# Patient Record
Sex: Female | Born: 1935
Health system: Southern US, Community
[De-identification: ages and names within clinical notes are randomized; demographics above are authoritative.]

## PROBLEM LIST (undated history)

## (undated) DIAGNOSIS — J45909 Unspecified asthma, uncomplicated: Secondary | ICD-10-CM

## (undated) DIAGNOSIS — T466X5A Adverse effect of antihyperlipidemic and antiarteriosclerotic drugs, initial encounter: Secondary | ICD-10-CM

## (undated) DIAGNOSIS — R0602 Shortness of breath: Secondary | ICD-10-CM

## (undated) DIAGNOSIS — J439 Emphysema, unspecified: Secondary | ICD-10-CM

## (undated) DIAGNOSIS — K219 Gastro-esophageal reflux disease without esophagitis: Secondary | ICD-10-CM

## (undated) DIAGNOSIS — N183 Chronic kidney disease, stage 3 unspecified: Secondary | ICD-10-CM

## (undated) DIAGNOSIS — K3184 Gastroparesis: Secondary | ICD-10-CM

## (undated) DIAGNOSIS — M791 Myalgia, unspecified site: Secondary | ICD-10-CM

## (undated) DIAGNOSIS — E7800 Pure hypercholesterolemia, unspecified: Secondary | ICD-10-CM

## (undated) DIAGNOSIS — Z8719 Personal history of other diseases of the digestive system: Secondary | ICD-10-CM

## (undated) DIAGNOSIS — E039 Hypothyroidism, unspecified: Secondary | ICD-10-CM

## (undated) DIAGNOSIS — J309 Allergic rhinitis, unspecified: Secondary | ICD-10-CM

## (undated) DIAGNOSIS — M1990 Unspecified osteoarthritis, unspecified site: Secondary | ICD-10-CM

## (undated) DIAGNOSIS — R0600 Dyspnea, unspecified: Secondary | ICD-10-CM

## (undated) DIAGNOSIS — J449 Chronic obstructive pulmonary disease, unspecified: Secondary | ICD-10-CM

## (undated) HISTORY — PX: SINUS EXPLORATION: SHX5214

## (undated) HISTORY — DX: Allergic rhinitis, unspecified: J30.9

## (undated) HISTORY — PX: ABDOMINAL HYSTERECTOMY: SHX81

## (undated) HISTORY — DX: Myalgia, unspecified site: M79.10

## (undated) HISTORY — DX: Adverse effect of antihyperlipidemic and antiarteriosclerotic drugs, initial encounter: T46.6X5A

## (undated) HISTORY — DX: Chronic obstructive pulmonary disease, unspecified: J44.9

## (undated) HISTORY — PX: TONSILLECTOMY: SUR1361

## (undated) HISTORY — DX: Dyspnea, unspecified: R06.00

---

## 2013-04-01 ENCOUNTER — Encounter (HOSPITAL_COMMUNITY): Payer: Self-pay | Admitting: Pharmacy Technician

## 2013-04-07 NOTE — H&P (Signed)
Valerie Kelley is an 79 y.o. female.    Chief Complaint: left shoulder pain  HPI: Pt is a 79 y.o. female complaining of left shoulder pain for multiple years. Pain had continually increased since the beginning. X-rays in the clinic show end-stage arthritic changes of the left shoulder. Pt has tried various conservative treatments which have failed to alleviate their symptoms, including shots and therapy. Various options are discussed with the patient. Risks, benefits and expectations were discussed with the patient. Patient understand the risks, benefits and expectations and wishes to proceed with surgery.   PCP:  No primary provider on file.  D/C Plans:  Home with HHPT  PMH: No past medical history on file.  PSH: No past surgical history on file.  Social History:  has no tobacco, alcohol, and drug history on file.  Allergies:  Allergies  Allergen Reactions  . Codeine Nausea And Vomiting  . Darvocet (Propoxyphene-Acetaminophen) Nausea And Vomiting  . Prednisone Swelling and Other (See Comments)    Face swells and constant headache  . Penicillins Rash    Medications: No current facility-administered medications for this encounter.   Current Outpatient Prescriptions  Medication Sig Dispense Refill  . acetaminophen (TYLENOL) 325 MG tablet Take 325 mg by mouth every evening. For pain      . albuterol (PROVENTIL HFA;VENTOLIN HFA) 108 (90 BASE) MCG/ACT inhaler Inhale 2 puffs into the lungs every 6 (six) hours as needed for wheezing. For wheezing      . Calcium Carbonate-Vitamin D (CALCIUM + D PO) Take 1 tablet by mouth 2 (two) times daily.      . cholecalciferol (VITAMIN D) 1000 UNITS tablet Take 1,000 Units by mouth daily.      Marland Kitchen estradiol (ESTRACE) 1 MG tablet Take 1 mg by mouth daily.      . fish oil-omega-3 fatty acids 1000 MG capsule Take 2 g by mouth daily.      . flintstones complete (FLINTSTONES) 60 MG chewable tablet Chew 1 tablet by mouth daily.      . Lecithin GRAN Take  15 mLs by mouth daily.      Marland Kitchen loratadine (CLARITIN) 10 MG tablet Take 10 mg by mouth daily.      . Multiple Vitamins-Minerals (EYE VITAMINS PO) Take 1 tablet by mouth daily. Vision Support Eye Vitamin      . OVER THE COUNTER MEDICATION Take 0.5 tablets by mouth daily as needed. For pain Medication:Curamin      . OVER THE COUNTER MEDICATION Take 1 tablet by mouth daily. Medication:Thytrophin      . Polyethyl Glycol-Propyl Glycol (SYSTANE OP) Place 1 drop into both eyes daily as needed. For dry/irritated eyes      . polyethylene glycol (MIRALAX / GLYCOLAX) packet Take 51 g by mouth daily. Takes 3 capfuls daily      . progesterone (PROMETRIUM) 100 MG capsule Take 100 mg by mouth daily.      . ranitidine (ZANTAC) 150 MG tablet Take 150 mg by mouth 2 (two) times daily.      Marland Kitchen tiotropium (SPIRIVA) 18 MCG inhalation capsule Place 18 mcg into inhaler and inhale daily.      . traZODone (DESYREL) 50 MG tablet Take 50 mg by mouth at bedtime.      . vitamin E 400 UNIT capsule Take 800 Units by mouth daily.        No results found for this or any previous visit (from the past 48 hour(s)). No results found.  ROS: Pain with  rom of the left upper extremity  Physical Exam: Alert and oriented 80 y.o. female in no acute distress Cranial nerves 2-12 intact Cervical spine: full rom with no tenderness, nv intact distally Chest: active breath sounds bilaterally, no wheeze rhonchi or rales Heart: regular rate and rhythm, no murmur Abd: non tender non distended with active bowel sounds Hip is stable with rom  Left upper extremity with limited rom and weakness due to cuff insufficiency nv intact distally No rashes or edema  Assessment/Plan Assessment: left shoulder rotator cuff insufficiency  Plan: Patient will undergo a left reverse total shoulder by Dr. Veverly Fells at [facility] Risks benefits and expectations were discussed with the patient. Patient understand risks, benefits and expectations and wishes  to proceed.

## 2013-04-09 ENCOUNTER — Encounter (HOSPITAL_COMMUNITY): Payer: Self-pay

## 2013-04-09 ENCOUNTER — Encounter (HOSPITAL_COMMUNITY)
Admission: RE | Admit: 2013-04-09 | Discharge: 2013-04-09 | Disposition: A | Discharge status: Home / Self Care | Payer: Medicare Other | Source: Ambulatory Visit | Attending: Orthopedic Surgery | Admitting: Orthopedic Surgery

## 2013-04-09 ENCOUNTER — Ambulatory Visit (HOSPITAL_COMMUNITY)
Admission: RE | Admit: 2013-04-09 | Discharge: 2013-04-09 | Disposition: A | Discharge status: Home / Self Care | Payer: Medicare Other | Source: Ambulatory Visit | Attending: Orthopedic Surgery | Admitting: Orthopedic Surgery

## 2013-04-09 DIAGNOSIS — Z01810 Encounter for preprocedural cardiovascular examination: Secondary | ICD-10-CM | POA: Insufficient documentation

## 2013-04-09 DIAGNOSIS — Z01812 Encounter for preprocedural laboratory examination: Secondary | ICD-10-CM | POA: Insufficient documentation

## 2013-04-09 DIAGNOSIS — J9819 Other pulmonary collapse: Secondary | ICD-10-CM | POA: Insufficient documentation

## 2013-04-09 DIAGNOSIS — J438 Other emphysema: Secondary | ICD-10-CM | POA: Insufficient documentation

## 2013-04-09 DIAGNOSIS — Z01818 Encounter for other preprocedural examination: Secondary | ICD-10-CM | POA: Insufficient documentation

## 2013-04-09 DIAGNOSIS — R0602 Shortness of breath: Secondary | ICD-10-CM | POA: Insufficient documentation

## 2013-04-09 DIAGNOSIS — Z0183 Encounter for blood typing: Secondary | ICD-10-CM | POA: Insufficient documentation

## 2013-04-09 HISTORY — DX: Gastroparesis: K31.84

## 2013-04-09 HISTORY — DX: Unspecified osteoarthritis, unspecified site: M19.90

## 2013-04-09 HISTORY — DX: Unspecified asthma, uncomplicated: J45.909

## 2013-04-09 LAB — CBC
MCV: 90.5 fL (ref 78.0–100.0)
Platelets: 212 10*3/uL (ref 150–400)
RBC: 4.32 MIL/uL (ref 3.87–5.11)
WBC: 7.8 10*3/uL (ref 4.0–10.5)

## 2013-04-09 LAB — BASIC METABOLIC PANEL
CO2: 23 mEq/L (ref 19–32)
Calcium: 9.5 mg/dL (ref 8.4–10.5)
Chloride: 104 mEq/L (ref 96–112)
Sodium: 137 mEq/L (ref 135–145)

## 2013-04-09 LAB — TYPE AND SCREEN: Antibody Screen: NEGATIVE

## 2013-04-09 LAB — ABO/RH: ABO/RH(D): A POS

## 2013-04-09 NOTE — Pre-Procedure Instructions (Signed)
Valerie Kelley  04/09/2013   Your procedure is scheduled on:  04/15/13  Report to [place] at 1025 AM.  Call this number if you have problems the morning of surgery: [phone]   Remember:   Do not eat food or drink liquids after midnight.   Take these medicines the morning of surgery with A SIP OF WATER: all inhalers,estrace,clartin,zantac   Do not wear jewelry, make-up or nail polish.  Do not wear lotions, powders, or perfumes. You may wear deodorant.  Do not shave 48 hours prior to surgery. Men may shave face and neck.  Do not bring valuables to the hospital.  [facility] is not responsible                   for any belongings or valuables.  Contacts, dentures or bridgework may not be worn into surgery.  Leave suitcase in the car. After surgery it may be brought to your room.  For patients admitted to the hospital, checkout time is 11:00 AM the day of  discharge.   Patients discharged the day of surgery will not be allowed to drive  home.  Name and phone number of your driver: family  Special Instructions: Shower using CHG 2 nights before surgery and the night before surgery.  If you shower the day of surgery use CHG.  Use special wash - you have one bottle of CHG for all showers.  You should use approximately 1/3 of the bottle for each shower.   Please read over the following fact sheets that you were given: Pain Booklet, Coughing and Deep Breathing, Blood Transfusion Information and Surgical Site Infection Prevention

## 2013-04-10 NOTE — Progress Notes (Signed)
Anesthesia Chart Review:  Patient is a 79 year old female scheduled for left shoulder reverse shoulder arthroplasty on 04/15/13 by Dr. Veverly Fells.  History includes non-smoker, asthma, chronic SOB, arthritis, CKD stage III, gastric paresis, hysterectomy, sinus surgery.  PCP is Dr. Quintella Reichert at [facility] in [place]. He medically cleared patient with recommendations for BP and renal function monitoring.  EKGs from 01/28/13 (PCP) and 04/09/13 noted that show NSR--question of short PR on her later EKG, but some artifact is present.  PR was 0.14 on 01/28/13.  Preoperative labs noted.  BUN 36 with normal Cr of 1.01.  CBC WNL.    CXR on 04/09/13 showed: COPD changes with atelectasis and question infiltrate right middle lobe. Potential nodular density in left upper lobe; follow-up radiographs to ensure resolution or CT imaging recommended to exclude pulmonary nodule. Currently, there are no comparison CXRs. I spoke with Santiago Glad at Dr. Veverly Fells' office who notified Doren Custard, PA-C to review for further recommendations. She was afebrile, normal WBC, with O2 sat 98% at PAT.  I was not asked to evaluate her during her PAT visit.  I will defer further recommendations for follow-up to Dr. Dewaine Conger.  Clinical correlation on the day of surgery.  If no worrisome symptoms to indicate acute PNA then I would anticipate that she could proceed.    George [facility]/Anesthesiology Phone [phone] 04/10/2013 4:35 PM

## 2013-04-14 MED ORDER — VANCOMYCIN HCL IN DEXTROSE 1-5 GM/200ML-% IV SOLN
1000.0000 mg | INTRAVENOUS | Status: AC
  Administered 2013-04-15: 1000 mg via INTRAVENOUS
  Filled 2013-04-14: qty 200

## 2013-04-15 ENCOUNTER — Inpatient Hospital Stay (HOSPITAL_COMMUNITY)
Admission: RE | Admit: 2013-04-15 | Discharge: 2013-04-17 | DRG: 484 | Disposition: A | Discharge status: Home / Self Care | Payer: Medicare Other | Source: Ambulatory Visit | Attending: Orthopedic Surgery | Admitting: Orthopedic Surgery

## 2013-04-15 ENCOUNTER — Encounter (HOSPITAL_COMMUNITY): Payer: Self-pay | Admitting: *Deleted

## 2013-04-15 ENCOUNTER — Encounter (HOSPITAL_COMMUNITY): Payer: Self-pay | Admitting: Vascular Surgery

## 2013-04-15 ENCOUNTER — Inpatient Hospital Stay (HOSPITAL_COMMUNITY): Payer: Medicare Other

## 2013-04-15 ENCOUNTER — Encounter (HOSPITAL_COMMUNITY)
Admission: RE | Disposition: A | Discharge status: Home / Self Care | Payer: Self-pay | Source: Ambulatory Visit | Attending: Orthopedic Surgery

## 2013-04-15 ENCOUNTER — Inpatient Hospital Stay (HOSPITAL_COMMUNITY): Payer: Medicare Other | Admitting: Certified Registered Nurse Anesthetist

## 2013-04-15 DIAGNOSIS — Z79899 Other long term (current) drug therapy: Secondary | ICD-10-CM

## 2013-04-15 DIAGNOSIS — M25312 Other instability, left shoulder: Secondary | ICD-10-CM

## 2013-04-15 DIAGNOSIS — S43429A Sprain of unspecified rotator cuff capsule, initial encounter: Principal | ICD-10-CM | POA: Diagnosis present

## 2013-04-15 DIAGNOSIS — Z880 Allergy status to penicillin: Secondary | ICD-10-CM

## 2013-04-15 DIAGNOSIS — R110 Nausea: Secondary | ICD-10-CM | POA: Diagnosis not present

## 2013-04-15 DIAGNOSIS — M19019 Primary osteoarthritis, unspecified shoulder: Secondary | ICD-10-CM | POA: Diagnosis present

## 2013-04-15 DIAGNOSIS — X58XXXA Exposure to other specified factors, initial encounter: Secondary | ICD-10-CM | POA: Diagnosis present

## 2013-04-15 HISTORY — DX: Hypothyroidism, unspecified: E03.9

## 2013-04-15 HISTORY — DX: Gastro-esophageal reflux disease without esophagitis: K21.9

## 2013-04-15 HISTORY — DX: Emphysema, unspecified: J43.9

## 2013-04-15 HISTORY — DX: Personal history of other diseases of the digestive system: Z87.19

## 2013-04-15 HISTORY — DX: Chronic kidney disease, stage 3 (moderate): N18.3

## 2013-04-15 HISTORY — DX: Pure hypercholesterolemia, unspecified: E78.00

## 2013-04-15 HISTORY — DX: Chronic kidney disease, stage 3 unspecified: N18.30

## 2013-04-15 HISTORY — DX: Shortness of breath: R06.02

## 2013-04-15 HISTORY — PX: REVERSE SHOULDER ARTHROPLASTY: SHX5054

## 2013-04-15 SURGERY — ARTHROPLASTY, SHOULDER, TOTAL, REVERSE
Anesthesia: General | Site: Shoulder | Laterality: Left | Wound class: Clean

## 2013-04-15 MED ORDER — LORATADINE 10 MG PO TABS
10.0000 mg | ORAL_TABLET | Freq: Every day | ORAL | Status: DC
  Administered 2013-04-16: 10 mg via ORAL
  Filled 2013-04-15 (×3): qty 1

## 2013-04-15 MED ORDER — HYDROCODONE-ACETAMINOPHEN 5-325 MG PO TABS
ORAL_TABLET | ORAL | Status: AC
  Filled 2013-04-15: qty 2

## 2013-04-15 MED ORDER — PHENYLEPHRINE HCL 10 MG/ML IJ SOLN
INTRAMUSCULAR | Status: DC | PRN
  Administered 2013-04-15 (×2): 80 ug via INTRAVENOUS

## 2013-04-15 MED ORDER — CHLORHEXIDINE GLUCONATE 4 % EX LIQD: 60.0000 mL | Freq: Once | CUTANEOUS | Status: DC

## 2013-04-15 MED ORDER — ONDANSETRON HCL 4 MG/2ML IJ SOLN
INTRAMUSCULAR | Status: DC | PRN
  Administered 2013-04-15: 4 mg via INTRAVENOUS

## 2013-04-15 MED ORDER — PHENYLEPHRINE HCL 10 MG/ML IJ SOLN
10.0000 mg | INTRAVENOUS | Status: DC | PRN
  Administered 2013-04-15: 25 ug/min via INTRAVENOUS

## 2013-04-15 MED ORDER — WHITE PETROLATUM GEL
Status: AC
  Administered 2013-04-15: 17:00:00
  Filled 2013-04-15: qty 5

## 2013-04-15 MED ORDER — PROGESTERONE MICRONIZED 100 MG PO CAPS
100.0000 mg | ORAL_CAPSULE | Freq: Every day | ORAL | Status: DC
  Filled 2013-04-15 (×3): qty 1

## 2013-04-15 MED ORDER — BUPIVACAINE-EPINEPHRINE 0.25% -1:200000 IJ SOLN
INTRAMUSCULAR | Status: DC | PRN
  Administered 2013-04-15: 5 mL

## 2013-04-15 MED ORDER — FAMOTIDINE 20 MG PO TABS
20.0000 mg | ORAL_TABLET | Freq: Every day | ORAL | Status: DC
  Administered 2013-04-16 – 2013-04-17 (×2): 20 mg via ORAL
  Filled 2013-04-15 (×3): qty 1

## 2013-04-15 MED ORDER — ARTIFICIAL TEARS OP OINT
TOPICAL_OINTMENT | OPHTHALMIC | Status: DC | PRN
  Administered 2013-04-15: 1 via OPHTHALMIC

## 2013-04-15 MED ORDER — METOCLOPRAMIDE HCL 10 MG PO TABS: 5.0000 mg | ORAL_TABLET | Freq: Three times a day (TID) | ORAL | Status: DC | PRN

## 2013-04-15 MED ORDER — ONDANSETRON HCL 4 MG/2ML IJ SOLN
INTRAMUSCULAR | Status: AC
  Filled 2013-04-15: qty 2

## 2013-04-15 MED ORDER — ESTRADIOL 1 MG PO TABS
1.0000 mg | ORAL_TABLET | Freq: Every day | ORAL | Status: DC
  Filled 2013-04-15 (×3): qty 1

## 2013-04-15 MED ORDER — MORPHINE SULFATE 2 MG/ML IJ SOLN
1.0000 mg | INTRAMUSCULAR | Status: DC | PRN
  Administered 2013-04-15 – 2013-04-16 (×4): 1 mg via INTRAVENOUS
  Administered 2013-04-16: 2 mg via INTRAVENOUS
  Administered 2013-04-16: 1 mg via INTRAVENOUS
  Administered 2013-04-16: 2 mg via INTRAVENOUS
  Administered 2013-04-16: 1 mg via INTRAVENOUS
  Filled 2013-04-15 (×8): qty 1

## 2013-04-15 MED ORDER — HYDROCODONE-ACETAMINOPHEN 5-325 MG PO TABS
1.0000 | ORAL_TABLET | ORAL | Status: DC | PRN
  Administered 2013-04-15: 2 via ORAL
  Administered 2013-04-16: 1 via ORAL
  Administered 2013-04-16 – 2013-04-17 (×4): 2 via ORAL
  Filled 2013-04-15 (×5): qty 2

## 2013-04-15 MED ORDER — FENTANYL CITRATE 0.05 MG/ML IJ SOLN
INTRAMUSCULAR | Status: AC
  Administered 2013-04-15: 50 ug via INTRAVENOUS
  Filled 2013-04-15: qty 2

## 2013-04-15 MED ORDER — GLYCOPYRROLATE 0.2 MG/ML IJ SOLN
INTRAMUSCULAR | Status: DC | PRN
  Administered 2013-04-15: .6 mg via INTRAVENOUS

## 2013-04-15 MED ORDER — ONDANSETRON HCL 4 MG/2ML IJ SOLN
INTRAMUSCULAR | Status: AC
  Administered 2013-04-15: 4 mg
  Filled 2013-04-15: qty 2

## 2013-04-15 MED ORDER — LACTATED RINGERS IV SOLN
INTRAVENOUS | Status: DC
  Administered 2013-04-15: 11:00:00 via INTRAVENOUS

## 2013-04-15 MED ORDER — ONDANSETRON HCL 4 MG/2ML IJ SOLN
4.0000 mg | Freq: Once | INTRAMUSCULAR | Status: AC | PRN
  Administered 2013-04-15: 4 mg via INTRAVENOUS

## 2013-04-15 MED ORDER — MIDAZOLAM HCL 2 MG/2ML IJ SOLN
INTRAMUSCULAR | Status: AC
  Administered 2013-04-15: 1 mg via INTRAVENOUS
  Filled 2013-04-15: qty 2

## 2013-04-15 MED ORDER — TIOTROPIUM BROMIDE MONOHYDRATE 18 MCG IN CAPS
18.0000 ug | ORAL_CAPSULE | Freq: Every day | RESPIRATORY_TRACT | Status: DC
  Administered 2013-04-16: 18 ug via RESPIRATORY_TRACT
  Filled 2013-04-15: qty 5

## 2013-04-15 MED ORDER — 0.9 % SODIUM CHLORIDE (POUR BTL) OPTIME
TOPICAL | Status: DC | PRN
  Administered 2013-04-15: 1000 mL

## 2013-04-15 MED ORDER — HYDROMORPHONE HCL PF 1 MG/ML IJ SOLN
INTRAMUSCULAR | Status: AC
  Filled 2013-04-15: qty 1

## 2013-04-15 MED ORDER — METOCLOPRAMIDE HCL 5 MG/ML IJ SOLN
5.0000 mg | Freq: Three times a day (TID) | INTRAMUSCULAR | Status: DC | PRN
  Administered 2013-04-15: 10 mg via INTRAVENOUS
  Filled 2013-04-15: qty 2

## 2013-04-15 MED ORDER — TRAZODONE HCL 50 MG PO TABS
50.0000 mg | ORAL_TABLET | Freq: Every day | ORAL | Status: DC
  Filled 2013-04-15 (×3): qty 1

## 2013-04-15 MED ORDER — MIDAZOLAM HCL 2 MG/2ML IJ SOLN: 1.0000 mg | Freq: Once | INTRAMUSCULAR | Status: AC

## 2013-04-15 MED ORDER — PROPOFOL 10 MG/ML IV BOLUS
INTRAVENOUS | Status: DC | PRN
  Administered 2013-04-15: 100 mg via INTRAVENOUS

## 2013-04-15 MED ORDER — SODIUM CHLORIDE 0.9 % IV SOLN
INTRAVENOUS | Status: DC
  Administered 2013-04-15 – 2013-04-16 (×2): via INTRAVENOUS

## 2013-04-15 MED ORDER — ACETAMINOPHEN 325 MG PO TABS: 325.0000 mg | ORAL_TABLET | Freq: Every evening | ORAL | Status: DC

## 2013-04-15 MED ORDER — ONDANSETRON HCL 4 MG PO TABS: 4.0000 mg | ORAL_TABLET | Freq: Four times a day (QID) | ORAL | Status: DC | PRN

## 2013-04-15 MED ORDER — FENTANYL CITRATE 0.05 MG/ML IJ SOLN: 50.0000 ug | Freq: Once | INTRAMUSCULAR | Status: AC

## 2013-04-15 MED ORDER — LIDOCAINE HCL (CARDIAC) 20 MG/ML IV SOLN
INTRAVENOUS | Status: DC | PRN
  Administered 2013-04-15: 40 mg via INTRAVENOUS

## 2013-04-15 MED ORDER — PHENOL 1.4 % MT LIQD: 1.0000 | OROMUCOSAL | Status: DC | PRN

## 2013-04-15 MED ORDER — ONDANSETRON HCL 4 MG/2ML IJ SOLN
4.0000 mg | Freq: Four times a day (QID) | INTRAMUSCULAR | Status: DC | PRN
  Administered 2013-04-15 – 2013-04-17 (×5): 4 mg via INTRAVENOUS
  Filled 2013-04-15 (×6): qty 2

## 2013-04-15 MED ORDER — LACTATED RINGERS IV SOLN
INTRAVENOUS | Status: DC | PRN
  Administered 2013-04-15 (×2): via INTRAVENOUS

## 2013-04-15 MED ORDER — POLYETHYLENE GLYCOL 3350 17 G PO PACK
51.0000 g | PACK | Freq: Every day | ORAL | Status: DC
  Filled 2013-04-15 (×3): qty 3

## 2013-04-15 MED ORDER — FENTANYL CITRATE 0.05 MG/ML IJ SOLN
INTRAMUSCULAR | Status: DC | PRN
  Administered 2013-04-15: 100 ug via INTRAVENOUS

## 2013-04-15 MED ORDER — VITAMIN D3 25 MCG (1000 UNIT) PO TABS
1000.0000 [IU] | ORAL_TABLET | Freq: Every day | ORAL | Status: DC
  Filled 2013-04-15 (×3): qty 1

## 2013-04-15 MED ORDER — ALBUTEROL SULFATE HFA 108 (90 BASE) MCG/ACT IN AERS
2.0000 | INHALATION_SPRAY | Freq: Four times a day (QID) | RESPIRATORY_TRACT | Status: DC | PRN
  Administered 2013-04-16: 2 via RESPIRATORY_TRACT
  Filled 2013-04-15: qty 6.7

## 2013-04-15 MED ORDER — ACETAMINOPHEN 650 MG RE SUPP: 650.0000 mg | Freq: Four times a day (QID) | RECTAL | Status: DC | PRN

## 2013-04-15 MED ORDER — NEOSTIGMINE METHYLSULFATE 1 MG/ML IJ SOLN
INTRAMUSCULAR | Status: DC | PRN
  Administered 2013-04-15: 3 mg via INTRAVENOUS

## 2013-04-15 MED ORDER — ROCURONIUM BROMIDE 100 MG/10ML IV SOLN
INTRAVENOUS | Status: DC | PRN
  Administered 2013-04-15: 30 mg via INTRAVENOUS

## 2013-04-15 MED ORDER — ACETAMINOPHEN 325 MG PO TABS: 650.0000 mg | ORAL_TABLET | Freq: Four times a day (QID) | ORAL | Status: DC | PRN

## 2013-04-15 MED ORDER — HYDROMORPHONE HCL PF 1 MG/ML IJ SOLN
0.2500 mg | INTRAMUSCULAR | Status: DC | PRN
  Administered 2013-04-15 (×2): 0.5 mg via INTRAVENOUS

## 2013-04-15 MED ORDER — SODIUM CHLORIDE 0.9 % IV SOLN
500.0000 mg | INTRAVENOUS | Status: AC
  Administered 2013-04-15: 500 mg via INTRAVENOUS
  Filled 2013-04-15: qty 500

## 2013-04-15 MED ORDER — MENTHOL 3 MG MT LOZG: 1.0000 | LOZENGE | OROMUCOSAL | Status: DC | PRN

## 2013-04-15 MED ORDER — VITAMIN E 180 MG (400 UNIT) PO CAPS
800.0000 [IU] | ORAL_CAPSULE | Freq: Every day | ORAL | Status: DC
  Filled 2013-04-15 (×3): qty 2

## 2013-04-15 MED ORDER — LIDOCAINE HCL 4 % MT SOLN
OROMUCOSAL | Status: DC | PRN
  Administered 2013-04-15: 4 mL via TOPICAL

## 2013-04-15 MED ORDER — BUPIVACAINE-EPINEPHRINE PF 0.25-1:200000 % IJ SOLN
INTRAMUSCULAR | Status: AC
  Filled 2013-04-15: qty 30

## 2013-04-15 SURGICAL SUPPLY — 57 items
BLADE SAG 18X100X1.27 (BLADE) ×2 IMPLANT
CAPT SHOULD DELTAXTEND CEM MOD ×2 IMPLANT
CLOTH BEACON ORANGE TIMEOUT ST (SAFETY) ×2 IMPLANT
CLSR STERI-STRIP ANTIMIC 1/2X4 (GAUZE/BANDAGES/DRESSINGS) ×2 IMPLANT
COVER SURGICAL LIGHT HANDLE (MISCELLANEOUS) ×2 IMPLANT
DRAPE INCISE IOBAN 66X45 STRL (DRAPES) ×6 IMPLANT
DRAPE U-SHAPE 47X51 STRL (DRAPES) ×2 IMPLANT
DRAPE X-RAY CASS 24X20 (DRAPES) IMPLANT
DRSG ADAPTIC 3X8 NADH LF (GAUZE/BANDAGES/DRESSINGS) ×2 IMPLANT
DRSG PAD ABDOMINAL 8X10 ST (GAUZE/BANDAGES/DRESSINGS) ×2 IMPLANT
DURAPREP 26ML APPLICATOR (WOUND CARE) ×2 IMPLANT
ELECT BLADE 4.0 EZ CLEAN MEGAD (MISCELLANEOUS) ×2
ELECT NEEDLE TIP 2.8 STRL (NEEDLE) ×2 IMPLANT
ELECT REM PT RETURN 9FT ADLT (ELECTROSURGICAL) ×2
ELECTRODE BLDE 4.0 EZ CLN MEGD (MISCELLANEOUS) ×1 IMPLANT
ELECTRODE REM PT RTRN 9FT ADLT (ELECTROSURGICAL) ×1 IMPLANT
GLOVE BIOGEL PI ORTHO PRO 7.5 (GLOVE) ×1
GLOVE BIOGEL PI ORTHO PRO SZ8 (GLOVE) ×1
GLOVE ORTHO TXT STRL SZ7.5 (GLOVE) ×2 IMPLANT
GLOVE PI ORTHO PRO STRL 7.5 (GLOVE) ×1 IMPLANT
GLOVE PI ORTHO PRO STRL SZ8 (GLOVE) ×1 IMPLANT
GLOVE SURG ORTHO 8.5 STRL (GLOVE) ×2 IMPLANT
GOWN STRL NON-REIN LRG LVL3 (GOWN DISPOSABLE) ×2 IMPLANT
GOWN STRL REIN XL XLG (GOWN DISPOSABLE) ×4 IMPLANT
HANDPIECE INTERPULSE COAX TIP (DISPOSABLE)
KIT BASIN OR (CUSTOM PROCEDURE TRAY) ×2 IMPLANT
KIT ROOM TURNOVER OR (KITS) ×2 IMPLANT
MANIFOLD NEPTUNE II (INSTRUMENTS) ×2 IMPLANT
NEEDLE 1/2 CIR MAYO (NEEDLE) ×2 IMPLANT
NEEDLE HYPO 25GX1X1/2 BEV (NEEDLE) ×2 IMPLANT
NS IRRIG 1000ML POUR BTL (IV SOLUTION) ×2 IMPLANT
PACK SHOULDER (CUSTOM PROCEDURE TRAY) ×2 IMPLANT
PAD ARMBOARD 7.5X6 YLW CONV (MISCELLANEOUS) ×4 IMPLANT
SET HNDPC FAN SPRY TIP SCT (DISPOSABLE) IMPLANT
SLING ARM FOAM STRAP LRG (SOFTGOODS) IMPLANT
SLING ARM FOAM STRAP MED (SOFTGOODS) IMPLANT
SLING ARM IMMOBILIZER MED (SOFTGOODS) ×2 IMPLANT
SPONGE GAUZE 4X4 12PLY (GAUZE/BANDAGES/DRESSINGS) ×2 IMPLANT
SPONGE LAP 18X18 X RAY DECT (DISPOSABLE) ×2 IMPLANT
SPONGE LAP 4X18 X RAY DECT (DISPOSABLE) IMPLANT
STRIP CLOSURE SKIN 1/2X4 (GAUZE/BANDAGES/DRESSINGS) ×2 IMPLANT
SUCTION FRAZIER TIP 10 FR DISP (SUCTIONS) ×2 IMPLANT
SUT FIBERWIRE #2 38 T-5 BLUE (SUTURE) ×2
SUT MNCRL AB 4-0 PS2 18 (SUTURE) ×2 IMPLANT
SUT VIC AB 2-0 CT1 27 (SUTURE)
SUT VIC AB 2-0 CT1 TAPERPNT 27 (SUTURE) IMPLANT
SUT VICRYL 0 CT 1 36IN (SUTURE) ×4 IMPLANT
SUTURE FIBERWR #2 38 T-5 BLUE (SUTURE) ×1 IMPLANT
SWABSTICK BENZOIN STERILE (MISCELLANEOUS) ×2 IMPLANT
SYR CONTROL 10ML LL (SYRINGE) ×2 IMPLANT
TAPE PAPER 3X10 WHT MICROPORE (GAUZE/BANDAGES/DRESSINGS) ×2 IMPLANT
TOWEL OR 17X24 6PK STRL BLUE (TOWEL DISPOSABLE) ×2 IMPLANT
TOWEL OR 17X26 10 PK STRL BLUE (TOWEL DISPOSABLE) ×2 IMPLANT
TOWER CARTRIDGE SMART MIX (DISPOSABLE) IMPLANT
TRAY FOLEY CATH 16FRSI W/METER (SET/KITS/TRAYS/PACK) ×2 IMPLANT
WATER STERILE IRR 1000ML POUR (IV SOLUTION) ×2 IMPLANT
YANKAUER SUCT BULB TIP NO VENT (SUCTIONS) IMPLANT

## 2013-04-15 NOTE — Progress Notes (Signed)
UR COMPLETED  

## 2013-04-15 NOTE — Brief Op Note (Signed)
04/15/2013  2:23 PM  PATIENT:  [name]  79 y.o. female  PRE-OPERATIVE DIAGNOSIS:  LEFT SHOULDER OSTEOARTHRITIS, RCT ARTHROPATHY  POST-OPERATIVE DIAGNOSIS:  LEFT SHOULDER OSTEOARTHRITIS, RCT ARTHROPATHY  PROCEDURE:  Procedure(s): REVERSE SHOULDER ARTHROPLASTY LEFT (Left) DePuy Delta Xtend  SURGEON:  Surgeon(s) and Role:    * Augustin Schooling, MD - Primary  PHYSICIAN ASSISTANT:   ASSISTANTS: thomas b dixon, PA-C   ANESTHESIA:   regional and general  EBL:     BLOOD ADMINISTERED:none  DRAINS: none   LOCAL MEDICATIONS USED:  MARCAINE     SPECIMEN:  No Specimen  DISPOSITION OF SPECIMEN:  N/A  COUNTS:  YES  TOURNIQUET:  * No tourniquets in log *  DICTATION: .Other Dictation: Dictation Number [id number]  PLAN OF CARE: Admit to inpatient   PATIENT DISPOSITION:  PACU - hemodynamically stable.   Delay start of Pharmacological VTE agent (>24hrs) due to surgical blood loss or risk of bleeding: not applicable

## 2013-04-15 NOTE — Anesthesia Preprocedure Evaluation (Signed)
Anesthesia Evaluation  Patient identified by MRN, date of birth, ID band Patient awake    Reviewed: Allergy & Precautions, H&P , NPO status   Airway Mallampati: II      Dental   Pulmonary  breath sounds clear to auscultation        Cardiovascular Rhythm:Regular Rate:Normal     Neuro/Psych    GI/Hepatic   Endo/Other    Renal/GU      Musculoskeletal   Abdominal   Peds  Hematology   Anesthesia Other Findings   Reproductive/Obstetrics                           Anesthesia Physical Anesthesia Plan  ASA: II  Anesthesia Plan: General   Post-op Pain Management:    Induction: Intravenous  Airway Management Planned: Oral ETT  Additional Equipment:   Intra-op Plan:   Post-operative Plan: Extubation in OR  Informed Consent: I have reviewed the patients History and Physical, chart, labs and discussed the procedure including the risks, benefits and alternatives for the proposed anesthesia with the patient or authorized representative who has indicated his/her understanding and acceptance.   Dental advisory given  Plan Discussed with: CRNA and Anesthesiologist  Anesthesia Plan Comments: (Tear L. Rotator cuff Mild asthma Gastroparesis with dumping symptoms  Plan GA with interscalene block  Roberts Gaudy, MD)        Anesthesia Quick Evaluation

## 2013-04-15 NOTE — Anesthesia Postprocedure Evaluation (Signed)
Anesthesia Post Note  Patient: Valerie Kelley  Procedure(s) Performed: Procedure(s) (LRB): REVERSE SHOULDER ARTHROPLASTY LEFT (Left)  Anesthesia type: General  Patient location: PACU  Post pain: Pain level controlled  Post assessment: Patient's Cardiovascular Status Stable  Last Vitals:  Filed Vitals:   04/15/13 1528  BP:   Pulse: 64  Temp: 36.3 C  Resp: 17    Post vital signs: Reviewed and stable  Level of consciousness: alert  Complications: No apparent anesthesia complications

## 2013-04-15 NOTE — Op Note (Signed)
NAMEDEEYA, REDIFER NO.:  [id number]  MEDICAL RECORD NO.:  [id number]  LOCATION:  [id number]                        FACILITY:  [facility]  PHYSICIAN:  Doran Heater. Veverly Fells, M.D. DATE OF BIRTH:  09/13/1934  DATE OF PROCEDURE:  04/15/2013 DATE OF DISCHARGE:                              OPERATIVE REPORT   PREOPERATIVE DIAGNOSIS:  Left shoulder end-stage osteoarthritis/rotator cuff tear arthropathy.  POSTOPERATIVE DIAGNOSIS:  Left shoulder end-stage osteoarthritis/rotator cuff tear arthropathy.  PROCEDURE PERFORMED:  Left reverse total shoulder arthroplasty, DePuy Delta Xtend prosthesis.  ATTENDING SURGEON:  Doran Heater. Veverly Fells, M.D.  ASSISTANT:  Abbott Pao. Dixon, PA-C, who was scrubbed the entire procedure and necessary for satisfactory completion of surgery.  ANESTHESIA:  General anesthesia was used plus interscalene block.  ESTIMATED BLOOD LOSS:  Minimal.  FLUID REPLACEMENT:  1200 mL crystalloids.  INSTRUMENT COUNTS:  Correct.  COMPLICATIONS:  There were no complications.  ANTIBIOTICS:  Perioperative antibiotics were given.  INDICATIONS:  The patient is a 79 year old female with worsening left shoulder pain and poor function secondary to rotator cuff tear arthropathy.  The patient has poor function and pain with daily activities.  The patient has had pain despite conservative management. Desires operative treatment.  We discussed options including reverse shoulder arthroplasty, which she would like to proceed, with that surgery to relieve pain in her shoulder and to restore some function of that arm as well.  Informed consent was obtained.  DESCRIPTION OF PROCEDURE:  After an adequate level of anesthesia achieved, the patient was positioned in the modified beach-chair position.  Left shoulder was correctly identified and the right arm was placed on a prep stand and padded appropriately.  After sterile prep and drape of the left shoulder, time-out was called.  We  entered the shoulder using standard deltopectoral incision started at the coracoid process extending down to the anterior humerus.  Dissection down through the subcutaneous tissues using Bovie and identified cephalic vein, took it laterally over the deltoid, pectoralis was taken medially.  Conjoined tendon identified and retracted medially.  The subscapularis was released off the lesser tuberosity and tagged for repair.  We then progressively released the capsule off the inferior humeral neck.  We released the biceps tendon and externally rotating the humerus and then internally rotating the humerus to release the remaining remnant of the sliver of supraspinatus tendon.  There was really no supraspinatus or infraspinatus attachment.  Teres minor appeared to be intact in the back and not significantly contracted.  So, we left that.  We were able to deliver the humerus out of the wound.  We first identified inter medullary canal, drilling initially with a 6 reamer and then up to a 10 reamer.  We then went ahead and did our humeral cut off of the intramedullary guide, set on 10 degrees of retroversion.  Once we had done that, we went ahead and prepared our proximal humerus with reaming for size 1 on the left and then we went ahead and once we had that reamed out, we placed our trial component, which fit nicely.  We then retracted the humerus posteriorly, did release the subscapularis, had balanced nicely, did a capsulectomy anterior-inferiorly protecting the  axillary nerve which was very visible.  Did a posterior capsular release.  We removed the remaining biceps tendon and superior-labral complex.  The cartilage remaining on the joint removed using curette. We then found the center point for the glenoid and then drilled the guidepin.  We then reamed for the metaglene and did use peripheral handheld reamer, cut down a nice bleeding bone, drilled our central hole for the metaglene and then  impacted the metaglene in position, with care taken towards the inferior 6 o'clock and superior 12 o'clock position. We referenced off the inferior scapular body.  Next, we placed our two locking screws, one inferiorly to 48-mm length and one proximally at the base of the coracoid 36-mm length.  Due to the patient's tiny glenoid, the screw hole anteriorly and posteriorly overlapped the edge of the glenoid and we felt like we did not get any purchase, there may have been fracture of the bone and destabilized the component.  It was nice and stable.  We locked those two screws, placed our 38 standard glenosphere in place and tied that in position, impacting with the impactor and then tightening again.  We were happy with that glenosphere position.  The axillary nerve was out of the metaglene glenosphere construct.  We then went ahead and reduced the shoulder with a +3 component.  We were happy with the trial, removed the trial humeral component.  We thoroughly irrigated the humerus.  We drilled holes and passed sutures #2 FiberWire for repair of the subscap.  We then did impaction grafting with available bone graft in the humeral head with the hydroxyapatite coated 10 stem, size 1 left metaphyseal modular component.  With that in place, we placed a real +3 38 poly, reduced the shoulder, happy again with soft tissue balancing, negative sulcus, nice and tight conjoined and no external rotation gapping.  Next, we checked our axillary nerve was in good shape.  We thoroughly irrigated the shoulder and then we went ahead and repaired the subscap as mentioned anatomically, that will add persistent stability as well postoperatively.  We next irrigated again and repaired the deltopectoral interval with 0 Vicryl suture followed by 2-0 Vicryl in the subcutaneous closure and 4-0 Monocryl for skin.  Steri-Strips applied followed by sterile dressing.  The patient tolerated the surgery well.     Doran Heater. Veverly Fells, M.D.     SRN/MEDQ  D:  04/15/2013  T:  04/15/2013  Job:  [id number]

## 2013-04-15 NOTE — Anesthesia Procedure Notes (Addendum)
Anesthesia Regional Block:  Interscalene brachial plexus block  Pre-Anesthetic Checklist: ,, timeout performed, Correct Patient, Correct Site, Correct Laterality, Correct Procedure, Correct Position, site marked, Risks and benefits discussed,  Surgical consent,  Pre-op evaluation,  At surgeon's request and post-op pain management  Laterality: Left  Prep: chloraprep       Needles:   Needle Type: Echogenic Stimulator Needle      Needle Gauge: 22 and 22 G    Additional Needles:  Procedures: ultrasound guided (picture in chart) and nerve stimulator Interscalene brachial plexus block Narrative:  Start time: 04/15/2013 12:10 PM End time: 04/15/2013 12:15 PM Injection made incrementally with aspirations every 5 mL.  Performed by: Personally   Additional Notes: 15 cc 0.5% marcaine 1:200 Epi injected easily  Roberts Gaudy, MD   Procedure Name: Intubation Date/Time: 04/15/2013 12:58 PM Performed by: Trixie Deis A Pre-anesthesia Checklist: Patient identified, Timeout performed, Emergency Drugs available, Suction available and Patient being monitored Patient Re-evaluated:Patient Re-evaluated prior to inductionOxygen Delivery Method: Circle system utilized Preoxygenation: Pre-oxygenation with 100% oxygen Intubation Type: IV induction Ventilation: Mask ventilation without difficulty and Oral airway inserted - appropriate to patient size Laryngoscope Size: Mac and 3 Grade View: Grade I Tube type: Oral Tube size: 7.0 mm Number of attempts: 1 Airway Equipment and Method: LTA kit utilized and Stylet Placement Confirmation: ETT inserted through vocal cords under direct vision,  breath sounds checked- equal and bilateral and positive ETCO2 Secured at: 20 cm Tube secured with: Tape Dental Injury: Teeth and Oropharynx as per pre-operative assessment

## 2013-04-15 NOTE — Transfer of Care (Signed)
2Immediate Anesthesia Transfer of Care Note  Patient: Valerie Kelley  Procedure(s) Performed: Procedure(s): REVERSE SHOULDER ARTHROPLASTY LEFT (Left)  Patient Location: PACU  Anesthesia Type:General  Level of Consciousness: awake and oriented  Airway & Oxygen Therapy: Patient Spontanous Breathing and Patient connected to face mask oxygen  Post-op Assessment: Report given to PACU RN, Post -op Vital signs reviewed and stable and Patient moving all extremities  Post vital signs: Reviewed and stable  Complications: No apparent anesthesia complications

## 2013-04-15 NOTE — Interval H&P Note (Signed)
History and Physical Interval Note:  04/15/2013 12:40 PM  Valerie Kelley  has presented today for surgery, with the diagnosis of LEFT SHOULDER OA  The various methods of treatment have been discussed with the patient and family. After consideration of risks, benefits and other options for treatment, the patient has consented to  Procedure(s): REVERSE SHOULDER ARTHROPLASTY LEFT (Left) as a surgical intervention .  The patient's history has been reviewed, patient examined, no change in status, stable for surgery.  I have reviewed the patient's chart and labs.  Questions were answered to the patient's satisfaction.     Shalen Petrak,STEVEN R

## 2013-04-16 ENCOUNTER — Encounter (HOSPITAL_COMMUNITY): Payer: Self-pay | Admitting: Orthopedic Surgery

## 2013-04-16 LAB — HEMOGLOBIN AND HEMATOCRIT, BLOOD: HCT: 29.5 % — ABNORMAL LOW (ref 36.0–46.0)

## 2013-04-16 LAB — BASIC METABOLIC PANEL
CO2: 24 mEq/L (ref 19–32)
Chloride: 106 mEq/L (ref 96–112)
Sodium: 136 mEq/L (ref 135–145)

## 2013-04-16 NOTE — Progress Notes (Signed)
   Subjective: 1 Day Post-Op Procedure(s) (LRB): REVERSE SHOULDER ARTHROPLASTY LEFT (Left)  Pt c/o moderate pain to left anterior shoulder this morning Decreased appetite  Patient reports pain as moderate.  Objective:   VITALS:   Filed Vitals:   04/15/13 2200  BP: 118/60  Pulse: 73  Temp: 98.1 F (36.7 C)  Resp: 18    Left shoulder dressing intact Sling intact nv intact distally  LABS  Recent Labs  04/16/13 0520  HGB 10.3*  HCT 29.5*     Recent Labs  04/16/13 0520  NA 136  K 4.1  BUN 17  CREATININE 0.93  GLUCOSE 95     Assessment/Plan: 1 Day Post-Op Procedure(s) (LRB): REVERSE SHOULDER ARTHROPLASTY LEFT (Left)  PT/OT as able Pain control Plan for d/c tomorrow afternoon Family in agreement   [place], MPAS, PA-C  04/16/2013, 8:22 AM

## 2013-04-16 NOTE — Progress Notes (Signed)
PT Cancellation Note  Patient Details Name: Valerie Kelley MRN: [id number] DOB: 02-08-1934   Cancelled Treatment:    Reason Eval/Treat Not Completed: PT screened, no needs identified, will sign off. Spoke with family; pt refusing PT at this time. Feels as though she is moving around "fine". Per OT, pt is min guard (A) for transfers, will have 24/7 (A) upon D/C. Encouraged family to be aware of balance deficits when returning home, if pt were to have any balance issues to contact MD and consult possible OPPT. Family verbalized understanding. Will sign off on pt at this time.    [place], [place] 04/16/2013, 1:08 PM

## 2013-04-16 NOTE — Evaluation (Signed)
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 Tolerated AROM elbow to hand in standing and sling education in sitting.  Reviewed ADL.  Educated pt and daughter in bed and chair position. Will continue to follow.    OT Assessment  Patient needs continued OT Services    Follow Up Recommendations  Supervision/Assistance - 24 hour;No OT follow up    Barriers to Discharge      Equipment Recommendations       Recommendations for Other Services    Frequency  Min 3X/week    Precautions / Restrictions Precautions Precautions: Fall;Shoulder Type of Shoulder Precautions: ADL, sling ed, pendulums, AROM elbow to hand, AAROM L shoulder per Dr. Veverly Fells protocol. Shoulder Interventions: Shoulder sling/immobilizer;At all times;Off for dressing/bathing/exercises Precaution Booklet Issued: Yes (comment)   Pertinent Vitals/Pain Did not rate, repositioned, iced    ADL  Eating/Feeding: Set up Where Assessed - Eating/Feeding: Bed level Grooming: Wash/dry hands;Moderate assistance Where Assessed - Grooming: Unsupported sitting Upper Body Bathing: Moderate assistance Where Assessed - Upper Body Bathing: Unsupported sitting Lower Body Bathing: Moderate assistance Where Assessed - Lower Body Bathing: Unsupported sitting;Supported sit to stand Upper Body Dressing: Moderate assistance Where Assessed - Upper Body Dressing: Unsupported sitting Lower Body Dressing: Moderate assistance Where Assessed - Lower Body Dressing: Unsupported sitting;Supported sit to stand Toilet Transfer: Min Psychiatric nurse Method: Arts development officer: Therapist, occupational and Hygiene: Min  guard Where Assessed - Best boy and Hygiene: Sit to stand from 3-in-1 or toilet Transfers/Ambulation Related to ADLs: did not ambulate with pt, pt declined stating she felt too weak ADL Comments: Instructed pt and daughter in hemi techniques for ADL, bed and chair positioning, donning and doffing sling, and AROM elbow to hand.  Pt unable to complete pendulums due to weakness in standing.  Left handout of Dr. Veverly Fells' AAROM exercise in room.    OT Diagnosis: Generalized weakness;Acute pain  OT Problem List: Decreased strength;Decreased range of motion;Decreased activity tolerance;Impaired balance (sitting and/or standing);Decreased knowledge of use of DME or AE;Decreased knowledge of precautions;Impaired UE functional use;Pain OT Treatment Interventions: Self-care/ADL training;Therapeutic exercise;DME and/or AE instruction;Therapeutic activities;Patient/family education   OT Goals(Current goals can be found in the care plan section) Acute Rehab OT Goals Patient Stated Goal: Home with daughter. OT Goal Formulation: With patient Time For Goal Achievement: 04/23/13 Potential to Achieve Goals: Good ADL Goals Pt Will Perform Upper Body Bathing: with modified independence;sitting Pt Will Perform Lower Body Bathing: with modified independence;sit to/from stand Pt Will Perform Upper Body Dressing: with modified independence;sitting Pt Will Perform Lower Body Dressing: with modified independence;sit to/from stand Pt Will Transfer to Toilet: Independently;regular height toilet Additional ADL Goal #1: Pt and family will be independent in donning and doffing sling. Additional ADL Goal #2: Pt and family will be independent in positioning L UE in bed and chair.  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       Prior [place] expects to be discharged to:: Private  residence Living Arrangements: Children Available Help at Discharge: Family;Available 24 hours/day Type of Home: House Home Layout: Two level Alternate  Level Stairs-Number of Steps: 1 flight Home Equipment: None Prior Function Level of Independence: Independent Communication Communication: No difficulties Dominant Hand: Right         Vision/Perception Vision - History Patient Visual Report: No change from baseline   Cognition  Cognition Arousal/Alertness: Awake/alert Behavior During Therapy: Anxious Overall Cognitive Status: Within Functional Limits for tasks assessed    Extremity/Trunk Assessment Upper Extremity Assessment Upper Extremity Assessment: LUE deficits/detail LUE Deficits / Details: s/p surgery LUE: Unable to fully assess due to pain;Unable to fully assess due to immobilization Lower Extremity Assessment Lower Extremity Assessment: Overall WFL for tasks assessed Cervical / Trunk Assessment Cervical / Trunk Assessment: Normal     Mobility Bed Mobility Bed Mobility: Supine to Sit;Sit to Supine Supine to Sit: 4: Min guard;HOB flat Sit to Supine: 4: Min guard;HOB flat Details for Bed Mobility Assistance: Daughter is quick to assist pt when not needed. Transfers Transfers: Sit to Stand;Stand to Sit Sit to Stand: 4: Min guard;From bed;From chair/3-in-1 Stand to Sit: 4: Min guard;To bed;To chair/3-in-1     Exercise     Balance Balance Balance Assessed: Yes Static Standing Balance Static Standing - Balance Support: No upper extremity supported Static Standing - Level of Assistance: 5: Stand by assistance Static Standing - Comment/# of Minutes: 3   End of Session OT - End of Session Activity Tolerance: Patient limited by pain;Patient limited by fatigue Patient left: in bed;with call bell/phone within reach;with family/visitor present Nurse Communication: Mobility status  GO     Malka So 04/16/2013, 10:00 AM [phone]

## 2013-04-17 MED ORDER — HYDROCODONE-ACETAMINOPHEN 5-325 MG PO TABS: 1.0000 | ORAL_TABLET | ORAL | Status: DC | PRN

## 2013-04-17 NOTE — Discharge Summary (Signed)
Physician Discharge Summary   Patient ID: Valerie Kelley MRN: [id number] DOB/AGE: 24-Nov-1933 79 y.o.  Admit date: 04/15/2013 Discharge date: 04/17/2013  Admission Diagnoses:  Left shoulder rotator cuff insufficiency  Discharge Diagnoses:  Same   Surgeries: Procedure(s): REVERSE SHOULDER ARTHROPLASTY LEFT on 04/15/2013   Consultants: PT/OT  Discharged Condition: Stable  Hospital Course: Valerie Kelley is an 79 y.o. female who was admitted 04/15/2013 with a chief complaint of No chief complaint on file. , and found to have a diagnosis of <principal problem not specified>.  They were brought to the operating room on 04/15/2013 and underwent the above named procedures.    The patient had an uncomplicated hospital course and was stable for discharge.  Recent vital signs:  Filed Vitals:   04/17/13 0600  BP: 110/53  Pulse: 82  Temp: 98.8 F (37.1 C)  Resp: 18    Recent laboratory studies:  Results for orders placed during the hospital encounter of 04/15/13  HEMOGLOBIN AND HEMATOCRIT, BLOOD      Result Value Range   Hemoglobin 10.3 (*) 12.0 - 15.0 g/dL   HCT 29.5 (*) 36.0 - [id number] %  BASIC METABOLIC PANEL      Result Value Range   Sodium 136  135 - 145 mEq/L   Potassium 4.1  3.5 - 5.1 mEq/L   Chloride 106  96 - 112 mEq/L   CO2 24  19 - 32 mEq/L   Glucose, Bld 95  70 - 99 mg/dL   BUN 17  6 - 23 mg/dL   Creatinine, Ser 0.93  0.50 - 1.10 mg/dL   Calcium 8.2 (*) 8.4 - 10.5 mg/dL   GFR calc non Af Amer 58 (*) >90 mL/min   GFR calc Af Amer 67 (*) >90 mL/min    Discharge Medications:     Medication List         acetaminophen 325 MG tablet  Commonly known as:  TYLENOL  Take 325 mg by mouth every evening. For pain     albuterol 108 (90 BASE) MCG/ACT inhaler  Commonly known as:  PROVENTIL HFA;VENTOLIN HFA  Inhale 2 puffs into the lungs every 6 (six) hours as needed for wheezing. For wheezing     CALCIUM + D PO  Take 1 tablet by mouth 2 (two) times daily.     cholecalciferol 1000 UNITS tablet  Commonly known as:  VITAMIN D  Take 1,000 Units by mouth daily.     estradiol 1 MG tablet  Commonly known as:  ESTRACE  Take 1 mg by mouth daily.     EYE VITAMINS PO  Take 1 tablet by mouth daily. Vision Support Eye Vitamin     fish oil-omega-3 fatty acids 1000 MG capsule  Take 2 g by mouth daily.     flintstones complete 60 MG chewable tablet  Chew 1 tablet by mouth daily.     HYDROcodone-acetaminophen 5-325 MG per tablet  Commonly known as:  NORCO/VICODIN  Take 1-2 tablets by mouth every 4 (four) hours as needed.     Lecithin Gran  Take 15 mLs by mouth daily.     loratadine 10 MG tablet  Commonly known as:  CLARITIN  Take 10 mg by mouth daily.     OVER THE COUNTER MEDICATION  - Take 0.5 tablets by mouth daily as needed. For pain  - Medication:Curamin     OVER THE COUNTER MEDICATION  Take 1 tablet by mouth daily. Medication:Thytrophin     polyethylene glycol packet  Commonly known as:  MIRALAX / GLYCOLAX  Take 51 g by mouth daily. Takes 3 capfuls daily     progesterone 100 MG capsule  Commonly known as:  PROMETRIUM  Take 100 mg by mouth daily.     ranitidine 150 MG tablet  Commonly known as:  ZANTAC  Take 150 mg by mouth 2 (two) times daily.     SYSTANE OP  Place 1 drop into both eyes daily as needed. For dry/irritated eyes     tiotropium 18 MCG inhalation capsule  Commonly known as:  SPIRIVA  Place 18 mcg into inhaler and inhale daily.     traZODone 50 MG tablet  Commonly known as:  DESYREL  Take 50 mg by mouth at bedtime.     vitamin E 400 UNIT capsule  Take 800 Units by mouth daily.        Diagnostic Studies: Dg Chest 2 View  04/09/2013   *RADIOLOGY REPORT*  Clinical Data:  Preoperative evaluation for shoulder replacement, shortness of breath, emphysema  CHEST - 2 VIEW  Comparison: None  Findings: Normal heart size, mediastinal contours, and pulmonary vascularity. Emphysematous changes with atelectasis and  question infiltrate in right middle lobe. Remaining lungs clear. No pleural effusion or pneumothorax. Questionable nodular density versus summation artifact or less likely a small bone island in the upper left chest superimposed with the anterior left second and posterior left fifth ribs.  IMPRESSION: COPD changes with atelectasis and question infiltrate right middle lobe. Potential nodular density in left upper lobe; follow-up radiographs to ensure resolution or CT imaging recommended to exclude pulmonary nodule.   Original Report Authenticated By: Lavonia Dana, M.D.   Dg Shoulder Left  04/15/2013   *RADIOLOGY REPORT*  Clinical Data: Status post left shoulder replacement.  LEFT SHOULDER - 2+ VIEW  Comparison: None.  Findings: The patient has a new reverse left shoulder arthroplasty. The device appears located and no fracture is identified.  Gas in the soft tissues from surgery noted.  IMPRESSION: Left shoulder replacement without evidence of complication.   Original Report Authenticated By: Orlean Patten, M.D.    Disposition: Final discharge disposition not confirmed      Discharge Orders   Future Orders Complete By Expires     Call MD / Call 911  As directed     Comments:      If you experience chest pain or shortness of breath, CALL 911 and be transported to the hospital emergency room.  If you develope a fever above 101 F, pus (white drainage) or increased drainage or redness at the wound, or calf pain, call your surgeon's office.    Constipation Prevention  As directed     Comments:      Drink plenty of fluids.  Prune juice may be helpful.  You may use a stool softener, such as Colace (over the counter) 100 mg twice a day.  Use MiraLax (over the counter) for constipation as needed.    Diet - low sodium heart healthy  As directed     Increase activity slowly as tolerated  As directed        Follow-up Information   Follow up with NORRIS,STEVEN R, MD. Schedule an appointment as soon as  possible for a visit in 2 weeks. [phone])    Contact information:   [address] [place] [address] [id number]        Signed: Ventura Bruns 04/17/2013, 10:48 AM

## 2013-04-17 NOTE — Progress Notes (Signed)
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  Pt very resistant to performing activities on her own, or participating in ADLs.  Dtr dressed pt fully.  Pt required min a for toilet transfer and family will assist her at home.  She and dtr were instructed in all exercises - pt requires max encouragement to perform.  Dtr instructed how to assist and encourage pt, and reinforced need to perform exercises, and that she needs to push herself a bit - she continuously states "I will do more tomorrow".   Written info re: all of the above provided to pt and dtr.    OT comments    Follow Up Recommendations  Supervision/Assistance - 24 hour;No OT follow up    Barriers to Discharge       Equipment Recommendations  None recommended by OT    Recommendations for Other Services    Frequency Min 3X/week   Progress towards OT Goals Progress towards OT goals: Progressing toward goals  Plan Discharge plan remains appropriate    Precautions / Restrictions Precautions Precautions: Fall;Shoulder Type of Shoulder Precautions: ADL, sling ed, pendulums, AROM elbow to hand, AAROM L shoulder per Dr. Veverly Fells protocol. Shoulder Interventions: Shoulder sling/immobilizer;At all times;Off for dressing/bathing/exercises Precaution Booklet Issued: Yes (comment)   Pertinent Vitals/Pain     ADL  Toilet Transfer: Minimal assistance Toilet Transfer Method: Stand pivot Toilet Transfer Equipment: Comfort height toilet Toileting - Clothing Manipulation and Hygiene: Minimal assistance Where Assessed - Toileting Clothing Manipulation and Hygiene: Standing Transfers/Ambulation Related to ADLs: min a - pt mildly unsteady.  Dtr assistingq ADL  Comments: Pt and dtr instructed in safety with all BADLs.  Dtr assisted with all dressing activities - attempted to encourage pt to do so, but she insisted dtr do it for her.  She states "I will do more tomorrow"    OT Diagnosis:    OT Problem List:   OT Treatment Interventions:     OT Goals(current goals can now be found in the care plan section) Acute Rehab OT Goals Patient Stated Goal: Home with daughter. OT Goal Formulation: With patient Time For Goal Achievement: 04/23/13 Potential to Achieve Goals: Good ADL Goals Pt Will Perform Upper Body Bathing: with modified independence;sitting Pt Will Perform Lower Body Bathing: with modified independence;sit to/from stand Pt Will Perform Upper Body Dressing: with modified independence;sitting Pt Will Perform Lower Body Dressing: with modified independence;sit to/from stand Pt Will Transfer to Toilet: Independently;regular height toilet Additional ADL Goal #1: Pt and family will be independent in donning and doffing sling. Additional ADL Goal #2: Pt and family will be independent in positioning L UE in bed and chair.  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    Subjective Data      Prior Functioning       Cognition  Cognition Arousal/Alertness: Awake/alert Behavior During Therapy: Anxious Overall Cognitive Status: Within Functional Limits for tasks assessed    Mobility  Bed Mobility Bed Mobility: Rolling Right;Right Sidelying to Sit;Sit to Supine Rolling Right: 4: Min guard Right Sidelying to Sit: 4: Min assist;HOB flat Sit to Supine: 4: Min guard Details for Bed Mobility Assistance: Pt insisting on  having assist Transfers Transfers: Sit to Stand;Stand to Sit Sit to Stand: 4: Min guard;With upper extremity assist;From bed;From toilet Stand to Sit: 4: Min guard;With upper extremity assist;To bed;To toilet Details for Transfer Assistance: min  guard assist for safety    Exercises  Shoulder Exercises Pendulum Exercise: Left;10 reps;Standing Shoulder Flexion: AAROM;Left;10 reps;Supine (~30*) Shoulder External Rotation: AAROM;Left;10 reps;Supine (to neutral) Elbow Flexion: AROM;10 reps;Standing Elbow Extension: AROM;Right;Standing Wrist Flexion: AROM;Right;10 reps;Seated Wrist Extension: AROM;Right;10 reps;Seated Digit Composite Flexion: AROM;Right;10 reps Composite Extension: AROM;Right;10 reps;Seated Neck Flexion: AROM;10 reps;Supine Neck Extension: AROM;10 reps;Supine Neck Lateral Flexion - Right: AROM;10 reps;Supine Neck Lateral Flexion - Left: AROM;10 reps;Supine Donning/doffing shirt without moving shoulder: Caregiver independent with task Method for sponge bathing under operated UE: Caregiver independent with task Donning/doffing sling/immobilizer: Caregiver independent with task Correct positioning of sling/immobilizer: Caregiver independent with task Pendulum exercises (written home exercise program): Minimal assistance (due to balance) ROM for elbow, wrist and digits of operated UE: Supervision/safety Sling wearing schedule (on at all times/off for ADL's): Independent;Caregiver independent with task Proper positioning of operated UE when showering: Supervision/safety;Caregiver independent with task Positioning of UE while sleeping: Supervision/safety;Caregiver independent with task   Balance     End of Session OT - End of Session Equipment Utilized During Treatment: Other (comment) (sling) Activity Tolerance: Patient limited by pain Patient left: in bed;with call bell/phone within reach;with family/visitor present  [place], [name] 04/17/2013, 11:29 AM

## 2013-04-17 NOTE — Progress Notes (Signed)
   Subjective: 2 Days Post-Op Procedure(s) (LRB): REVERSE SHOULDER ARTHROPLASTY LEFT (Left)  Pt feeling a little better today Mild nausea continues  Patient reports pain as mild.  Objective:   VITALS:   Filed Vitals:   04/17/13 0600  BP: 110/53  Pulse: 82  Temp: 98.8 F (37.1 C)  Resp: 18    Left shoulder incision healing well nv intact distally Mild ecchymosis from paper tape  LABS  Recent Labs  04/16/13 0520  HGB 10.3*  HCT 29.5*     Recent Labs  04/16/13 0520  NA 136  K 4.1  BUN 17  CREATININE 0.93  GLUCOSE 95     Assessment/Plan: 2 Days Post-Op Procedure(s) (LRB): REVERSE SHOULDER ARTHROPLASTY LEFT (Left)  PT/OT D/c home today F/u in 2 weeks   Merla Riches, MPAS, PA-C  04/17/2013, 10:47 AM

## 2014-10-25 DIAGNOSIS — E119 Type 2 diabetes mellitus without complications: Secondary | ICD-10-CM | POA: Diagnosis not present

## 2014-10-25 DIAGNOSIS — H16213 Exposure keratoconjunctivitis, bilateral: Secondary | ICD-10-CM | POA: Diagnosis not present

## 2014-10-25 DIAGNOSIS — H524 Presbyopia: Secondary | ICD-10-CM | POA: Diagnosis not present

## 2014-10-25 DIAGNOSIS — Z961 Presence of intraocular lens: Secondary | ICD-10-CM | POA: Diagnosis not present

## 2014-10-25 DIAGNOSIS — H16223 Keratoconjunctivitis sicca, not specified as Sjogren's, bilateral: Secondary | ICD-10-CM | POA: Diagnosis not present

## 2014-10-25 DIAGNOSIS — H43393 Other vitreous opacities, bilateral: Secondary | ICD-10-CM | POA: Diagnosis not present

## 2014-10-27 DIAGNOSIS — Z471 Aftercare following joint replacement surgery: Secondary | ICD-10-CM | POA: Diagnosis not present

## 2014-10-27 DIAGNOSIS — M79632 Pain in left forearm: Secondary | ICD-10-CM | POA: Diagnosis not present

## 2014-10-27 DIAGNOSIS — Z96612 Presence of left artificial shoulder joint: Secondary | ICD-10-CM | POA: Diagnosis not present

## 2014-12-06 DIAGNOSIS — Z23 Encounter for immunization: Secondary | ICD-10-CM | POA: Diagnosis not present

## 2014-12-06 DIAGNOSIS — S40811A Abrasion of right upper arm, initial encounter: Secondary | ICD-10-CM | POA: Diagnosis not present

## 2014-12-06 DIAGNOSIS — W010XXA Fall on same level from slipping, tripping and stumbling without subsequent striking against object, initial encounter: Secondary | ICD-10-CM | POA: Diagnosis not present

## 2014-12-23 DIAGNOSIS — E559 Vitamin D deficiency, unspecified: Secondary | ICD-10-CM | POA: Diagnosis not present

## 2014-12-23 DIAGNOSIS — S1086XA Insect bite of other specified part of neck, initial encounter: Secondary | ICD-10-CM | POA: Diagnosis not present

## 2014-12-23 DIAGNOSIS — N183 Chronic kidney disease, stage 3 (moderate): Secondary | ICD-10-CM | POA: Diagnosis not present

## 2014-12-23 DIAGNOSIS — I129 Hypertensive chronic kidney disease with stage 1 through stage 4 chronic kidney disease, or unspecified chronic kidney disease: Secondary | ICD-10-CM | POA: Diagnosis not present

## 2014-12-23 DIAGNOSIS — A932 Colorado tick fever: Secondary | ICD-10-CM | POA: Diagnosis not present

## 2015-02-10 DIAGNOSIS — Z1211 Encounter for screening for malignant neoplasm of colon: Secondary | ICD-10-CM | POA: Diagnosis not present

## 2015-02-10 DIAGNOSIS — Z79899 Other long term (current) drug therapy: Secondary | ICD-10-CM | POA: Diagnosis not present

## 2015-02-10 DIAGNOSIS — I129 Hypertensive chronic kidney disease with stage 1 through stage 4 chronic kidney disease, or unspecified chronic kidney disease: Secondary | ICD-10-CM | POA: Diagnosis not present

## 2015-02-10 DIAGNOSIS — E559 Vitamin D deficiency, unspecified: Secondary | ICD-10-CM | POA: Diagnosis not present

## 2015-02-10 DIAGNOSIS — E039 Hypothyroidism, unspecified: Secondary | ICD-10-CM | POA: Diagnosis not present

## 2015-02-10 DIAGNOSIS — B373 Candidiasis of vulva and vagina: Secondary | ICD-10-CM | POA: Diagnosis not present

## 2015-02-10 DIAGNOSIS — Z0000 Encounter for general adult medical examination without abnormal findings: Secondary | ICD-10-CM | POA: Diagnosis not present

## 2015-02-10 DIAGNOSIS — N959 Unspecified menopausal and perimenopausal disorder: Secondary | ICD-10-CM | POA: Diagnosis not present

## 2015-02-10 DIAGNOSIS — E119 Type 2 diabetes mellitus without complications: Secondary | ICD-10-CM | POA: Diagnosis not present

## 2015-02-10 DIAGNOSIS — N183 Chronic kidney disease, stage 3 (moderate): Secondary | ICD-10-CM | POA: Diagnosis not present

## 2015-02-10 DIAGNOSIS — Z1239 Encounter for other screening for malignant neoplasm of breast: Secondary | ICD-10-CM | POA: Diagnosis not present

## 2015-02-10 DIAGNOSIS — E780 Pure hypercholesterolemia: Secondary | ICD-10-CM | POA: Diagnosis not present

## 2015-02-10 DIAGNOSIS — M8580 Other specified disorders of bone density and structure, unspecified site: Secondary | ICD-10-CM | POA: Diagnosis not present

## 2015-02-10 DIAGNOSIS — E46 Unspecified protein-calorie malnutrition: Secondary | ICD-10-CM | POA: Diagnosis not present

## 2015-02-11 DIAGNOSIS — Z1211 Encounter for screening for malignant neoplasm of colon: Secondary | ICD-10-CM | POA: Diagnosis not present

## 2015-02-11 DIAGNOSIS — L661 Lichen planopilaris: Secondary | ICD-10-CM | POA: Diagnosis not present

## 2015-02-21 DIAGNOSIS — R52 Pain, unspecified: Secondary | ICD-10-CM | POA: Diagnosis not present

## 2015-02-21 DIAGNOSIS — J209 Acute bronchitis, unspecified: Secondary | ICD-10-CM | POA: Diagnosis not present

## 2015-02-21 DIAGNOSIS — R509 Fever, unspecified: Secondary | ICD-10-CM | POA: Diagnosis not present

## 2015-02-21 DIAGNOSIS — R05 Cough: Secondary | ICD-10-CM | POA: Diagnosis not present

## 2015-02-22 DIAGNOSIS — R52 Pain, unspecified: Secondary | ICD-10-CM | POA: Diagnosis not present

## 2015-02-22 DIAGNOSIS — R509 Fever, unspecified: Secondary | ICD-10-CM | POA: Diagnosis not present

## 2015-02-22 DIAGNOSIS — R05 Cough: Secondary | ICD-10-CM | POA: Diagnosis not present

## 2015-03-11 DIAGNOSIS — L661 Lichen planopilaris: Secondary | ICD-10-CM | POA: Diagnosis not present

## 2015-03-14 DIAGNOSIS — Z1231 Encounter for screening mammogram for malignant neoplasm of breast: Secondary | ICD-10-CM | POA: Diagnosis not present

## 2015-04-12 DIAGNOSIS — L661 Lichen planopilaris: Secondary | ICD-10-CM | POA: Diagnosis not present

## 2015-04-25 DIAGNOSIS — H16213 Exposure keratoconjunctivitis, bilateral: Secondary | ICD-10-CM | POA: Diagnosis not present

## 2015-04-25 DIAGNOSIS — H16223 Keratoconjunctivitis sicca, not specified as Sjogren's, bilateral: Secondary | ICD-10-CM | POA: Diagnosis not present

## 2015-05-05 DIAGNOSIS — R5383 Other fatigue: Secondary | ICD-10-CM | POA: Diagnosis not present

## 2015-05-05 DIAGNOSIS — R110 Nausea: Secondary | ICD-10-CM | POA: Diagnosis not present

## 2015-05-05 DIAGNOSIS — F328 Other depressive episodes: Secondary | ICD-10-CM | POA: Diagnosis not present

## 2015-05-06 DIAGNOSIS — E039 Hypothyroidism, unspecified: Secondary | ICD-10-CM | POA: Diagnosis not present

## 2015-06-10 DIAGNOSIS — R1011 Right upper quadrant pain: Secondary | ICD-10-CM | POA: Diagnosis not present

## 2015-06-10 DIAGNOSIS — R112 Nausea with vomiting, unspecified: Secondary | ICD-10-CM | POA: Diagnosis not present

## 2015-06-10 DIAGNOSIS — Z7951 Long term (current) use of inhaled steroids: Secondary | ICD-10-CM | POA: Diagnosis not present

## 2015-06-10 DIAGNOSIS — Z79899 Other long term (current) drug therapy: Secondary | ICD-10-CM | POA: Diagnosis not present

## 2015-06-16 DIAGNOSIS — Z23 Encounter for immunization: Secondary | ICD-10-CM | POA: Diagnosis not present

## 2015-06-27 DIAGNOSIS — I129 Hypertensive chronic kidney disease with stage 1 through stage 4 chronic kidney disease, or unspecified chronic kidney disease: Secondary | ICD-10-CM | POA: Diagnosis not present

## 2015-06-27 DIAGNOSIS — N183 Chronic kidney disease, stage 3 (moderate): Secondary | ICD-10-CM | POA: Diagnosis not present

## 2015-06-27 DIAGNOSIS — E559 Vitamin D deficiency, unspecified: Secondary | ICD-10-CM | POA: Diagnosis not present

## 2015-06-27 DIAGNOSIS — E46 Unspecified protein-calorie malnutrition: Secondary | ICD-10-CM | POA: Diagnosis not present

## 2015-06-27 DIAGNOSIS — R232 Flushing: Secondary | ICD-10-CM | POA: Diagnosis not present

## 2015-07-12 DIAGNOSIS — L669 Cicatricial alopecia, unspecified: Secondary | ICD-10-CM | POA: Diagnosis not present

## 2015-07-12 DIAGNOSIS — L661 Lichen planopilaris: Secondary | ICD-10-CM | POA: Diagnosis not present

## 2015-07-13 DIAGNOSIS — J45909 Unspecified asthma, uncomplicated: Secondary | ICD-10-CM | POA: Diagnosis not present

## 2015-07-13 DIAGNOSIS — Z79899 Other long term (current) drug therapy: Secondary | ICD-10-CM | POA: Diagnosis not present

## 2015-07-13 DIAGNOSIS — I129 Hypertensive chronic kidney disease with stage 1 through stage 4 chronic kidney disease, or unspecified chronic kidney disease: Secondary | ICD-10-CM | POA: Diagnosis not present

## 2015-07-13 DIAGNOSIS — N183 Chronic kidney disease, stage 3 (moderate): Secondary | ICD-10-CM | POA: Diagnosis not present

## 2015-07-13 DIAGNOSIS — M2550 Pain in unspecified joint: Secondary | ICD-10-CM | POA: Diagnosis not present

## 2015-07-13 DIAGNOSIS — E7800 Pure hypercholesterolemia, unspecified: Secondary | ICD-10-CM | POA: Diagnosis not present

## 2015-07-18 DIAGNOSIS — K7689 Other specified diseases of liver: Secondary | ICD-10-CM | POA: Diagnosis not present

## 2015-07-18 DIAGNOSIS — R1011 Right upper quadrant pain: Secondary | ICD-10-CM | POA: Diagnosis not present

## 2015-07-18 DIAGNOSIS — R112 Nausea with vomiting, unspecified: Secondary | ICD-10-CM | POA: Diagnosis not present

## 2015-07-20 DIAGNOSIS — G8929 Other chronic pain: Secondary | ICD-10-CM | POA: Diagnosis not present

## 2015-07-20 DIAGNOSIS — M25512 Pain in left shoulder: Secondary | ICD-10-CM | POA: Diagnosis not present

## 2015-08-11 DIAGNOSIS — Z471 Aftercare following joint replacement surgery: Secondary | ICD-10-CM | POA: Diagnosis not present

## 2015-08-11 DIAGNOSIS — Z96612 Presence of left artificial shoulder joint: Secondary | ICD-10-CM | POA: Diagnosis not present

## 2015-08-12 DIAGNOSIS — K219 Gastro-esophageal reflux disease without esophagitis: Secondary | ICD-10-CM | POA: Diagnosis not present

## 2015-08-12 DIAGNOSIS — Z8371 Family history of colonic polyps: Secondary | ICD-10-CM | POA: Diagnosis not present

## 2015-08-12 DIAGNOSIS — R1011 Right upper quadrant pain: Secondary | ICD-10-CM | POA: Diagnosis not present

## 2015-08-12 DIAGNOSIS — K22719 Barrett's esophagus with dysplasia, unspecified: Secondary | ICD-10-CM | POA: Diagnosis not present

## 2015-08-12 DIAGNOSIS — K3189 Other diseases of stomach and duodenum: Secondary | ICD-10-CM | POA: Diagnosis not present

## 2015-08-12 DIAGNOSIS — K921 Melena: Secondary | ICD-10-CM | POA: Diagnosis not present

## 2015-08-17 DIAGNOSIS — N183 Chronic kidney disease, stage 3 (moderate): Secondary | ICD-10-CM | POA: Diagnosis not present

## 2015-08-17 DIAGNOSIS — R232 Flushing: Secondary | ICD-10-CM | POA: Diagnosis not present

## 2015-08-17 DIAGNOSIS — M2550 Pain in unspecified joint: Secondary | ICD-10-CM | POA: Diagnosis not present

## 2015-08-17 DIAGNOSIS — F331 Major depressive disorder, recurrent, moderate: Secondary | ICD-10-CM | POA: Diagnosis not present

## 2015-08-17 DIAGNOSIS — E46 Unspecified protein-calorie malnutrition: Secondary | ICD-10-CM | POA: Diagnosis not present

## 2015-08-17 DIAGNOSIS — R0602 Shortness of breath: Secondary | ICD-10-CM | POA: Diagnosis not present

## 2015-08-17 DIAGNOSIS — Z9119 Patient's noncompliance with other medical treatment and regimen: Secondary | ICD-10-CM | POA: Diagnosis not present

## 2015-08-17 DIAGNOSIS — L658 Other specified nonscarring hair loss: Secondary | ICD-10-CM | POA: Diagnosis not present

## 2015-08-17 DIAGNOSIS — E7800 Pure hypercholesterolemia, unspecified: Secondary | ICD-10-CM | POA: Diagnosis not present

## 2015-08-17 DIAGNOSIS — Z79899 Other long term (current) drug therapy: Secondary | ICD-10-CM | POA: Diagnosis not present

## 2015-08-17 DIAGNOSIS — I129 Hypertensive chronic kidney disease with stage 1 through stage 4 chronic kidney disease, or unspecified chronic kidney disease: Secondary | ICD-10-CM | POA: Diagnosis not present

## 2015-08-17 DIAGNOSIS — E039 Hypothyroidism, unspecified: Secondary | ICD-10-CM | POA: Diagnosis not present

## 2015-08-23 ENCOUNTER — Ambulatory Visit
Admission: RE | Admit: 2015-08-23 | Discharge: 2015-08-23 | Disposition: A | Discharge status: Home / Self Care | Payer: Medicare Other | Source: Ambulatory Visit | Attending: Specialist | Admitting: Specialist

## 2015-08-23 ENCOUNTER — Other Ambulatory Visit: Payer: Self-pay | Admitting: Specialist

## 2015-08-23 DIAGNOSIS — Z9889 Other specified postprocedural states: Secondary | ICD-10-CM

## 2015-08-23 DIAGNOSIS — Z471 Aftercare following joint replacement surgery: Secondary | ICD-10-CM | POA: Diagnosis not present

## 2015-08-23 DIAGNOSIS — Z96612 Presence of left artificial shoulder joint: Secondary | ICD-10-CM | POA: Diagnosis not present

## 2015-08-23 DIAGNOSIS — M25512 Pain in left shoulder: Secondary | ICD-10-CM | POA: Diagnosis not present

## 2015-08-30 DIAGNOSIS — M542 Cervicalgia: Secondary | ICD-10-CM | POA: Diagnosis not present

## 2015-08-30 DIAGNOSIS — R200 Anesthesia of skin: Secondary | ICD-10-CM | POA: Diagnosis not present

## 2015-08-30 DIAGNOSIS — M50322 Other cervical disc degeneration at C5-C6 level: Secondary | ICD-10-CM | POA: Diagnosis not present

## 2015-08-30 DIAGNOSIS — M50122 Cervical disc disorder at C5-C6 level with radiculopathy: Secondary | ICD-10-CM | POA: Diagnosis not present

## 2015-09-01 DIAGNOSIS — M50122 Cervical disc disorder at C5-C6 level with radiculopathy: Secondary | ICD-10-CM | POA: Diagnosis not present

## 2015-09-13 DIAGNOSIS — K2270 Barrett's esophagus without dysplasia: Secondary | ICD-10-CM | POA: Diagnosis not present

## 2015-09-13 DIAGNOSIS — N189 Chronic kidney disease, unspecified: Secondary | ICD-10-CM | POA: Diagnosis not present

## 2015-09-13 DIAGNOSIS — Z91013 Allergy to seafood: Secondary | ICD-10-CM | POA: Diagnosis not present

## 2015-09-13 DIAGNOSIS — Z79899 Other long term (current) drug therapy: Secondary | ICD-10-CM | POA: Diagnosis not present

## 2015-09-13 DIAGNOSIS — Z888 Allergy status to other drugs, medicaments and biological substances status: Secondary | ICD-10-CM | POA: Diagnosis not present

## 2015-09-13 DIAGNOSIS — K921 Melena: Secondary | ICD-10-CM | POA: Diagnosis not present

## 2015-09-13 DIAGNOSIS — K2930 Chronic superficial gastritis without bleeding: Secondary | ICD-10-CM | POA: Diagnosis not present

## 2015-09-13 DIAGNOSIS — Z880 Allergy status to penicillin: Secondary | ICD-10-CM | POA: Diagnosis not present

## 2015-09-13 DIAGNOSIS — Z1211 Encounter for screening for malignant neoplasm of colon: Secondary | ICD-10-CM | POA: Diagnosis not present

## 2015-09-13 DIAGNOSIS — Z8371 Family history of colonic polyps: Secondary | ICD-10-CM | POA: Diagnosis not present

## 2015-09-13 DIAGNOSIS — K319 Disease of stomach and duodenum, unspecified: Secondary | ICD-10-CM | POA: Diagnosis not present

## 2015-09-13 DIAGNOSIS — E079 Disorder of thyroid, unspecified: Secondary | ICD-10-CM | POA: Diagnosis not present

## 2015-09-13 DIAGNOSIS — K219 Gastro-esophageal reflux disease without esophagitis: Secondary | ICD-10-CM | POA: Diagnosis not present

## 2015-09-13 DIAGNOSIS — Z7951 Long term (current) use of inhaled steroids: Secondary | ICD-10-CM | POA: Diagnosis not present

## 2015-09-13 DIAGNOSIS — J449 Chronic obstructive pulmonary disease, unspecified: Secondary | ICD-10-CM | POA: Diagnosis not present

## 2015-09-13 DIAGNOSIS — R109 Unspecified abdominal pain: Secondary | ICD-10-CM | POA: Diagnosis not present

## 2015-09-13 DIAGNOSIS — J45909 Unspecified asthma, uncomplicated: Secondary | ICD-10-CM | POA: Diagnosis not present

## 2015-09-13 DIAGNOSIS — Z885 Allergy status to narcotic agent status: Secondary | ICD-10-CM | POA: Diagnosis not present

## 2015-09-13 DIAGNOSIS — Z96612 Presence of left artificial shoulder joint: Secondary | ICD-10-CM | POA: Diagnosis not present

## 2015-09-20 DIAGNOSIS — M542 Cervicalgia: Secondary | ICD-10-CM | POA: Diagnosis not present

## 2015-09-23 DIAGNOSIS — R202 Paresthesia of skin: Secondary | ICD-10-CM | POA: Diagnosis not present

## 2015-09-23 DIAGNOSIS — R200 Anesthesia of skin: Secondary | ICD-10-CM | POA: Diagnosis not present

## 2015-09-23 DIAGNOSIS — M50122 Cervical disc disorder at C5-C6 level with radiculopathy: Secondary | ICD-10-CM | POA: Diagnosis not present

## 2015-09-29 DIAGNOSIS — Z885 Allergy status to narcotic agent status: Secondary | ICD-10-CM | POA: Diagnosis not present

## 2015-09-29 DIAGNOSIS — R634 Abnormal weight loss: Secondary | ICD-10-CM | POA: Diagnosis not present

## 2015-09-29 DIAGNOSIS — K5909 Other constipation: Secondary | ICD-10-CM | POA: Diagnosis not present

## 2015-09-29 DIAGNOSIS — Z8371 Family history of colonic polyps: Secondary | ICD-10-CM | POA: Diagnosis not present

## 2015-09-29 DIAGNOSIS — Z79899 Other long term (current) drug therapy: Secondary | ICD-10-CM | POA: Diagnosis not present

## 2015-09-29 DIAGNOSIS — K2900 Acute gastritis without bleeding: Secondary | ICD-10-CM | POA: Diagnosis not present

## 2015-09-29 DIAGNOSIS — J45909 Unspecified asthma, uncomplicated: Secondary | ICD-10-CM | POA: Diagnosis not present

## 2015-09-29 DIAGNOSIS — R110 Nausea: Secondary | ICD-10-CM | POA: Diagnosis not present

## 2015-09-29 DIAGNOSIS — K5900 Constipation, unspecified: Secondary | ICD-10-CM | POA: Diagnosis not present

## 2015-09-29 DIAGNOSIS — J449 Chronic obstructive pulmonary disease, unspecified: Secondary | ICD-10-CM | POA: Diagnosis not present

## 2015-09-29 DIAGNOSIS — Z880 Allergy status to penicillin: Secondary | ICD-10-CM | POA: Diagnosis not present

## 2015-09-29 DIAGNOSIS — R140 Abdominal distension (gaseous): Secondary | ICD-10-CM | POA: Diagnosis not present

## 2015-09-29 DIAGNOSIS — N189 Chronic kidney disease, unspecified: Secondary | ICD-10-CM | POA: Diagnosis not present

## 2015-09-29 DIAGNOSIS — K219 Gastro-esophageal reflux disease without esophagitis: Secondary | ICD-10-CM | POA: Diagnosis not present

## 2015-10-12 DIAGNOSIS — K5900 Constipation, unspecified: Secondary | ICD-10-CM | POA: Diagnosis not present

## 2015-10-12 DIAGNOSIS — R634 Abnormal weight loss: Secondary | ICD-10-CM | POA: Diagnosis not present

## 2015-10-12 DIAGNOSIS — L659 Nonscarring hair loss, unspecified: Secondary | ICD-10-CM | POA: Diagnosis not present

## 2015-10-12 DIAGNOSIS — R5383 Other fatigue: Secondary | ICD-10-CM | POA: Diagnosis not present

## 2015-10-12 DIAGNOSIS — R232 Flushing: Secondary | ICD-10-CM | POA: Diagnosis not present

## 2015-10-17 DIAGNOSIS — M50122 Cervical disc disorder at C5-C6 level with radiculopathy: Secondary | ICD-10-CM | POA: Diagnosis not present

## 2015-10-19 DIAGNOSIS — K5900 Constipation, unspecified: Secondary | ICD-10-CM | POA: Diagnosis not present

## 2015-10-19 DIAGNOSIS — K5909 Other constipation: Secondary | ICD-10-CM | POA: Diagnosis not present

## 2015-10-21 DIAGNOSIS — R200 Anesthesia of skin: Secondary | ICD-10-CM | POA: Diagnosis not present

## 2015-10-21 DIAGNOSIS — M542 Cervicalgia: Secondary | ICD-10-CM | POA: Diagnosis not present

## 2015-10-21 DIAGNOSIS — M50322 Other cervical disc degeneration at C5-C6 level: Secondary | ICD-10-CM | POA: Diagnosis not present

## 2015-10-26 DIAGNOSIS — H16223 Keratoconjunctivitis sicca, not specified as Sjogren's, bilateral: Secondary | ICD-10-CM | POA: Diagnosis not present

## 2015-10-26 DIAGNOSIS — E119 Type 2 diabetes mellitus without complications: Secondary | ICD-10-CM | POA: Diagnosis not present

## 2015-10-26 DIAGNOSIS — H16213 Exposure keratoconjunctivitis, bilateral: Secondary | ICD-10-CM | POA: Diagnosis not present

## 2015-10-26 DIAGNOSIS — H43393 Other vitreous opacities, bilateral: Secondary | ICD-10-CM | POA: Diagnosis not present

## 2015-10-26 DIAGNOSIS — H5213 Myopia, bilateral: Secondary | ICD-10-CM | POA: Diagnosis not present

## 2015-10-26 DIAGNOSIS — Z961 Presence of intraocular lens: Secondary | ICD-10-CM | POA: Diagnosis not present

## 2015-10-27 DIAGNOSIS — L661 Lichen planopilaris: Secondary | ICD-10-CM | POA: Diagnosis not present

## 2015-10-27 DIAGNOSIS — L669 Cicatricial alopecia, unspecified: Secondary | ICD-10-CM | POA: Diagnosis not present

## 2015-11-28 DIAGNOSIS — E785 Hyperlipidemia, unspecified: Secondary | ICD-10-CM | POA: Diagnosis not present

## 2015-11-28 DIAGNOSIS — F329 Major depressive disorder, single episode, unspecified: Secondary | ICD-10-CM | POA: Diagnosis not present

## 2015-12-26 DIAGNOSIS — D100 Benign neoplasm of lip: Secondary | ICD-10-CM | POA: Diagnosis not present

## 2015-12-26 DIAGNOSIS — E872 Acidosis: Secondary | ICD-10-CM | POA: Diagnosis not present

## 2015-12-26 DIAGNOSIS — E039 Hypothyroidism, unspecified: Secondary | ICD-10-CM | POA: Diagnosis not present

## 2015-12-26 DIAGNOSIS — I129 Hypertensive chronic kidney disease with stage 1 through stage 4 chronic kidney disease, or unspecified chronic kidney disease: Secondary | ICD-10-CM | POA: Diagnosis not present

## 2015-12-26 DIAGNOSIS — N183 Chronic kidney disease, stage 3 (moderate): Secondary | ICD-10-CM | POA: Diagnosis not present

## 2015-12-26 DIAGNOSIS — R252 Cramp and spasm: Secondary | ICD-10-CM | POA: Diagnosis not present

## 2015-12-26 DIAGNOSIS — D210 Benign neoplasm of connective and other soft tissue of head, face and neck: Secondary | ICD-10-CM | POA: Diagnosis not present

## 2016-01-11 DIAGNOSIS — M2550 Pain in unspecified joint: Secondary | ICD-10-CM | POA: Diagnosis not present

## 2016-01-11 DIAGNOSIS — F331 Major depressive disorder, recurrent, moderate: Secondary | ICD-10-CM | POA: Diagnosis not present

## 2016-01-11 DIAGNOSIS — R5383 Other fatigue: Secondary | ICD-10-CM | POA: Diagnosis not present

## 2016-01-11 DIAGNOSIS — E46 Unspecified protein-calorie malnutrition: Secondary | ICD-10-CM | POA: Diagnosis not present

## 2016-02-14 DIAGNOSIS — L237 Allergic contact dermatitis due to plants, except food: Secondary | ICD-10-CM | POA: Diagnosis not present

## 2016-02-14 DIAGNOSIS — L661 Lichen planopilaris: Secondary | ICD-10-CM | POA: Diagnosis not present

## 2016-02-15 DIAGNOSIS — K5900 Constipation, unspecified: Secondary | ICD-10-CM | POA: Diagnosis not present

## 2016-02-15 DIAGNOSIS — R109 Unspecified abdominal pain: Secondary | ICD-10-CM | POA: Diagnosis not present

## 2016-02-15 DIAGNOSIS — R7989 Other specified abnormal findings of blood chemistry: Secondary | ICD-10-CM | POA: Diagnosis not present

## 2016-02-29 DIAGNOSIS — R109 Unspecified abdominal pain: Secondary | ICD-10-CM | POA: Diagnosis not present

## 2016-02-29 DIAGNOSIS — R509 Fever, unspecified: Secondary | ICD-10-CM | POA: Diagnosis not present

## 2016-02-29 DIAGNOSIS — R05 Cough: Secondary | ICD-10-CM | POA: Diagnosis not present

## 2016-02-29 DIAGNOSIS — R918 Other nonspecific abnormal finding of lung field: Secondary | ICD-10-CM | POA: Diagnosis not present

## 2016-02-29 DIAGNOSIS — J181 Lobar pneumonia, unspecified organism: Secondary | ICD-10-CM | POA: Diagnosis not present

## 2016-03-05 DIAGNOSIS — R509 Fever, unspecified: Secondary | ICD-10-CM | POA: Diagnosis not present

## 2016-04-16 DIAGNOSIS — I129 Hypertensive chronic kidney disease with stage 1 through stage 4 chronic kidney disease, or unspecified chronic kidney disease: Secondary | ICD-10-CM | POA: Diagnosis not present

## 2016-04-16 DIAGNOSIS — Z136 Encounter for screening for cardiovascular disorders: Secondary | ICD-10-CM | POA: Diagnosis not present

## 2016-04-16 DIAGNOSIS — Z79899 Other long term (current) drug therapy: Secondary | ICD-10-CM | POA: Diagnosis not present

## 2016-04-16 DIAGNOSIS — E785 Hyperlipidemia, unspecified: Secondary | ICD-10-CM | POA: Diagnosis not present

## 2016-04-16 DIAGNOSIS — Z681 Body mass index (BMI) 19 or less, adult: Secondary | ICD-10-CM | POA: Diagnosis not present

## 2016-04-16 DIAGNOSIS — N183 Chronic kidney disease, stage 3 (moderate): Secondary | ICD-10-CM | POA: Diagnosis not present

## 2016-04-16 DIAGNOSIS — Z9181 History of falling: Secondary | ICD-10-CM | POA: Diagnosis not present

## 2016-04-16 DIAGNOSIS — Z0000 Encounter for general adult medical examination without abnormal findings: Secondary | ICD-10-CM | POA: Diagnosis not present

## 2016-04-16 DIAGNOSIS — F331 Major depressive disorder, recurrent, moderate: Secondary | ICD-10-CM | POA: Diagnosis not present

## 2016-04-16 DIAGNOSIS — W540XXA Bitten by dog, initial encounter: Secondary | ICD-10-CM | POA: Diagnosis not present

## 2016-04-16 DIAGNOSIS — G4700 Insomnia, unspecified: Secondary | ICD-10-CM | POA: Diagnosis not present

## 2016-04-16 DIAGNOSIS — E039 Hypothyroidism, unspecified: Secondary | ICD-10-CM | POA: Diagnosis not present

## 2016-04-17 DIAGNOSIS — H1033 Unspecified acute conjunctivitis, bilateral: Secondary | ICD-10-CM | POA: Diagnosis not present

## 2016-04-26 DIAGNOSIS — I129 Hypertensive chronic kidney disease with stage 1 through stage 4 chronic kidney disease, or unspecified chronic kidney disease: Secondary | ICD-10-CM | POA: Diagnosis not present

## 2016-04-26 DIAGNOSIS — E872 Acidosis: Secondary | ICD-10-CM | POA: Diagnosis not present

## 2016-04-26 DIAGNOSIS — N183 Chronic kidney disease, stage 3 (moderate): Secondary | ICD-10-CM | POA: Diagnosis not present

## 2016-05-06 DIAGNOSIS — J449 Chronic obstructive pulmonary disease, unspecified: Secondary | ICD-10-CM | POA: Diagnosis not present

## 2016-05-10 DIAGNOSIS — L661 Lichen planopilaris: Secondary | ICD-10-CM | POA: Diagnosis not present

## 2016-05-15 DIAGNOSIS — L648 Other androgenic alopecia: Secondary | ICD-10-CM | POA: Diagnosis not present

## 2016-05-15 DIAGNOSIS — L661 Lichen planopilaris: Secondary | ICD-10-CM | POA: Diagnosis not present

## 2016-05-15 DIAGNOSIS — H16213 Exposure keratoconjunctivitis, bilateral: Secondary | ICD-10-CM | POA: Diagnosis not present

## 2016-05-15 DIAGNOSIS — Z79899 Other long term (current) drug therapy: Secondary | ICD-10-CM | POA: Diagnosis not present

## 2016-05-15 DIAGNOSIS — H5213 Myopia, bilateral: Secondary | ICD-10-CM | POA: Diagnosis not present

## 2016-05-15 DIAGNOSIS — H16223 Keratoconjunctivitis sicca, not specified as Sjogren's, bilateral: Secondary | ICD-10-CM | POA: Diagnosis not present

## 2016-05-24 DIAGNOSIS — M159 Polyosteoarthritis, unspecified: Secondary | ICD-10-CM

## 2016-05-24 DIAGNOSIS — M791 Myalgia: Secondary | ICD-10-CM | POA: Diagnosis not present

## 2016-05-24 DIAGNOSIS — Z79899 Other long term (current) drug therapy: Secondary | ICD-10-CM | POA: Diagnosis not present

## 2016-05-24 DIAGNOSIS — M2550 Pain in unspecified joint: Secondary | ICD-10-CM

## 2016-05-24 HISTORY — DX: Pain in unspecified joint: M25.50

## 2016-05-24 HISTORY — DX: Polyosteoarthritis, unspecified: M15.9

## 2016-06-21 DIAGNOSIS — Z23 Encounter for immunization: Secondary | ICD-10-CM | POA: Diagnosis not present

## 2016-07-31 DIAGNOSIS — M542 Cervicalgia: Secondary | ICD-10-CM | POA: Diagnosis not present

## 2016-07-31 DIAGNOSIS — M50322 Other cervical disc degeneration at C5-C6 level: Secondary | ICD-10-CM | POA: Diagnosis not present

## 2016-08-01 DIAGNOSIS — K589 Irritable bowel syndrome without diarrhea: Secondary | ICD-10-CM | POA: Diagnosis not present

## 2016-08-01 DIAGNOSIS — D1803 Hemangioma of intra-abdominal structures: Secondary | ICD-10-CM | POA: Diagnosis not present

## 2016-08-01 DIAGNOSIS — R7989 Other specified abnormal findings of blood chemistry: Secondary | ICD-10-CM | POA: Diagnosis not present

## 2016-08-01 DIAGNOSIS — N281 Cyst of kidney, acquired: Secondary | ICD-10-CM | POA: Diagnosis not present

## 2016-08-01 DIAGNOSIS — K769 Liver disease, unspecified: Secondary | ICD-10-CM | POA: Diagnosis not present

## 2016-08-01 DIAGNOSIS — K22719 Barrett's esophagus with dysplasia, unspecified: Secondary | ICD-10-CM | POA: Diagnosis not present

## 2016-08-01 DIAGNOSIS — Z79899 Other long term (current) drug therapy: Secondary | ICD-10-CM | POA: Diagnosis not present

## 2016-08-14 DIAGNOSIS — L661 Lichen planopilaris: Secondary | ICD-10-CM | POA: Diagnosis not present

## 2016-09-03 DIAGNOSIS — N183 Chronic kidney disease, stage 3 (moderate): Secondary | ICD-10-CM | POA: Diagnosis not present

## 2016-10-04 DIAGNOSIS — M542 Cervicalgia: Secondary | ICD-10-CM | POA: Diagnosis not present

## 2016-10-04 DIAGNOSIS — M791 Myalgia: Secondary | ICD-10-CM | POA: Diagnosis not present

## 2016-10-04 DIAGNOSIS — M50322 Other cervical disc degeneration at C5-C6 level: Secondary | ICD-10-CM | POA: Diagnosis not present

## 2016-11-08 DIAGNOSIS — M50322 Other cervical disc degeneration at C5-C6 level: Secondary | ICD-10-CM | POA: Diagnosis not present

## 2016-11-08 DIAGNOSIS — M542 Cervicalgia: Secondary | ICD-10-CM | POA: Diagnosis not present

## 2016-11-08 DIAGNOSIS — M50122 Cervical disc disorder at C5-C6 level with radiculopathy: Secondary | ICD-10-CM | POA: Diagnosis not present

## 2016-11-26 DIAGNOSIS — H524 Presbyopia: Secondary | ICD-10-CM | POA: Diagnosis not present

## 2016-11-26 DIAGNOSIS — H52203 Unspecified astigmatism, bilateral: Secondary | ICD-10-CM | POA: Diagnosis not present

## 2016-11-26 DIAGNOSIS — H43393 Other vitreous opacities, bilateral: Secondary | ICD-10-CM | POA: Diagnosis not present

## 2016-11-26 DIAGNOSIS — H16213 Exposure keratoconjunctivitis, bilateral: Secondary | ICD-10-CM | POA: Diagnosis not present

## 2016-11-26 DIAGNOSIS — H16223 Keratoconjunctivitis sicca, not specified as Sjogren's, bilateral: Secondary | ICD-10-CM | POA: Diagnosis not present

## 2016-11-27 DIAGNOSIS — M2550 Pain in unspecified joint: Secondary | ICD-10-CM | POA: Diagnosis not present

## 2016-11-27 DIAGNOSIS — E039 Hypothyroidism, unspecified: Secondary | ICD-10-CM | POA: Diagnosis not present

## 2016-11-27 DIAGNOSIS — F341 Dysthymic disorder: Secondary | ICD-10-CM | POA: Diagnosis not present

## 2016-11-27 DIAGNOSIS — E785 Hyperlipidemia, unspecified: Secondary | ICD-10-CM | POA: Diagnosis not present

## 2016-11-27 DIAGNOSIS — I129 Hypertensive chronic kidney disease with stage 1 through stage 4 chronic kidney disease, or unspecified chronic kidney disease: Secondary | ICD-10-CM | POA: Diagnosis not present

## 2016-11-27 DIAGNOSIS — N183 Chronic kidney disease, stage 3 (moderate): Secondary | ICD-10-CM | POA: Diagnosis not present

## 2016-11-27 DIAGNOSIS — Z79899 Other long term (current) drug therapy: Secondary | ICD-10-CM | POA: Diagnosis not present

## 2017-01-30 DIAGNOSIS — R194 Change in bowel habit: Secondary | ICD-10-CM | POA: Diagnosis not present

## 2017-01-30 DIAGNOSIS — R140 Abdominal distension (gaseous): Secondary | ICD-10-CM | POA: Diagnosis not present

## 2017-01-30 DIAGNOSIS — R110 Nausea: Secondary | ICD-10-CM | POA: Diagnosis not present

## 2017-01-30 DIAGNOSIS — N189 Chronic kidney disease, unspecified: Secondary | ICD-10-CM | POA: Diagnosis not present

## 2017-01-30 DIAGNOSIS — K219 Gastro-esophageal reflux disease without esophagitis: Secondary | ICD-10-CM | POA: Diagnosis not present

## 2017-02-19 DIAGNOSIS — R140 Abdominal distension (gaseous): Secondary | ICD-10-CM | POA: Diagnosis not present

## 2017-02-19 DIAGNOSIS — R634 Abnormal weight loss: Secondary | ICD-10-CM | POA: Diagnosis not present

## 2017-02-19 DIAGNOSIS — R110 Nausea: Secondary | ICD-10-CM | POA: Diagnosis not present

## 2017-02-19 DIAGNOSIS — K5900 Constipation, unspecified: Secondary | ICD-10-CM | POA: Diagnosis not present

## 2017-02-19 DIAGNOSIS — R143 Flatulence: Secondary | ICD-10-CM | POA: Diagnosis not present

## 2017-02-19 IMAGING — CT CT SHOULDER*L* W/O CM
2 of 4 series · 4 of 14 positions shown, 5 images · non-contrast
Comparison: Radiograph 04/15/2013

CLINICAL DATA: Left shoulder pain and decreased range of motion.
History of reverse shoulder arthroplasty 3 years ago.

EXAM:
CT OF THE LEFT SHOULDER WITHOUT CONTRAST
TECHNIQUE: Multidetector CT imaging was performed according to the standard
protocol. Multiplanar CT image reconstructions were also generated.

[Series 200: sag bone · oblique · 0.45mm/px · 2 of 90 slices shown, 3 images]
[im 30/90  soft-tissue]
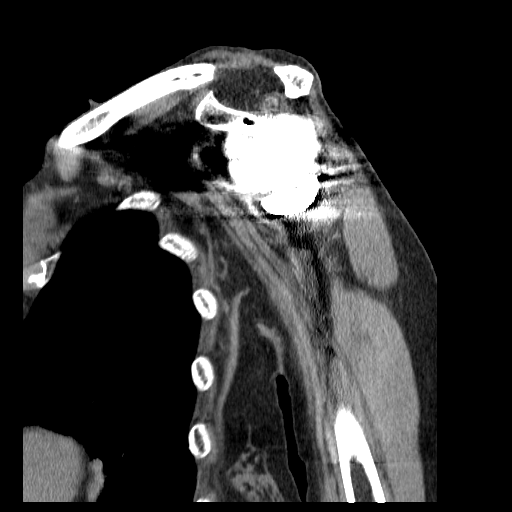
[im 30/90  bone]
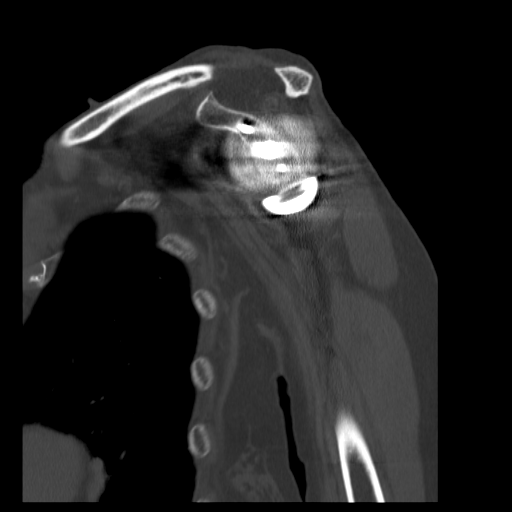
[im 60/90  bone]
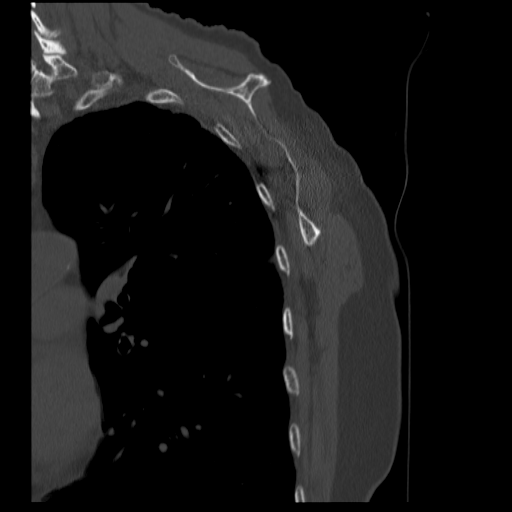

[Series 301: sag soft · oblique · 0.45mm/px · 2 of 90 slices shown]
[im 30/90  soft-tissue]
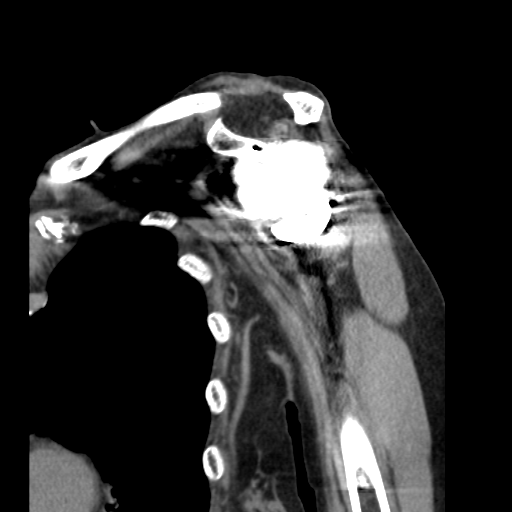
[im 60/90  soft-tissue]
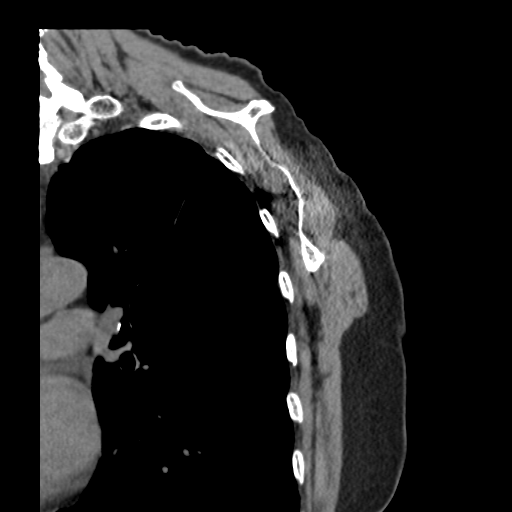

[4 of 14 positions shown; findings below may reference images not displayed]

FINDINGS: The glenoid and humeral components are well seated without
complicating features. The scapular screws are in good position
without complicating features. There is moderate cortical thinning
involving the humerus but this is likely related to age and
osteoporosis. I do not see any obvious CT findings for loosening or
particle disease. No periprosthetic fractures identified.

Small calcifications are noted in the region of the axillary recess.
These could also be synovial or capsular calcifications or remote
small fracture fragments.

Moderate subacromial spurring changes are noted. The AC joint is
intact. No rib abnormalities. The visualized left lung is clear.
IMPRESSION: Well seated appearing components of the reverse shoulder
arthroplasty without complicating features.

Moderate subacromial spurring changes.

## 2017-03-26 DIAGNOSIS — R0782 Intercostal pain: Secondary | ICD-10-CM | POA: Diagnosis not present

## 2017-03-26 DIAGNOSIS — J0100 Acute maxillary sinusitis, unspecified: Secondary | ICD-10-CM | POA: Diagnosis not present

## 2017-03-26 DIAGNOSIS — J449 Chronic obstructive pulmonary disease, unspecified: Secondary | ICD-10-CM | POA: Diagnosis not present

## 2017-03-28 DIAGNOSIS — J438 Other emphysema: Secondary | ICD-10-CM | POA: Diagnosis not present

## 2017-03-28 DIAGNOSIS — J449 Chronic obstructive pulmonary disease, unspecified: Secondary | ICD-10-CM | POA: Diagnosis not present

## 2017-03-28 DIAGNOSIS — J22 Unspecified acute lower respiratory infection: Secondary | ICD-10-CM | POA: Diagnosis not present

## 2017-04-08 DIAGNOSIS — R0602 Shortness of breath: Secondary | ICD-10-CM | POA: Diagnosis not present

## 2017-04-08 DIAGNOSIS — R079 Chest pain, unspecified: Secondary | ICD-10-CM | POA: Diagnosis not present

## 2017-04-08 DIAGNOSIS — J438 Other emphysema: Secondary | ICD-10-CM | POA: Diagnosis not present

## 2017-04-08 DIAGNOSIS — J449 Chronic obstructive pulmonary disease, unspecified: Secondary | ICD-10-CM | POA: Diagnosis not present

## 2017-04-22 DIAGNOSIS — I2699 Other pulmonary embolism without acute cor pulmonale: Secondary | ICD-10-CM | POA: Diagnosis not present

## 2017-04-22 DIAGNOSIS — J449 Chronic obstructive pulmonary disease, unspecified: Secondary | ICD-10-CM | POA: Diagnosis not present

## 2017-04-23 DIAGNOSIS — R079 Chest pain, unspecified: Secondary | ICD-10-CM | POA: Diagnosis not present

## 2017-04-23 DIAGNOSIS — I700 Atherosclerosis of aorta: Secondary | ICD-10-CM | POA: Diagnosis not present

## 2017-04-23 DIAGNOSIS — R0602 Shortness of breath: Secondary | ICD-10-CM | POA: Diagnosis not present

## 2017-04-23 DIAGNOSIS — I2699 Other pulmonary embolism without acute cor pulmonale: Secondary | ICD-10-CM | POA: Diagnosis not present

## 2017-04-30 DIAGNOSIS — J479 Bronchiectasis, uncomplicated: Secondary | ICD-10-CM | POA: Diagnosis not present

## 2017-04-30 DIAGNOSIS — R918 Other nonspecific abnormal finding of lung field: Secondary | ICD-10-CM | POA: Diagnosis not present

## 2017-04-30 DIAGNOSIS — J4520 Mild intermittent asthma, uncomplicated: Secondary | ICD-10-CM | POA: Diagnosis not present

## 2017-04-30 DIAGNOSIS — J301 Allergic rhinitis due to pollen: Secondary | ICD-10-CM | POA: Diagnosis not present

## 2017-05-01 DIAGNOSIS — Z136 Encounter for screening for cardiovascular disorders: Secondary | ICD-10-CM | POA: Diagnosis not present

## 2017-05-01 DIAGNOSIS — E785 Hyperlipidemia, unspecified: Secondary | ICD-10-CM | POA: Diagnosis not present

## 2017-05-01 DIAGNOSIS — Z1389 Encounter for screening for other disorder: Secondary | ICD-10-CM | POA: Diagnosis not present

## 2017-05-01 DIAGNOSIS — I129 Hypertensive chronic kidney disease with stage 1 through stage 4 chronic kidney disease, or unspecified chronic kidney disease: Secondary | ICD-10-CM | POA: Diagnosis not present

## 2017-05-01 DIAGNOSIS — E039 Hypothyroidism, unspecified: Secondary | ICD-10-CM | POA: Diagnosis not present

## 2017-05-01 DIAGNOSIS — Z0000 Encounter for general adult medical examination without abnormal findings: Secondary | ICD-10-CM | POA: Diagnosis not present

## 2017-05-29 DIAGNOSIS — H524 Presbyopia: Secondary | ICD-10-CM | POA: Diagnosis not present

## 2017-05-29 DIAGNOSIS — H16223 Keratoconjunctivitis sicca, not specified as Sjogren's, bilateral: Secondary | ICD-10-CM | POA: Diagnosis not present

## 2017-05-29 DIAGNOSIS — H16213 Exposure keratoconjunctivitis, bilateral: Secondary | ICD-10-CM | POA: Diagnosis not present

## 2017-05-29 DIAGNOSIS — H52203 Unspecified astigmatism, bilateral: Secondary | ICD-10-CM | POA: Diagnosis not present

## 2017-05-31 DIAGNOSIS — J4520 Mild intermittent asthma, uncomplicated: Secondary | ICD-10-CM | POA: Diagnosis not present

## 2017-05-31 DIAGNOSIS — J479 Bronchiectasis, uncomplicated: Secondary | ICD-10-CM | POA: Diagnosis not present

## 2017-05-31 DIAGNOSIS — J301 Allergic rhinitis due to pollen: Secondary | ICD-10-CM | POA: Diagnosis not present

## 2017-05-31 DIAGNOSIS — R918 Other nonspecific abnormal finding of lung field: Secondary | ICD-10-CM | POA: Diagnosis not present

## 2017-06-05 DIAGNOSIS — Z23 Encounter for immunization: Secondary | ICD-10-CM | POA: Diagnosis not present

## 2017-06-05 DIAGNOSIS — R197 Diarrhea, unspecified: Secondary | ICD-10-CM | POA: Diagnosis not present

## 2017-06-10 DIAGNOSIS — J301 Allergic rhinitis due to pollen: Secondary | ICD-10-CM | POA: Diagnosis not present

## 2017-08-12 DIAGNOSIS — N183 Chronic kidney disease, stage 3 (moderate): Secondary | ICD-10-CM | POA: Diagnosis not present

## 2017-08-12 DIAGNOSIS — R634 Abnormal weight loss: Secondary | ICD-10-CM | POA: Diagnosis not present

## 2017-08-12 DIAGNOSIS — I129 Hypertensive chronic kidney disease with stage 1 through stage 4 chronic kidney disease, or unspecified chronic kidney disease: Secondary | ICD-10-CM | POA: Diagnosis not present

## 2017-08-12 DIAGNOSIS — R636 Underweight: Secondary | ICD-10-CM | POA: Diagnosis not present

## 2017-08-12 DIAGNOSIS — F341 Dysthymic disorder: Secondary | ICD-10-CM | POA: Diagnosis not present

## 2017-08-12 DIAGNOSIS — M2550 Pain in unspecified joint: Secondary | ICD-10-CM | POA: Diagnosis not present

## 2017-08-12 DIAGNOSIS — E785 Hyperlipidemia, unspecified: Secondary | ICD-10-CM | POA: Diagnosis not present

## 2017-08-12 DIAGNOSIS — R32 Unspecified urinary incontinence: Secondary | ICD-10-CM | POA: Diagnosis not present

## 2017-08-12 DIAGNOSIS — E039 Hypothyroidism, unspecified: Secondary | ICD-10-CM | POA: Diagnosis not present

## 2017-08-19 DIAGNOSIS — N3941 Urge incontinence: Secondary | ICD-10-CM | POA: Diagnosis not present

## 2017-08-19 DIAGNOSIS — N183 Chronic kidney disease, stage 3 (moderate): Secondary | ICD-10-CM | POA: Diagnosis not present

## 2017-08-19 DIAGNOSIS — I129 Hypertensive chronic kidney disease with stage 1 through stage 4 chronic kidney disease, or unspecified chronic kidney disease: Secondary | ICD-10-CM | POA: Diagnosis not present

## 2017-09-10 DIAGNOSIS — J479 Bronchiectasis, uncomplicated: Secondary | ICD-10-CM | POA: Diagnosis not present

## 2017-09-10 DIAGNOSIS — J4520 Mild intermittent asthma, uncomplicated: Secondary | ICD-10-CM | POA: Diagnosis not present

## 2017-09-10 DIAGNOSIS — J301 Allergic rhinitis due to pollen: Secondary | ICD-10-CM | POA: Diagnosis not present

## 2017-10-24 DIAGNOSIS — J301 Allergic rhinitis due to pollen: Secondary | ICD-10-CM | POA: Diagnosis not present

## 2017-10-24 DIAGNOSIS — J471 Bronchiectasis with (acute) exacerbation: Secondary | ICD-10-CM | POA: Diagnosis not present

## 2017-10-24 DIAGNOSIS — J4520 Mild intermittent asthma, uncomplicated: Secondary | ICD-10-CM | POA: Diagnosis not present

## 2017-11-30 DIAGNOSIS — B029 Zoster without complications: Secondary | ICD-10-CM | POA: Diagnosis not present

## 2017-11-30 DIAGNOSIS — R21 Rash and other nonspecific skin eruption: Secondary | ICD-10-CM | POA: Diagnosis not present

## 2018-01-22 DIAGNOSIS — S0501XA Injury of conjunctiva and corneal abrasion without foreign body, right eye, initial encounter: Secondary | ICD-10-CM | POA: Diagnosis not present

## 2018-01-22 DIAGNOSIS — T1501XA Foreign body in cornea, right eye, initial encounter: Secondary | ICD-10-CM | POA: Diagnosis not present

## 2018-02-10 DIAGNOSIS — E039 Hypothyroidism, unspecified: Secondary | ICD-10-CM | POA: Diagnosis not present

## 2018-02-10 DIAGNOSIS — R5382 Chronic fatigue, unspecified: Secondary | ICD-10-CM | POA: Diagnosis not present

## 2018-02-10 DIAGNOSIS — J449 Chronic obstructive pulmonary disease, unspecified: Secondary | ICD-10-CM | POA: Diagnosis not present

## 2018-02-10 DIAGNOSIS — I129 Hypertensive chronic kidney disease with stage 1 through stage 4 chronic kidney disease, or unspecified chronic kidney disease: Secondary | ICD-10-CM | POA: Diagnosis not present

## 2018-02-10 DIAGNOSIS — M159 Polyosteoarthritis, unspecified: Secondary | ICD-10-CM | POA: Diagnosis not present

## 2018-02-10 DIAGNOSIS — Z9181 History of falling: Secondary | ICD-10-CM | POA: Diagnosis not present

## 2018-02-19 DIAGNOSIS — R5383 Other fatigue: Secondary | ICD-10-CM | POA: Diagnosis not present

## 2018-02-19 DIAGNOSIS — R5381 Other malaise: Secondary | ICD-10-CM | POA: Diagnosis not present

## 2018-02-19 DIAGNOSIS — R110 Nausea: Secondary | ICD-10-CM | POA: Diagnosis not present

## 2018-02-19 DIAGNOSIS — Z8719 Personal history of other diseases of the digestive system: Secondary | ICD-10-CM | POA: Diagnosis not present

## 2018-02-19 DIAGNOSIS — K911 Postgastric surgery syndromes: Secondary | ICD-10-CM | POA: Diagnosis not present

## 2018-02-26 DIAGNOSIS — E785 Hyperlipidemia, unspecified: Secondary | ICD-10-CM | POA: Diagnosis not present

## 2018-02-26 DIAGNOSIS — J449 Chronic obstructive pulmonary disease, unspecified: Secondary | ICD-10-CM | POA: Diagnosis not present

## 2018-02-26 DIAGNOSIS — F329 Major depressive disorder, single episode, unspecified: Secondary | ICD-10-CM | POA: Diagnosis not present

## 2018-02-26 DIAGNOSIS — N183 Chronic kidney disease, stage 3 (moderate): Secondary | ICD-10-CM | POA: Diagnosis not present

## 2018-02-26 DIAGNOSIS — M159 Polyosteoarthritis, unspecified: Secondary | ICD-10-CM | POA: Diagnosis not present

## 2018-03-10 DIAGNOSIS — F329 Major depressive disorder, single episode, unspecified: Secondary | ICD-10-CM | POA: Diagnosis not present

## 2018-03-10 DIAGNOSIS — M159 Polyosteoarthritis, unspecified: Secondary | ICD-10-CM | POA: Diagnosis not present

## 2018-03-10 DIAGNOSIS — E785 Hyperlipidemia, unspecified: Secondary | ICD-10-CM | POA: Diagnosis not present

## 2018-03-10 DIAGNOSIS — J449 Chronic obstructive pulmonary disease, unspecified: Secondary | ICD-10-CM | POA: Diagnosis not present

## 2018-04-21 DIAGNOSIS — M5030 Other cervical disc degeneration, unspecified cervical region: Secondary | ICD-10-CM

## 2018-04-21 HISTORY — DX: Other cervical disc degeneration, unspecified cervical region: M50.30

## 2018-04-24 DIAGNOSIS — H43813 Vitreous degeneration, bilateral: Secondary | ICD-10-CM | POA: Diagnosis not present

## 2018-04-24 DIAGNOSIS — Z961 Presence of intraocular lens: Secondary | ICD-10-CM | POA: Diagnosis not present

## 2018-05-22 DIAGNOSIS — M5030 Other cervical disc degeneration, unspecified cervical region: Secondary | ICD-10-CM | POA: Diagnosis not present

## 2018-06-06 DIAGNOSIS — M5412 Radiculopathy, cervical region: Secondary | ICD-10-CM | POA: Diagnosis not present

## 2018-06-12 DIAGNOSIS — F329 Major depressive disorder, single episode, unspecified: Secondary | ICD-10-CM | POA: Diagnosis not present

## 2018-06-12 DIAGNOSIS — E785 Hyperlipidemia, unspecified: Secondary | ICD-10-CM | POA: Diagnosis not present

## 2018-06-12 DIAGNOSIS — Z1339 Encounter for screening examination for other mental health and behavioral disorders: Secondary | ICD-10-CM | POA: Diagnosis not present

## 2018-06-12 DIAGNOSIS — M159 Polyosteoarthritis, unspecified: Secondary | ICD-10-CM | POA: Diagnosis not present

## 2018-06-12 DIAGNOSIS — I129 Hypertensive chronic kidney disease with stage 1 through stage 4 chronic kidney disease, or unspecified chronic kidney disease: Secondary | ICD-10-CM | POA: Diagnosis not present

## 2018-06-12 DIAGNOSIS — J449 Chronic obstructive pulmonary disease, unspecified: Secondary | ICD-10-CM | POA: Diagnosis not present

## 2018-06-12 DIAGNOSIS — Z23 Encounter for immunization: Secondary | ICD-10-CM | POA: Diagnosis not present

## 2018-06-14 DIAGNOSIS — M5030 Other cervical disc degeneration, unspecified cervical region: Secondary | ICD-10-CM | POA: Diagnosis not present

## 2018-06-23 DIAGNOSIS — M4802 Spinal stenosis, cervical region: Secondary | ICD-10-CM

## 2018-06-23 DIAGNOSIS — M5030 Other cervical disc degeneration, unspecified cervical region: Secondary | ICD-10-CM | POA: Diagnosis not present

## 2018-06-23 HISTORY — DX: Spinal stenosis, cervical region: M48.02

## 2018-08-08 DIAGNOSIS — M5030 Other cervical disc degeneration, unspecified cervical region: Secondary | ICD-10-CM | POA: Diagnosis not present

## 2018-08-08 DIAGNOSIS — M545 Low back pain: Secondary | ICD-10-CM | POA: Diagnosis not present

## 2018-08-08 DIAGNOSIS — M5412 Radiculopathy, cervical region: Secondary | ICD-10-CM | POA: Diagnosis not present

## 2018-08-20 DIAGNOSIS — I129 Hypertensive chronic kidney disease with stage 1 through stage 4 chronic kidney disease, or unspecified chronic kidney disease: Secondary | ICD-10-CM | POA: Diagnosis not present

## 2018-08-20 DIAGNOSIS — N183 Chronic kidney disease, stage 3 (moderate): Secondary | ICD-10-CM | POA: Diagnosis not present

## 2018-08-20 DIAGNOSIS — E872 Acidosis: Secondary | ICD-10-CM | POA: Diagnosis not present

## 2018-09-26 DIAGNOSIS — M545 Low back pain: Secondary | ICD-10-CM | POA: Diagnosis not present

## 2018-09-26 DIAGNOSIS — M542 Cervicalgia: Secondary | ICD-10-CM | POA: Diagnosis not present

## 2018-10-06 DIAGNOSIS — E785 Hyperlipidemia, unspecified: Secondary | ICD-10-CM | POA: Diagnosis not present

## 2018-10-06 DIAGNOSIS — J449 Chronic obstructive pulmonary disease, unspecified: Secondary | ICD-10-CM | POA: Diagnosis not present

## 2018-10-06 DIAGNOSIS — B029 Zoster without complications: Secondary | ICD-10-CM | POA: Diagnosis not present

## 2018-10-06 DIAGNOSIS — M159 Polyosteoarthritis, unspecified: Secondary | ICD-10-CM | POA: Diagnosis not present

## 2018-10-28 DIAGNOSIS — E785 Hyperlipidemia, unspecified: Secondary | ICD-10-CM | POA: Diagnosis not present

## 2018-10-28 DIAGNOSIS — M159 Polyosteoarthritis, unspecified: Secondary | ICD-10-CM | POA: Diagnosis not present

## 2018-10-28 DIAGNOSIS — L309 Dermatitis, unspecified: Secondary | ICD-10-CM | POA: Diagnosis not present

## 2018-10-28 DIAGNOSIS — J449 Chronic obstructive pulmonary disease, unspecified: Secondary | ICD-10-CM | POA: Diagnosis not present

## 2018-10-28 DIAGNOSIS — I129 Hypertensive chronic kidney disease with stage 1 through stage 4 chronic kidney disease, or unspecified chronic kidney disease: Secondary | ICD-10-CM | POA: Diagnosis not present

## 2018-10-28 DIAGNOSIS — R5382 Chronic fatigue, unspecified: Secondary | ICD-10-CM | POA: Diagnosis not present

## 2018-11-11 DIAGNOSIS — J449 Chronic obstructive pulmonary disease, unspecified: Secondary | ICD-10-CM | POA: Diagnosis not present

## 2018-11-11 DIAGNOSIS — J111 Influenza due to unidentified influenza virus with other respiratory manifestations: Secondary | ICD-10-CM | POA: Diagnosis not present

## 2018-11-26 DIAGNOSIS — J301 Allergic rhinitis due to pollen: Secondary | ICD-10-CM | POA: Diagnosis not present

## 2018-11-26 DIAGNOSIS — J4520 Mild intermittent asthma, uncomplicated: Secondary | ICD-10-CM | POA: Diagnosis not present

## 2018-11-26 DIAGNOSIS — J471 Bronchiectasis with (acute) exacerbation: Secondary | ICD-10-CM | POA: Diagnosis not present

## 2018-11-28 DIAGNOSIS — J4520 Mild intermittent asthma, uncomplicated: Secondary | ICD-10-CM | POA: Diagnosis not present

## 2018-11-28 DIAGNOSIS — J301 Allergic rhinitis due to pollen: Secondary | ICD-10-CM | POA: Diagnosis not present

## 2018-12-17 DIAGNOSIS — J0100 Acute maxillary sinusitis, unspecified: Secondary | ICD-10-CM | POA: Diagnosis not present

## 2018-12-17 DIAGNOSIS — H66009 Acute suppurative otitis media without spontaneous rupture of ear drum, unspecified ear: Secondary | ICD-10-CM | POA: Diagnosis not present

## 2019-01-20 DIAGNOSIS — H9190 Unspecified hearing loss, unspecified ear: Secondary | ICD-10-CM | POA: Diagnosis not present

## 2019-01-20 DIAGNOSIS — J302 Other seasonal allergic rhinitis: Secondary | ICD-10-CM | POA: Diagnosis not present

## 2019-01-20 DIAGNOSIS — J342 Deviated nasal septum: Secondary | ICD-10-CM | POA: Diagnosis not present

## 2019-01-20 DIAGNOSIS — H9319 Tinnitus, unspecified ear: Secondary | ICD-10-CM | POA: Diagnosis not present

## 2019-01-20 DIAGNOSIS — H938X9 Other specified disorders of ear, unspecified ear: Secondary | ICD-10-CM | POA: Diagnosis not present

## 2019-02-24 DIAGNOSIS — I6521 Occlusion and stenosis of right carotid artery: Secondary | ICD-10-CM | POA: Diagnosis not present

## 2019-02-24 DIAGNOSIS — E038 Other specified hypothyroidism: Secondary | ICD-10-CM | POA: Diagnosis not present

## 2019-02-24 DIAGNOSIS — R5383 Other fatigue: Secondary | ICD-10-CM | POA: Diagnosis not present

## 2019-02-24 DIAGNOSIS — E559 Vitamin D deficiency, unspecified: Secondary | ICD-10-CM | POA: Diagnosis not present

## 2019-02-24 DIAGNOSIS — E782 Mixed hyperlipidemia: Secondary | ICD-10-CM | POA: Diagnosis not present

## 2019-02-24 DIAGNOSIS — R202 Paresthesia of skin: Secondary | ICD-10-CM | POA: Diagnosis not present

## 2019-02-24 DIAGNOSIS — E441 Mild protein-calorie malnutrition: Secondary | ICD-10-CM | POA: Diagnosis not present

## 2019-02-24 DIAGNOSIS — J4540 Moderate persistent asthma, uncomplicated: Secondary | ICD-10-CM | POA: Diagnosis not present

## 2019-02-24 DIAGNOSIS — N183 Chronic kidney disease, stage 3 (moderate): Secondary | ICD-10-CM | POA: Diagnosis not present

## 2019-03-17 DIAGNOSIS — I6521 Occlusion and stenosis of right carotid artery: Secondary | ICD-10-CM | POA: Diagnosis not present

## 2019-03-17 DIAGNOSIS — I6523 Occlusion and stenosis of bilateral carotid arteries: Secondary | ICD-10-CM | POA: Diagnosis not present

## 2019-04-26 DIAGNOSIS — S01111A Laceration without foreign body of right eyelid and periocular area, initial encounter: Secondary | ICD-10-CM | POA: Diagnosis not present

## 2019-05-05 DIAGNOSIS — L239 Allergic contact dermatitis, unspecified cause: Secondary | ICD-10-CM | POA: Diagnosis not present

## 2019-06-05 DIAGNOSIS — Z23 Encounter for immunization: Secondary | ICD-10-CM | POA: Diagnosis not present

## 2019-06-29 DIAGNOSIS — Z961 Presence of intraocular lens: Secondary | ICD-10-CM | POA: Diagnosis not present

## 2019-06-29 DIAGNOSIS — H43813 Vitreous degeneration, bilateral: Secondary | ICD-10-CM | POA: Diagnosis not present

## 2019-07-09 DIAGNOSIS — L218 Other seborrheic dermatitis: Secondary | ICD-10-CM | POA: Diagnosis not present

## 2019-08-19 ENCOUNTER — Other Ambulatory Visit: Payer: Self-pay

## 2019-08-26 DIAGNOSIS — J4540 Moderate persistent asthma, uncomplicated: Secondary | ICD-10-CM | POA: Diagnosis not present

## 2019-08-26 DIAGNOSIS — I6521 Occlusion and stenosis of right carotid artery: Secondary | ICD-10-CM | POA: Diagnosis not present

## 2019-08-26 DIAGNOSIS — E782 Mixed hyperlipidemia: Secondary | ICD-10-CM | POA: Diagnosis not present

## 2019-08-26 DIAGNOSIS — M810 Age-related osteoporosis without current pathological fracture: Secondary | ICD-10-CM | POA: Diagnosis not present

## 2019-08-26 DIAGNOSIS — E441 Mild protein-calorie malnutrition: Secondary | ICD-10-CM | POA: Diagnosis not present

## 2019-08-26 DIAGNOSIS — G63 Polyneuropathy in diseases classified elsewhere: Secondary | ICD-10-CM | POA: Diagnosis not present

## 2019-08-26 DIAGNOSIS — N1831 Chronic kidney disease, stage 3a: Secondary | ICD-10-CM | POA: Diagnosis not present

## 2019-10-02 DIAGNOSIS — K5904 Chronic idiopathic constipation: Secondary | ICD-10-CM | POA: Diagnosis not present

## 2019-10-09 DIAGNOSIS — J018 Other acute sinusitis: Secondary | ICD-10-CM | POA: Diagnosis not present

## 2019-10-09 DIAGNOSIS — H9209 Otalgia, unspecified ear: Secondary | ICD-10-CM | POA: Diagnosis not present

## 2019-10-14 DIAGNOSIS — H9209 Otalgia, unspecified ear: Secondary | ICD-10-CM | POA: Diagnosis not present

## 2019-10-14 DIAGNOSIS — J018 Other acute sinusitis: Secondary | ICD-10-CM | POA: Diagnosis not present

## 2019-10-14 DIAGNOSIS — H6981 Other specified disorders of Eustachian tube, right ear: Secondary | ICD-10-CM | POA: Diagnosis not present

## 2019-10-23 DIAGNOSIS — M1990 Unspecified osteoarthritis, unspecified site: Secondary | ICD-10-CM | POA: Diagnosis not present

## 2019-10-23 DIAGNOSIS — M542 Cervicalgia: Secondary | ICD-10-CM | POA: Diagnosis not present

## 2019-10-23 DIAGNOSIS — H938X9 Other specified disorders of ear, unspecified ear: Secondary | ICD-10-CM | POA: Diagnosis not present

## 2019-10-23 DIAGNOSIS — H9209 Otalgia, unspecified ear: Secondary | ICD-10-CM | POA: Diagnosis not present

## 2019-11-09 DIAGNOSIS — K5904 Chronic idiopathic constipation: Secondary | ICD-10-CM | POA: Diagnosis not present

## 2020-01-28 ENCOUNTER — Other Ambulatory Visit: Payer: Self-pay | Admitting: Family Medicine

## 2020-02-23 ENCOUNTER — Encounter: Payer: Self-pay | Admitting: Family Medicine

## 2020-02-23 NOTE — Progress Notes (Signed)
Subjective:  Patient ID: Valerie Kelley, female    DOB: 1933/12/15  Age: 86 y.o. MRN: [id number]  Chief Complaint  Patient presents with  . Hyperlipidemia  . Hypothyroidism    HPI   Valerie Kelley presents with a diagnosis of moderate persistent asthma, uncomplicated.  well controlled on symbicort, spiriva, and ventolin. Denies dyspnea.    Pt presents with hyperlipidemia.  Current treatment includes fish oil and a low cholesterol/low fat diet.  She denies experiencing any hypercholesterolemia related symptoms.      Valerie Kelley presents with a diagnosis of occlusion and stenosis of right carotid artery.  This was diagnosed one year ago.  There are no associated symptoms.  Patient has been intolerant to statins.  Carotids rechecked 02/2019.     Dx with age-related osteoporosis without current pathological fracture; this is an established diagnosis of osteoporosis that was made in 2020.  This diagnosis was made [facility] screening..    Asthma well controlled on medications. Currently on symbicort and ventolin. She sees Dr. Alcide Clever.   Past Medical History:  Diagnosis Date  . Arthritis    "all over" (04/15/2013)  . Asthma   . Emphysema    "mild" (04/15/2013)  . Exertional shortness of breath   . Gastric paresis   . GERD (gastroesophageal reflux disease)   . H/O hiatal hernia   . High cholesterol   . Hypothyroidism   . Stage III chronic kidney disease    Past Surgical History:  Procedure Laterality Date  . ABDOMINAL HYSTERECTOMY    . REVERSE SHOULDER ARTHROPLASTY Left 04/15/2013  . REVERSE SHOULDER ARTHROPLASTY Left 04/15/2013   Procedure: REVERSE SHOULDER ARTHROPLASTY LEFT;  Surgeon: Augustin Schooling, MD;  Location: [facility];  Service: Orthopedics;  Laterality: Left;  . SINUS EXPLORATION    . TONSILLECTOMY      Family History  Problem Relation Age of Onset  . Stroke Mother   . Cancer Brother        brain  . Cancer Brother        leukemia  . Cancer Brother        lung   Social History    Socioeconomic History  . Marital status: Widowed    Spouse name: Not on file  . Number of children: Not on file  . Years of education: Not on file  . Highest education level: Not on file  Occupational History  . Not on file  Tobacco Use  . Smoking status: Never Smoker  . Smokeless tobacco: Never Used  Substance and Sexual Activity  . Alcohol use: No  . Drug use: No  . Sexual activity: Never  Other Topics Concern  . Not on file  Social History Narrative  . Not on file   Social Determinants of Health   Financial Resource Strain:   . Difficulty of Paying Living Expenses:   Food Insecurity:   . Worried About Charity fundraiser in the Last Year:   . Arboriculturist in the Last Year:   Transportation Needs:   . Film/video editor (Medical):   Marland Kitchen Lack of Transportation (Non-Medical):   Physical Activity:   . Days of Exercise per Week:   . Minutes of Exercise per Session:   Stress:   . Feeling of Stress :   Social Connections:   . Frequency of Communication with Friends and Family:   . Frequency of Social Gatherings with Friends and Family:   . Attends Religious Services:   . Active Member  of Clubs or Organizations:   . Attends Archivist Meetings:   Marland Kitchen Marital Status:     Review of Systems  Constitutional: Negative for chills, fatigue and fever.  HENT: Negative for congestion, ear pain, rhinorrhea and sore throat.   Respiratory: Negative for cough and shortness of breath.   Cardiovascular: Negative for chest pain.  Gastrointestinal: Positive for constipation (BMS - 3 daily with senakot. She does not like the side effects of nausea, stomach cramps, decrease appetite. Miralax intermittently. Goes to [facility]. Gastroenterology - Dr. Lacinda Axon. ). Negative for abdominal pain, diarrhea, nausea and vomiting.  Genitourinary: Negative for dysuria and urgency.  Musculoskeletal: Positive for arthralgias (neck, hips, shoulders, hands. ). Negative for back pain and  myalgias.  Neurological: Positive for numbness (hands numb BL. Has not been back to see Dr. Marlou Sa (neurology.) He was seeing her for cervical radiculopathy . She is dropping things. ). Negative for dizziness, weakness, light-headedness and headaches.  Psychiatric/Behavioral: Negative for dysphoric mood. The patient is not nervous/anxious.      Objective:  BP (!) 128/54   Pulse 65   Temp (!) 97.1 F (36.2 C)   Ht 4\' 10"  (1.473 m)   Wt 86 lb (39 kg)   SpO2 100%   BMI 17.97 kg/m   BP/Weight 02/24/2020 04/17/2013 0/24/0973  Systolic BP 532 992 426  Diastolic BP 54 53 64  Wt. (Lbs) 86 - 141.09  BMI 17.97 - 29.5    Physical Exam Vitals reviewed.  Constitutional:      Appearance: Normal appearance. She is normal weight.  Cardiovascular:     Rate and Rhythm: Normal rate and regular rhythm.     Pulses: Normal pulses.     Heart sounds: Normal heart sounds.  Pulmonary:     Effort: Pulmonary effort is normal. No respiratory distress.     Breath sounds: Normal breath sounds.  Abdominal:     General: Abdomen is flat. Bowel sounds are normal.     Palpations: Abdomen is soft.     Tenderness: There is no abdominal tenderness.  Neurological:     Mental Status: She is alert and oriented to person, place, and time.  Psychiatric:        Mood and Affect: Mood normal.        Behavior: Behavior normal.     Diabetic Foot Exam - Simple   No data filed       Lab Results  Component Value Date   WBC 7.8 04/09/2013   HGB 10.3 (L) 04/16/2013   HCT 29.5 (L) 04/16/2013   PLT 212 04/09/2013   GLUCOSE 95 04/16/2013   NA 136 04/16/2013   K 4.1 04/16/2013   CL 106 04/16/2013   CREATININE 0.93 04/16/2013   BUN 17 04/16/2013   CO2 24 04/16/2013      Assessment & Plan:   1. Mixed hyperlipidemia Well controlled.  No changes to medicines.  Continue to work on eating a healthy diet and exercise.  Labs drawn today.  - CBC with Differential/Platelet - Lipid panel  2. Hypothyroidism,  unspecified type The current medical regimen is effective;  continue present plan and medications. - Comprehensive metabolic panel - TSH  3. Gastroparesis Call for appt with Dr. Lacinda Axon.  4. Chronic idiopathic constipation  Call for appt with dr. Lacinda Axon.   5. Paresthesias of hands. - Call Dr. Marlou Sa for follow up.   No orders of the defined types were placed in this encounter.   Orders Placed This Encounter  Procedures  . CBC with Differential/Platelet  . Comprehensive metabolic panel  . Lipid panel  . TSH     Follow-up: Return in about 6 months (around 08/25/2020) for fasting visit. Schedule Annual Wellness Visit with Valerie Kelley this summer .  An After Visit Summary was printed and given to the patient.  [facility] [phone]

## 2020-02-24 ENCOUNTER — Ambulatory Visit (INDEPENDENT_AMBULATORY_CARE_PROVIDER_SITE_OTHER): Payer: Medicare Other | Admitting: Family Medicine

## 2020-02-24 ENCOUNTER — Encounter: Payer: Self-pay | Admitting: Family Medicine

## 2020-02-24 ENCOUNTER — Other Ambulatory Visit: Payer: Self-pay

## 2020-02-24 VITALS — BP 128/54 | HR 65 | Temp 97.1°F | Ht <= 58 in | Wt 86.0 lb

## 2020-02-24 DIAGNOSIS — K5904 Chronic idiopathic constipation: Secondary | ICD-10-CM

## 2020-02-24 DIAGNOSIS — E7849 Other hyperlipidemia: Secondary | ICD-10-CM

## 2020-02-24 DIAGNOSIS — E782 Mixed hyperlipidemia: Secondary | ICD-10-CM | POA: Diagnosis not present

## 2020-02-24 DIAGNOSIS — E039 Hypothyroidism, unspecified: Secondary | ICD-10-CM | POA: Diagnosis not present

## 2020-02-24 DIAGNOSIS — K3184 Gastroparesis: Secondary | ICD-10-CM

## 2020-02-24 HISTORY — DX: Hypothyroidism, unspecified: E03.9

## 2020-02-24 HISTORY — DX: Other hyperlipidemia: E78.49

## 2020-02-24 HISTORY — DX: Gastroparesis: K31.84

## 2020-02-24 HISTORY — DX: Chronic idiopathic constipation: K59.04

## 2020-02-24 NOTE — Patient Instructions (Signed)
Recommendations: Call Dr. Lacinda Axon for follow up.  Call Dr. Marlou Sa for follow up.

## 2020-02-25 LAB — CBC WITH DIFFERENTIAL/PLATELET
Basophils Absolute: 0 10*3/uL (ref 0.0–0.2)
Basos: 0 %
EOS (ABSOLUTE): 0.1 10*3/uL (ref 0.0–0.4)
Eos: 1 %
Hematocrit: 38.8 % (ref 34.0–46.6)
Hemoglobin: 12.8 g/dL (ref 11.1–15.9)
Immature Grans (Abs): 0 10*3/uL (ref 0.0–0.1)
Immature Granulocytes: 0 %
Lymphocytes Absolute: 1.9 10*3/uL (ref 0.7–3.1)
Lymphs: 35 %
MCH: 31.4 pg (ref 26.6–33.0)
MCHC: 33 g/dL (ref 31.5–35.7)
MCV: 95 fL (ref 79–97)
Monocytes Absolute: 0.8 10*3/uL (ref 0.1–0.9)
Monocytes: 15 %
Neutrophils Absolute: 2.7 10*3/uL (ref 1.4–7.0)
Neutrophils: 49 %
Platelets: 234 10*3/uL (ref 150–450)
RBC: 4.08 x10E6/uL (ref 3.77–5.28)
RDW: 12.2 % (ref 11.7–15.4)
WBC: 5.5 10*3/uL (ref 3.4–10.8)

## 2020-02-25 LAB — COMPREHENSIVE METABOLIC PANEL
ALT: 15 IU/L (ref 0–32)
AST: 27 IU/L (ref 0–40)
Albumin/Globulin Ratio: 2 (ref 1.2–2.2)
Albumin: 4.7 g/dL — ABNORMAL HIGH (ref 3.6–4.6)
Alkaline Phosphatase: 88 IU/L (ref 48–121)
BUN/Creatinine Ratio: 15 (ref 12–28)
BUN: 21 mg/dL (ref 8–27)
Bilirubin Total: 0.4 mg/dL (ref 0.0–1.2)
CO2: 21 mmol/L (ref 20–29)
Calcium: 9.6 mg/dL (ref 8.7–10.3)
Chloride: 102 mmol/L (ref 96–106)
Creatinine, Ser: 1.38 mg/dL — ABNORMAL HIGH (ref 0.57–1.00)
GFR calc Af Amer: 41 mL/min/{1.73_m2} — ABNORMAL LOW (ref >59)
GFR calc non Af Amer: 35 mL/min/{1.73_m2} — ABNORMAL LOW (ref >59)
Globulin, Total: 2.3 g/dL (ref 1.5–4.5)
Glucose: 92 mg/dL (ref 65–99)
Potassium: 4.3 mmol/L (ref 3.5–5.2)
Sodium: 137 mmol/L (ref 134–144)
Total Protein: 7 g/dL (ref 6.0–8.5)

## 2020-02-25 LAB — LIPID PANEL
Chol/HDL Ratio: 4.2 ratio (ref 0.0–4.4)
Cholesterol, Total: 291 mg/dL — ABNORMAL HIGH (ref 100–199)
HDL: 69 mg/dL (ref >39)
LDL Chol Calc (NIH): 198 mg/dL — ABNORMAL HIGH (ref 0–99)
Triglycerides: 134 mg/dL (ref 0–149)
VLDL Cholesterol Cal: 24 mg/dL (ref 5–40)

## 2020-02-25 LAB — CARDIOVASCULAR RISK ASSESSMENT

## 2020-02-25 LAB — TSH: TSH: 4.17 u[IU]/mL (ref 0.450–4.500)

## 2020-02-26 ENCOUNTER — Other Ambulatory Visit: Payer: Self-pay

## 2020-02-26 ENCOUNTER — Other Ambulatory Visit: Payer: Self-pay | Admitting: Family Medicine

## 2020-02-26 DIAGNOSIS — R799 Abnormal finding of blood chemistry, unspecified: Secondary | ICD-10-CM

## 2020-02-26 DIAGNOSIS — N182 Chronic kidney disease, stage 2 (mild): Secondary | ICD-10-CM

## 2020-03-10 ENCOUNTER — Other Ambulatory Visit: Payer: Medicare Other

## 2020-03-10 ENCOUNTER — Other Ambulatory Visit: Payer: Self-pay

## 2020-03-10 DIAGNOSIS — N182 Chronic kidney disease, stage 2 (mild): Secondary | ICD-10-CM

## 2020-03-11 LAB — COMPREHENSIVE METABOLIC PANEL
ALT: 13 IU/L (ref 0–32)
AST: 22 IU/L (ref 0–40)
Albumin/Globulin Ratio: 1.8 (ref 1.2–2.2)
Albumin: 4.8 g/dL — ABNORMAL HIGH (ref 3.6–4.6)
Alkaline Phosphatase: 86 IU/L (ref 48–121)
BUN/Creatinine Ratio: 10 — ABNORMAL LOW (ref 12–28)
BUN: 15 mg/dL (ref 8–27)
Bilirubin Total: 0.4 mg/dL (ref 0.0–1.2)
CO2: 21 mmol/L (ref 20–29)
Calcium: 10 mg/dL (ref 8.7–10.3)
Chloride: 101 mmol/L (ref 96–106)
Creatinine, Ser: 1.5 mg/dL — ABNORMAL HIGH (ref 0.57–1.00)
GFR calc Af Amer: 37 mL/min/{1.73_m2} — ABNORMAL LOW (ref >59)
GFR calc non Af Amer: 32 mL/min/{1.73_m2} — ABNORMAL LOW (ref >59)
Globulin, Total: 2.6 g/dL (ref 1.5–4.5)
Glucose: 75 mg/dL (ref 65–99)
Potassium: 4 mmol/L (ref 3.5–5.2)
Sodium: 140 mmol/L (ref 134–144)
Total Protein: 7.4 g/dL (ref 6.0–8.5)

## 2020-03-14 DIAGNOSIS — K5900 Constipation, unspecified: Secondary | ICD-10-CM | POA: Diagnosis not present

## 2020-03-14 DIAGNOSIS — Z880 Allergy status to penicillin: Secondary | ICD-10-CM | POA: Diagnosis not present

## 2020-03-14 DIAGNOSIS — Z886 Allergy status to analgesic agent status: Secondary | ICD-10-CM | POA: Diagnosis not present

## 2020-03-14 DIAGNOSIS — R634 Abnormal weight loss: Secondary | ICD-10-CM | POA: Diagnosis not present

## 2020-03-14 DIAGNOSIS — R110 Nausea: Secondary | ICD-10-CM | POA: Diagnosis not present

## 2020-03-14 DIAGNOSIS — R630 Anorexia: Secondary | ICD-10-CM | POA: Diagnosis not present

## 2020-03-14 DIAGNOSIS — Z885 Allergy status to narcotic agent status: Secondary | ICD-10-CM | POA: Diagnosis not present

## 2020-03-14 DIAGNOSIS — Z888 Allergy status to other drugs, medicaments and biological substances status: Secondary | ICD-10-CM | POA: Diagnosis not present

## 2020-03-22 ENCOUNTER — Ambulatory Visit (INDEPENDENT_AMBULATORY_CARE_PROVIDER_SITE_OTHER): Payer: Medicare Other

## 2020-03-22 ENCOUNTER — Other Ambulatory Visit: Payer: Self-pay

## 2020-03-22 VITALS — BP 132/68 | HR 112 | Temp 97.9°F | Resp 16 | Ht <= 58 in | Wt 83.5 lb

## 2020-03-22 DIAGNOSIS — Z1231 Encounter for screening mammogram for malignant neoplasm of breast: Secondary | ICD-10-CM | POA: Diagnosis not present

## 2020-03-22 DIAGNOSIS — Z780 Asymptomatic menopausal state: Secondary | ICD-10-CM

## 2020-03-22 DIAGNOSIS — M8588 Other specified disorders of bone density and structure, other site: Secondary | ICD-10-CM

## 2020-03-22 DIAGNOSIS — Z0000 Encounter for general adult medical examination without abnormal findings: Secondary | ICD-10-CM

## 2020-03-22 DIAGNOSIS — Z23 Encounter for immunization: Secondary | ICD-10-CM | POA: Diagnosis not present

## 2020-03-22 DIAGNOSIS — M8580 Other specified disorders of bone density and structure, unspecified site: Secondary | ICD-10-CM | POA: Diagnosis not present

## 2020-03-22 NOTE — Patient Instructions (Signed)
Immunization Schedule, 65 Years and Older  Vaccines are usually given at various ages, according to a schedule. Your health care provider will recommend vaccines for you based on your age, medical history, and lifestyle or other factors such as travel or where you work. You may receive vaccines as individual doses or as more than one vaccine together in one shot (combination vaccines). Talk with your health care provider about the risks and benefits of combination vaccines. Recommended immunizations for 61 and older Influenza vaccine  You should get a dose of the influenza vaccine every year. Tetanus, diphtheria, and pertussis vaccine A vaccine that protects against tetanus, diphtheria, and pertussis is known as the Tdap vaccine. A vaccine that protects against tetanus and diphtheria is known as the Td vaccine.  You should only get the Td vaccine if you have had at least 1 dose of the Tdap vaccine.  You should get 1 dose of the Td or Tdap vaccine every 10 years, or you should get 1 dose of the Tdap vaccine if: ? You have not previously gotten a Tdap vaccine. ? You do not know if you have ever gotten a Tdap vaccine. Zoster vaccine This is also known as the RZV vaccine. You should get 2 doses of the RZV vaccine 2 to 6 months apart. It is important to get the RZV vaccine even if you:  Have had shingles.  Have received the ZVL vaccine, an older version of the RZV vaccine.  Are unsure if you have had chickenpox (varicella). Pneumococcal polysaccharide vaccine This is also known as the PPSV23 vaccine. You should get 1 dose of the PPSV23 vaccine. Pneumococcal conjugate vaccine This is also known as the PCV13 vaccine. Talk to your health care provider about whether it is appropriate for you to get the PCV13 vaccine. Hepatitis A vaccine This is also known as the HepA vaccine. If you did not get the HepA vaccine previously, you should get it if:  You are at risk for a hepatitis A infection. You  may be at risk for infection if you: ? Have chronic liver disease. ? Have HIV or AIDS. ? Are a man who has sex with men. ? Use drugs. ? Are homeless. ? May be exposed to hepatitis A through work. ? Travel to countries where hepatitis A is common. ? Have or will have close contact with someone who was adopted from another country.  You are not at risk for infection but want protection from hepatitis A. Hepatitis B vaccine This is also known as the HepB vaccine. If you did not get the HepB vaccine previously, you should get it if:  You are at risk for hepatitis B infection. You are at risk if you: ? Have chronic liver disease. ? Have HIV or AIDS. ? Have sex with a partner who has hepatitis B, or:  You have multiple sex partners.  You are a man who has sex with men. ? Use drugs. ? May be exposed to hepatitis B through work. ? Live with someone who has hepatitis B. ? Receive dialysis treatment. ? Have diabetes. ? Travel to countries where hepatitis B is common.  You are not at risk of infection but want protection from hepatitis B. Varicella vaccine This is also known as the VAR vaccine. You may need to get the VAR vaccine if you do not have evidence of immunity (by a blood test) and you may be exposed to varicella through work. This is especially important if you work  in a health care setting. Meningococcal conjugate vaccine This is also known as the MenACWY vaccine. You may need to get the MenACWY vaccine if you:  Have not been given the vaccine before.  Need to catch up on doses you missed in the past. This vaccine is especially important if you:  Do not have a spleen.  Have sickle cell disease.  Have HIV.  Take medicines that suppress your body's disease-fighting system (immune system).  Travel to countries where meningococcal disease is common.  Are exposed to Neisseria meningitidis at work. Serogroup B meningococcal vaccine This is also known as the MenB vaccine.  You may need to get the MenB vaccine if you:  Have not been given the vaccine before.  Need to catch up on doses you missed in the past. This vaccine is especially important if you:  Do not have a spleen.  Have sickle cell disease.  Take medicines that suppress your immune system.  Are exposed to Neisseria meningitidis at work. Haemophilus influenzae type b vaccine This is also known as the Hib vaccine. Anyone older than 84 years of age is usually not given the Hib vaccine. However, if you have certain high-risk conditions, you may need to get this vaccine. These conditions include:  Not having a spleen.  Having received a stem cell transplant. Before you get a vaccine: Talk with your health care provider about which vaccines are right for you. This is especially important if:  You previously had a reaction after getting a vaccine.  You have a weakened immune system. You may have a weakened immune system if you: ? Are taking medicines that reduce (suppress) the activity of your immune system. ? Are taking medicines to treat cancer (chemotherapy). ? Have HIV or AIDS.  You work in an environment where you may be exposed to a disease.  You plan to travel outside of the country.  You have a chronic illness, such as heart disease, kidney disease, diabetes, or lung disease. Summary  Before you get a vaccine, tell your health care provider if you have reacted to vaccines in the past or have a condition that weakens your immune system.  You should get a dose of the influenza vaccine every year and a dose of the Td or Tdap vaccine every 10 years.  You should get 2 doses of the RZV vaccine 2 to 6 months apart.  Depending on your medical history and your risk factors, you may need other vaccines. Ask your health care provider whether you are up to date on all your vaccines. This information is not intended to replace advice given to you by your health care provider. Make sure you  discuss any questions you have with your health care provider. Document Revised: 07/07/2019 Document Reviewed: 07/07/2019 Elsevier Patient Education  [place] Maintenance After Age 53 After age 33, you are at a higher risk for certain long-term diseases and infections as well as injuries from falls. Falls are a major cause of broken bones and head injuries in people who are older than age 20. Getting regular preventive care can help to keep you healthy and well. Preventive care includes getting regular testing and making lifestyle changes as recommended by your health care provider. Talk with your health care provider about:  Which screenings and tests you should have. A screening is a test that checks for a disease when you have no symptoms.  A diet and exercise plan that is right for you.  What should I know about screenings and tests to prevent falls? Screening and testing are the best ways to find a health problem early. Early diagnosis and treatment give you the best chance of managing medical conditions that are common after age 51. Certain conditions and lifestyle choices may make you more likely to have a fall. Your health care provider may recommend:  Regular vision checks. Poor vision and conditions such as cataracts can make you more likely to have a fall. If you wear glasses, make sure to get your prescription updated if your vision changes.  Medicine review. Work with your health care provider to regularly review all of the medicines you are taking, including over-the-counter medicines. Ask your health care provider about any side effects that may make you more likely to have a fall. Tell your health care provider if any medicines that you take make you feel dizzy or sleepy.  Osteoporosis screening. Osteoporosis is a condition that causes the bones to get weaker. This can make the bones weak and cause them to break more easily.  Blood pressure screening. Blood  pressure changes and medicines to control blood pressure can make you feel dizzy.  Strength and balance checks. Your health care provider may recommend certain tests to check your strength and balance while standing, walking, or changing positions.  Foot health exam. Foot pain and numbness, as well as not wearing proper footwear, can make you more likely to have a fall.  Depression screening. You may be more likely to have a fall if you have a fear of falling, feel emotionally low, or feel unable to do activities that you used to do.  Alcohol use screening. Using too much alcohol can affect your balance and may make you more likely to have a fall. What actions can I take to lower my risk of falls? General instructions  Talk with your health care provider about your risks for falling. Tell your health care provider if: ? You fall. Be sure to tell your health care provider about all falls, even ones that seem minor. ? You feel dizzy, sleepy, or off-balance.  Take over-the-counter and prescription medicines only as told by your health care provider. These include any supplements.  Eat a healthy diet and maintain a healthy weight. A healthy diet includes low-fat dairy products, low-fat (lean) meats, and fiber from whole grains, beans, and lots of fruits and vegetables. Home safety  Remove any tripping hazards, such as rugs, cords, and clutter.  Install safety equipment such as grab bars in bathrooms and safety rails on stairs.  Keep rooms and walkways well-lit. Activity   Follow a regular exercise program to stay fit. This will help you maintain your balance. Ask your health care provider what types of exercise are appropriate for you.  If you need a cane or walker, use it as recommended by your health care provider.  Wear supportive shoes that have nonskid soles. Lifestyle  Do not drink alcohol if your health care provider tells you not to drink.  If you drink alcohol, limit how  much you have: ? 0-1 drink a day for women. ? 0-2 drinks a day for men.  Be aware of how much alcohol is in your drink. In the [place] one drink equals one typical bottle of beer (12 oz), one-half glass of wine (5 oz), or one shot of hard liquor (1 oz).  Do not use any products that contain nicotine or tobacco, such as cigarettes and e-cigarettes. If you  need help quitting, ask your health care provider. Summary  Having a healthy lifestyle and getting preventive care can help to protect your health and wellness after age 46.  Screening and testing are the best way to find a health problem early and help you avoid having a fall. Early diagnosis and treatment give you the best chance for managing medical conditions that are more common for people who are older than age 33.  Falls are a major cause of broken bones and head injuries in people who are older than age 84. Take precautions to prevent a fall at home.  Work with your health care provider to learn what changes you can make to improve your health and wellness and to prevent falls. This information is not intended to replace advice given to you by your health care provider. Make sure you discuss any questions you have with your health care provider. Document Revised: 01/01/2019 Document Reviewed: 07/24/2017 Elsevier Patient Education  [place]. Pneumococcal Polysaccharide Vaccine (PPSV23): What You Need to Know 1. Why get vaccinated? Pneumococcal polysaccharide vaccine (PPSV23) can prevent pneumococcal disease. Pneumococcal disease refers to any illness caused by pneumococcal bacteria. These bacteria can cause many types of illnesses, including pneumonia, which is an infection of the lungs. Pneumococcal bacteria are one of the most common causes of pneumonia. Besides pneumonia, pneumococcal bacteria can also cause:  Ear infections  Sinus infections  Meningitis (infection of the tissue covering the brain and spinal  cord)  Bacteremia (bloodstream infection) Anyone can get pneumococcal disease, but children under 77 years of age, people with certain medical conditions, adults 39 years or older, and cigarette smokers are at the highest risk. Most pneumococcal infections are mild. However, some can result in long-term problems, such as brain damage or hearing loss. Meningitis, bacteremia, and pneumonia caused by pneumococcal disease can be fatal. 2. PPSV23 PPSV23 protects against 23 types of bacteria that cause pneumococcal disease. PPSV23 is recommended for:  All adults 37 years or older,  Anyone 2 years or older with certain medical conditions that can lead to an increased risk for pneumococcal disease. Most people need only one dose of PPSV23. A second dose of PPSV23, and another type of pneumococcal vaccine called PCV13, are recommended for certain high-risk groups. Your health care provider can give you more information. People 65 years or older should get a dose of PPSV23 even if they have already gotten one or more doses of the vaccine before they turned 17. 3. Talk with your health care provider Tell your vaccine provider if the person getting the vaccine:  Has had an allergic reaction after a previous dose of PPSV23, or has any severe, life-threatening allergies. In some cases, your health care provider may decide to postpone PPSV23 vaccination to a future visit. People with minor illnesses, such as a cold, may be vaccinated. People who are moderately or severely ill should usually wait until they recover before getting PPSV23. Your health care provider can give you more information. 4. Risks of a vaccine reaction  Redness or pain where the shot is given, feeling tired, fever, or muscle aches can happen after PPSV23. People sometimes faint after medical procedures, including vaccination. Tell your provider if you feel dizzy or have vision changes or ringing in the ears. As with any medicine, there  is a very remote chance of a vaccine causing a severe allergic reaction, other serious injury, or death. 5. What if there is a serious problem? An allergic reaction could occur  after the vaccinated person leaves the clinic. If you see signs of a severe allergic reaction (hives, swelling of the face and throat, difficulty breathing, a fast heartbeat, dizziness, or weakness), call 9-1-1 and get the person to the nearest hospital. For other signs that concern you, call your health care provider. Adverse reactions should be reported to the Vaccine Adverse Event Reporting System (VAERS). Your health care provider will usually file this report, or you can do it yourself. Visit the VAERS website at www.vaers.SamedayNews.es or call 725-122-9818. VAERS is only for reporting reactions, and VAERS staff do not give medical advice. 6. How can I learn more?  Ask your health care provider.  Call your local or state health department.  Contact the Centers for Disease Control and Prevention (CDC): ? Call 838 086 2251 (1-800-CDC-INFO) or ? Visit CDC's website at http://hunter.com/ CDC Vaccine Information Statement PPSV23 Vaccine (07/23/2018) This information is not intended to replace advice given to you by your health care provider. Make sure you discuss any questions you have with your health care provider. Document Revised: 12/30/2018 Document Reviewed: 04/22/2018 Elsevier Patient Education  [place].

## 2020-03-22 NOTE — Progress Notes (Signed)
Subjective:   Valerie Kelley is a 86 y.o. female who presents for Medicare Annual (Subsequent) preventive examination.  This wellness visit is conducted by a nurse.  The patient's medications were reviewed and reconciled since the patient's last visit.  History details were provided by the patient.  The history appears to be reliable.    Patient's last AWV was three years ago.   Medical History: Patient history and Family history was reviewed  Medications, Allergies, and preventative health maintenance was reviewed and updated.  Review of Systems    Review of Systems  Constitutional: Positive for fatigue and unexpected weight change.  HENT: Negative.   Eyes: Negative.   Respiratory: Positive for shortness of breath. Negative for cough.   Cardiovascular: Negative.  Negative for chest pain and palpitations.  Gastrointestinal: Negative.   Endocrine: Negative.   Genitourinary: Negative.   Musculoskeletal: Positive for arthralgias.  Neurological: Positive for weakness. Negative for dizziness, numbness and headaches.  Psychiatric/Behavioral: Negative.  Negative for confusion, dysphoric mood and suicidal ideas. The patient is not nervous/anxious.          Objective:    Today's Vitals   03/22/20 0951  BP: 132/68  Pulse: (!) 112  Resp: 16  Temp: 97.9 F (36.6 C)  TempSrc: Temporal  SpO2: (!) 88%  Weight: 83 lb 8 oz (37.9 kg)  Height: 4\' 10"  (1.473 m)  PainSc: 5    Body mass index is 17.45 kg/m.  Advanced Directives 03/22/2020 04/15/2013 04/09/2013  Does Patient Have a Medical Advance Directive? Yes Patient has advance directive, copy not in chart Patient has advance directive, copy not in chart  Type of Advance Directive [place];Living will - -  Does patient want to make changes to medical advance directive? No - Patient declined - -  Copy of [place] in Chart? No - copy requested Copy requested from other (Comment) -    Current  Medications (verified) Outpatient Encounter Medications as of 03/22/2020  Medication Sig  . acetaminophen (TYLENOL) 325 MG tablet Take 325 mg by mouth every evening. For pain  . albuterol (PROVENTIL HFA;VENTOLIN HFA) 108 (90 BASE) MCG/ACT inhaler Inhale 2 puffs into the lungs every 6 (six) hours as needed for wheezing. For wheezing  . b complex vitamins capsule Take by mouth.  . budesonide-formoterol (SYMBICORT) 160-4.5 MCG/ACT inhaler Inhale into the lungs.  . cholecalciferol (VITAMIN D) 1000 UNITS tablet Take 1,000 Units by mouth daily.  Marland Kitchen co-enzyme Q-10 30 MG capsule Take 30 mg by mouth daily.  . DULoxetine (CYMBALTA) 20 MG capsule TAKE 1 CAPSULE BY MOUTH TWICE A DAY  . famotidine (PEPCID) 40 MG tablet Take 40 mg by mouth 2 (two) times daily.  . fish oil-omega-3 fatty acids 1000 MG capsule Take 2 g by mouth daily.  Marland Kitchen gabapentin (NEURONTIN) 100 MG capsule Take by mouth.  . loratadine (CLARITIN) 10 MG tablet Take 10 mg by mouth daily.  Vladimir Faster Glycol-Propyl Glycol (SYSTANE OP) Place 1 drop into both eyes daily as needed. For dry/irritated eyes  . polyethylene glycol (MIRALAX / GLYCOLAX) packet Take 51 g by mouth daily. Takes 3 capfuls daily  . tiotropium (SPIRIVA) 18 MCG inhalation capsule Place 18 mcg into inhaler and inhale daily.  . traZODone (DESYREL) 50 MG tablet Take 50 mg by mouth at bedtime.  . vitamin E 400 UNIT capsule Take 800 Units by mouth daily.   No facility-administered encounter medications on file as of 03/22/2020.    Allergies (verified)  Codeine, Darvocet [propoxyphene n-acetaminophen], Other, Prednisone, Pregabalin, Shellfish allergy, and Penicillins   History: Past Medical History:  Diagnosis Date  . Arthritis    "all over" (04/15/2013)  . Asthma   . Emphysema    "mild" (04/15/2013)  . Exertional shortness of breath   . Gastric paresis   . GERD (gastroesophageal reflux disease)   . H/O hiatal hernia   . High cholesterol   . Hypothyroidism   . Stage III  chronic kidney disease    Past Surgical History:  Procedure Laterality Date  . ABDOMINAL HYSTERECTOMY    . REVERSE SHOULDER ARTHROPLASTY Left 04/15/2013  . REVERSE SHOULDER ARTHROPLASTY Left 04/15/2013   Procedure: REVERSE SHOULDER ARTHROPLASTY LEFT;  Surgeon: Augustin Schooling, MD;  Location: [facility];  Service: Orthopedics;  Laterality: Left;  . SINUS EXPLORATION    . TONSILLECTOMY     Family History  Problem Relation Age of Onset  . Stroke Mother   . Cancer Brother        brain  . Cancer Brother        leukemia  . Cancer Brother        lung   Social History   Socioeconomic History  . Marital status: Widowed    Spouse name: Not on file  . Number of children: Not on file  . Years of education: Not on file  . Highest education level: Not on file  Occupational History  . Not on file  Tobacco Use  . Smoking status: Never Smoker  . Smokeless tobacco: Never Used  Substance and Sexual Activity  . Alcohol use: No  . Drug use: No  . Sexual activity: Never  Other Topics Concern  . Not on file  Social History Narrative  . Not on file   Social Determinants of Health   Financial Resource Strain:   . Difficulty of Paying Living Expenses:   Food Insecurity:   . Worried About Charity fundraiser in the Last Year:   . Arboriculturist in the Last Year:   Transportation Needs:   . Film/video editor (Medical):   Marland Kitchen Lack of Transportation (Non-Medical):   Physical Activity:   . Days of Exercise per Week:   . Minutes of Exercise per Session:   Stress:   . Feeling of Stress :   Social Connections:   . Frequency of Communication with Friends and Family:   . Frequency of Social Gatherings with Friends and Family:   . Attends Religious Services:   . Active Member of Clubs or Organizations:   . Attends Archivist Meetings:   Marland Kitchen Marital Status:     Clinical Intake:  Pre-visit preparation completed: Yes  Pain : 0-10 Pain Score: 5  Pain Type: Chronic pain, Other  (Comment) (Arthritis)     Diabetes: No     Diabetic? No         Activities of Daily Living In your present state of health, do you have any difficulty performing the following activities: 03/22/2020 02/24/2020  Hearing? N N  Vision? N N  Difficulty concentrating or making decisions? N N  Walking or climbing stairs? N N  Dressing or bathing? N N  Doing errands, shopping? N N  Preparing Food and eating ? N -  Using the Toilet? N -  In the past six months, have you accidently leaked urine? Y -  Comment Urge incont. -  Do you have problems with loss of bowel control? N -  Managing your Medications? N -  Managing your Finances? N -  Housekeeping or managing your Housekeeping? N -  Some recent data might be hidden    Patient Care Team: Cox, Elnita Maxwell, MD as PCP - General (Family Medicine) Thereasa Distance, MD as Referring Physician (Nephrology)  Indicate any recent Medical Services you may have received from other than [facility] providers in the past year (date may be approximate).     Assessment:   This is a routine wellness examination for Jearlene.  Hearing/Vision screen No exam data present  Dietary issues and exercise activities discussed: Current Exercise Habits: Home exercise routine, Type of exercise: stretching;walking, Time (Minutes): 10, Frequency (Times/Week): 4, Weekly Exercise (Minutes/Week): 40, Intensity: Mild, Exercise limited by: orthopedic condition(s);respiratory conditions(s)  Goals   None    Depression Screen PHQ 2/9 Scores 03/22/2020 02/24/2020  PHQ - 2 Score 0 0    Fall Risk Fall Risk  03/22/2020 02/24/2020 08/19/2019  Falls in the past year? 1 0 0  Comment - - Emmi Telephone Survey: data to providers prior to load  Number falls in past yr: 0 0 -  Injury with Fall? 0 0 -    Any stairs in or around the home? Yes  If so, are there any without handrails? No  Home free of loose throw rugs in walkways, pet beds, electrical cords, etc? Yes  Adequate  lighting in your home to reduce risk of falls? Yes   ASSISTIVE DEVICES UTILIZED TO PREVENT FALLS:  Life alert? No  Use of a cane, walker or w/c? No  Grab bars in the bathroom? No  Shower chair or bench in shower? No  Elevated toilet seat or a handicapped toilet? No   Gait slow and steady without use of assistive device  Cognitive Function:     6CIT Screen 03/22/2020  What Year? 0 points  What month? 0 points  What time? 0 points  Count back from 20 0 points  Months in reverse 2 points  Repeat phrase 2 points  Total Score 4    Immunizations Immunization History  Administered Date(s) Administered  . Pneumococcal Conjugate-13 12/06/2014  . Pneumococcal Polysaccharide-23 07/23/1998  . Tdap 07/30/2012    TDAP status: Up to date Flu Vaccine status: Up to date Pneumococcal vaccine status: Completed during today's visit. Covid-19 vaccine status: Completed vaccines  Qualifies for Shingles Vaccine? Yes   Zostavax completed Yes   Shingrix Completed?: Yes  Screening Tests Health Maintenance  Topic Date Due  . COVID-19 Vaccine (1) Never done  . PNA vac Low Risk Adult (2 of 2 - PPSV23) 12/06/2015  . INFLUENZA VACCINE  04/24/2020  . TETANUS/TDAP  07/30/2022  . DEXA SCAN  Completed    Health Maintenance  Health Maintenance Due  Topic Date Due  . COVID-19 Vaccine (1) Never done  . PNA vac Low Risk Adult (2 of 2 - PPSV23) 12/06/2015    Colorectal cancer screening: Completed 08/24/2016. Repeat every 10 years Mammogram status: Ordered, complete yearly Bone Density status: Ordered, complete every two years  Lung Cancer Screening: (Low Dose CT Chest recommended if Age 88-80 years, 30 pack-year currently smoking OR have quit w/in 15years.) does not qualify.   Additional Screening:  Vision Screening: Recommended annual ophthalmology exams for early detection of glaucoma and other disorders of the eye. Is the patient up to date with their annual eye exam?  Yes   Dental  Screening: Recommended annual dental exams for proper oral hygiene    Plan:  Counseling was provided today regarding the following topics: healthy eating habits, home safety, vitamin and mineral supplementation (calcium and Vit D), regular exercise, breast self-exam, tobacco avoidance, limitation of alcohol intake, use of seat belts, firearm safety, and fall prevention.  Annual recommendations include: influenza vaccine, dental cleanings, and eye exams.  I have ordered a Mammogram and DEXA scan  I have personally reviewed and noted the following in the patient's chart:   . Medical and social history . Use of alcohol, tobacco or illicit drugs  . Current medications and supplements . Functional ability and status . Nutritional status . Physical activity . Advanced directives . List of other physicians . Hospitalizations, surgeries, and ER visits in previous 12 months . Vitals . Screenings to include cognitive, depression, and falls . Referrals and appointments  In addition, I have reviewed and discussed with patient certain preventive protocols, quality metrics, and best practice recommendations. A written personalized care plan for preventive services as well as general preventive health recommendations were provided to patient.     Erie Noe, LPN   [id number]/35/[id number]

## 2020-04-05 ENCOUNTER — Encounter: Payer: Self-pay | Admitting: Family Medicine

## 2020-04-13 ENCOUNTER — Encounter: Payer: Self-pay | Admitting: Family Medicine

## 2020-04-13 DIAGNOSIS — Z1231 Encounter for screening mammogram for malignant neoplasm of breast: Secondary | ICD-10-CM | POA: Diagnosis not present

## 2020-04-13 DIAGNOSIS — M810 Age-related osteoporosis without current pathological fracture: Secondary | ICD-10-CM | POA: Diagnosis not present

## 2020-04-13 DIAGNOSIS — M8588 Other specified disorders of bone density and structure, other site: Secondary | ICD-10-CM | POA: Diagnosis not present

## 2020-04-21 ENCOUNTER — Telehealth: Payer: Self-pay

## 2020-04-21 NOTE — Telephone Encounter (Signed)
Patient informed of DEXA results states she was told by her kidney doctor to d/c taking calcium and she is unsure if she wants to start Prolia injections and will contact our office. She does not want to start Zetia due to trying and failing it in the past. She currently is being seen by GI and is unable to take ASA 81 mg. Also informed her of normal mammogram results.

## 2020-06-01 DIAGNOSIS — Z23 Encounter for immunization: Secondary | ICD-10-CM | POA: Diagnosis not present

## 2020-06-02 ENCOUNTER — Encounter: Payer: Self-pay | Admitting: Legal Medicine

## 2020-06-02 ENCOUNTER — Other Ambulatory Visit: Payer: Self-pay

## 2020-06-02 ENCOUNTER — Ambulatory Visit (INDEPENDENT_AMBULATORY_CARE_PROVIDER_SITE_OTHER): Payer: Medicare Other | Admitting: Legal Medicine

## 2020-06-02 VITALS — BP 120/60 | HR 70 | Temp 97.6°F | Resp 16 | Ht <= 58 in | Wt 90.2 lb

## 2020-06-02 DIAGNOSIS — L2081 Atopic neurodermatitis: Secondary | ICD-10-CM | POA: Diagnosis not present

## 2020-06-02 DIAGNOSIS — L209 Atopic dermatitis, unspecified: Secondary | ICD-10-CM | POA: Insufficient documentation

## 2020-06-02 MED ORDER — TRIAMCINOLONE ACETONIDE 40 MG/ML IJ SUSP
80.0000 mg | Freq: Once | INTRAMUSCULAR | Status: AC
  Administered 2020-06-02: 80 mg via INTRAMUSCULAR

## 2020-06-02 MED ORDER — TRIAMCINOLONE ACETONIDE 0.1 % EX CREA: 1.0000 "application " | TOPICAL_CREAM | Freq: Two times a day (BID) | CUTANEOUS | 2 refills | Status: DC

## 2020-06-02 NOTE — Progress Notes (Signed)
Subjective:  Patient ID: Valerie Kelley, female    DOB: [birth year]  Age: 86 y.o. MRN: [id number]  Chief Complaint  Patient presents with  . Rash    Since 3 days ago    HPI diffuse rash on trunk for 3 days with papules that she excoriated.  The rash is on trunk only.  The skin is very dry. She showers every day.  No rash on hands or feet.  She double rinses her clothes.  She is using aveno .   Current Outpatient Medications on File Prior to Visit  Medication Sig Dispense Refill  . acetaminophen (TYLENOL) 325 MG tablet Take 325 mg by mouth every evening. For pain    . albuterol (PROVENTIL HFA;VENTOLIN HFA) 108 (90 BASE) MCG/ACT inhaler Inhale 2 puffs into the lungs every 6 (six) hours as needed for wheezing. For wheezing    . b complex vitamins capsule Take by mouth.    . budesonide-formoterol (SYMBICORT) 160-4.5 MCG/ACT inhaler Inhale into the lungs.    . cholecalciferol (VITAMIN D) 1000 UNITS tablet Take 1,000 Units by mouth daily.    . cimetidine (TAGAMET) 300 MG tablet Take 300 mg by mouth daily.    Marland Kitchen co-enzyme Q-10 30 MG capsule Take 30 mg by mouth daily.    . DULoxetine (CYMBALTA) 20 MG capsule TAKE 1 CAPSULE BY MOUTH TWICE A DAY 180 capsule 1  . fish oil-omega-3 fatty acids 1000 MG capsule Take 2 g by mouth daily.    Marland Kitchen gabapentin (NEURONTIN) 100 MG capsule Take by mouth.    . loratadine (CLARITIN) 10 MG tablet Take 10 mg by mouth daily.    . polyethylene glycol (MIRALAX / GLYCOLAX) packet Take 51 g by mouth daily. Takes 3 capfuls daily    . tiotropium (SPIRIVA) 18 MCG inhalation capsule Place 18 mcg into inhaler and inhale daily.    . traZODone (DESYREL) 50 MG tablet Take 50 mg by mouth at bedtime.    . TURMERIC PO Take by mouth.    . vitamin E 400 UNIT capsule Take 800 Units by mouth daily.     No current facility-administered medications on file prior to visit.   Past Medical History:  Diagnosis Date  . Arthritis    "all over" (04/15/2013)  . Asthma   . Emphysema     "mild" (04/15/2013)  . Exertional shortness of breath   . Gastric paresis   . GERD (gastroesophageal reflux disease)   . H/O hiatal hernia   . High cholesterol   . Hypothyroidism   . Stage III chronic kidney disease    Past Surgical History:  Procedure Laterality Date  . ABDOMINAL HYSTERECTOMY    . REVERSE SHOULDER ARTHROPLASTY Left 04/15/2013  . REVERSE SHOULDER ARTHROPLASTY Left 04/15/2013   Procedure: REVERSE SHOULDER ARTHROPLASTY LEFT;  Surgeon: Augustin Schooling, MD;  Location: [facility];  Service: Orthopedics;  Laterality: Left;  . SINUS EXPLORATION    . TONSILLECTOMY      Family History  Problem Relation Age of Onset  . Stroke Mother   . Cancer Brother        brain  . Cancer Brother        leukemia  . Cancer Brother        lung   Social History   Socioeconomic History  . Marital status: Widowed    Spouse name: Not on file  . Number of children: 2  . Years of education: Not on file  . Highest education level: Not  on file  Occupational History  . Occupation: Retired  Tobacco Use  . Smoking status: Never Smoker  . Smokeless tobacco: Never Used  Substance and Sexual Activity  . Alcohol use: No  . Drug use: No  . Sexual activity: Not Currently  Other Topics Concern  . Not on file  Social History Narrative  . Not on file   Social Determinants of Health   Financial Resource Strain:   . Difficulty of Paying Living Expenses: Not on file  Food Insecurity:   . Worried About Charity fundraiser in the Last Year: Not on file  . Ran Out of Food in the Last Year: Not on file  Transportation Needs:   . Lack of Transportation (Medical): Not on file  . Lack of Transportation (Non-Medical): Not on file  Physical Activity:   . Days of Exercise per Week: Not on file  . Minutes of Exercise per Session: Not on file  Stress:   . Feeling of Stress : Not on file  Social Connections:   . Frequency of Communication with Friends and Family: Not on file  . Frequency of Social  Gatherings with Friends and Family: Not on file  . Attends Religious Services: Not on file  . Active Member of Clubs or Organizations: Not on file  . Attends Archivist Meetings: Not on file  . Marital Status: Not on file    Review of Systems  Constitutional: Negative.   HENT: Negative.   Eyes: Negative.   Respiratory: Negative.   Cardiovascular: Negative.   Gastrointestinal: Negative.   Endocrine: Negative.   Genitourinary: Negative.   Musculoskeletal: Negative.   Skin: Positive for rash.  Neurological: Negative.   Psychiatric/Behavioral: Negative.      Objective:  BP 120/60   Pulse 70   Temp 97.6 F (36.4 C)   Resp 16   Ht 4\' 10"  (1.473 m)   Wt 90 lb 3.2 oz (40.9 kg)   BMI 18.85 kg/m   BP/Weight 06/02/2020 5/63/8756 12/25/3293  Systolic BP 188 416 606  Diastolic BP 60 68 54  Wt. (Lbs) 90.2 83.5 86  BMI 18.85 17.45 17.97    Physical Exam Vitals reviewed.  Constitutional:      Appearance: Normal appearance.  HENT:     Right Ear: Tympanic membrane normal.     Left Ear: Tympanic membrane normal.     Nose: Nose normal.  Cardiovascular:     Rate and Rhythm: Normal rate and regular rhythm.     Pulses: Normal pulses.     Heart sounds: Normal heart sounds.  Pulmonary:     Effort: Pulmonary effort is normal.     Breath sounds: Normal breath sounds.  Musculoskeletal:        General: Normal range of motion.  Skin:    General: Skin is warm.     Capillary Refill: Capillary refill takes less than 2 seconds.     Comments: Excoriated area with papules on abdomen and on back with very dry skin  Neurological:     General: No focal deficit present.     Mental Status: She is alert. Mental status is at baseline.       Lab Results  Component Value Date   WBC 5.5 02/24/2020   HGB 12.8 02/24/2020   HCT 38.8 02/24/2020   PLT 234 02/24/2020   GLUCOSE 75 03/10/2020   CHOL 291 (H) 02/24/2020   TRIG 134 02/24/2020   HDL 69 02/24/2020   LDLCALC 198 (H)  02/24/2020   ALT 13 03/10/2020   AST 22 03/10/2020   NA 140 03/10/2020   K 4.0 03/10/2020   CL 101 03/10/2020   CREATININE 1.50 (H) 03/10/2020   BUN 15 03/10/2020   CO2 21 03/10/2020   TSH 4.170 02/24/2020      Assessment & Plan:   1. Atopic neurodermatitis - triamcinolone acetonide (KENALOG-40) injection 80 mg - triamcinolone cream (KENALOG) 0.1 %; Apply 1 application topically 2 (two) times daily.  Dispense: 30 g; Refill: 2  I recommended Aveeno bid day, shower every other day and keep skin moisturized.  Kenalog given and some cream as necessary.  She has used this in past without problems  Meds ordered this encounter  Medications  . triamcinolone acetonide (KENALOG-40) injection 80 mg  . triamcinolone cream (KENALOG) 0.1 %    Sig: Apply 1 application topically 2 (two) times daily.    Dispense:  30 g    Refill:  2    No orders of the defined types were placed in this encounter.    Follow-up: No follow-ups on file.  An After Visit Summary was printed and given to the patient.  [place] [phone]

## 2020-06-04 DIAGNOSIS — L662 Folliculitis decalvans: Secondary | ICD-10-CM | POA: Diagnosis not present

## 2020-06-06 ENCOUNTER — Ambulatory Visit (INDEPENDENT_AMBULATORY_CARE_PROVIDER_SITE_OTHER): Payer: Medicare Other | Admitting: Family Medicine

## 2020-06-06 ENCOUNTER — Encounter: Payer: Self-pay | Admitting: Family Medicine

## 2020-06-06 ENCOUNTER — Other Ambulatory Visit: Payer: Self-pay

## 2020-06-06 VITALS — BP 192/70 | HR 62 | Temp 97.2°F | Ht <= 58 in | Wt 97.0 lb

## 2020-06-06 DIAGNOSIS — L309 Dermatitis, unspecified: Secondary | ICD-10-CM

## 2020-06-06 MED ORDER — HYDROXYZINE PAMOATE 25 MG PO CAPS: 25.0000 mg | ORAL_CAPSULE | Freq: Three times a day (TID) | ORAL | 0 refills | Status: DC | PRN

## 2020-06-06 NOTE — Progress Notes (Signed)
Acute Office Visit  Subjective:    Patient ID: Valerie Kelley, female    DOB: August 07, 1934, 86 y.o.   MRN: [id number]  Chief Complaint  Patient presents with  . Rash    All over abdomen    HPI Patient is in today for rash noted after watering plants for her daughter-concern for chiggers-pt used after bite-stick roll on, and finger nail polish, cortisone 10 topically, Calamine lotion. Pt seen 9/9 -Dr. Beverlee Nims cream and Kenalog shot-worsening -more red-now solid in places. Pt seen at Urgent Care 9/11-Valtrex-not taken, Doxycycline(2 days) and Lyrica-only took one on Sat-took, Benadryl taken on Friday and Sunday. Burning noted all the time, Tail bone area itching. Itching and Burning at the same time. Pt using Lidocaine to help with itching and burning.  Pt took Glucosamine/Chondrotin-? -caused rash. Pt takes shellfish free-Glucosamine. All three spots started after watering plants.  -pt felt chiggers caused irritation  Past Medical History:  Diagnosis Date  . Arthritis    "all over" (04/15/2013)  . Asthma   . Emphysema    "mild" (04/15/2013)  . Exertional shortness of breath   . Gastric paresis   . GERD (gastroesophageal reflux disease)   . H/O hiatal hernia   . High cholesterol   . Hypothyroidism   . Stage III chronic kidney disease     Past Surgical History:  Procedure Laterality Date  . ABDOMINAL HYSTERECTOMY    . REVERSE SHOULDER ARTHROPLASTY Left 04/15/2013  . REVERSE SHOULDER ARTHROPLASTY Left 04/15/2013   Procedure: REVERSE SHOULDER ARTHROPLASTY LEFT;  Surgeon: Augustin Schooling, MD;  Location: [place];  Service: Orthopedics;  Laterality: Left;  . SINUS EXPLORATION    . TONSILLECTOMY      Family History  Problem Relation Age of Onset  . Stroke Mother   . Cancer Brother        brain  . Cancer Brother        leukemia  . Cancer Brother        lung    Social History   Socioeconomic History  . Marital status: Widowed    Spouse name: Not on file  . Number  of children: 2  . Years of education: Not on file  . Highest education level: Not on file  Occupational History  . Occupation: Retired  Tobacco Use  . Smoking status: Never Smoker  . Smokeless tobacco: Never Used  Substance and Sexual Activity  . Alcohol use: No  . Drug use: No  . Sexual activity: Not Currently  Other Topics Concern  . Not on file  Social History Narrative  . Not on file   Social Determinants of Health   Financial Resource Strain:   . Difficulty of Paying Living Expenses: Not on file  Food Insecurity:   . Worried About Charity fundraiser in the Last Year: Not on file  . Ran Out of Food in the Last Year: Not on file  Transportation Needs:   . Lack of Transportation (Medical): Not on file  . Lack of Transportation (Non-Medical): Not on file  Physical Activity:   . Days of Exercise per Week: Not on file  . Minutes of Exercise per Session: Not on file  Stress:   . Feeling of Stress : Not on file  Social Connections:   . Frequency of Communication with Friends and Family: Not on file  . Frequency of Social Gatherings with Friends and Family: Not on file  . Attends Religious Services: Not on file  .  Active Member of Clubs or Organizations: Not on file  . Attends Archivist Meetings: Not on file  . Marital Status: Not on file  Intimate Partner Violence:   . Fear of Current or Ex-Partner: Not on file  . Emotionally Abused: Not on file  . Physically Abused: Not on file  . Sexually Abused: Not on file    Outpatient Medications Prior to Visit  Medication Sig Dispense Refill  . acetaminophen (TYLENOL) 325 MG tablet Take 325 mg by mouth every evening. For pain    . albuterol (PROVENTIL HFA;VENTOLIN HFA) 108 (90 BASE) MCG/ACT inhaler Inhale 2 puffs into the lungs every 6 (six) hours as needed for wheezing. For wheezing    . b complex vitamins capsule Take by mouth.    . budesonide-formoterol (SYMBICORT) 160-4.5 MCG/ACT inhaler Inhale into the lungs.      . cholecalciferol (VITAMIN D) 1000 UNITS tablet Take 1,000 Units by mouth daily.    . cimetidine (TAGAMET) 300 MG tablet Take 300 mg by mouth daily.    Marland Kitchen co-enzyme Q-10 30 MG capsule Take 30 mg by mouth daily.    Marland Kitchen doxycycline (VIBRAMYCIN) 100 MG capsule Take 100 mg by mouth 2 (two) times daily.    . DULoxetine (CYMBALTA) 20 MG capsule TAKE 1 CAPSULE BY MOUTH TWICE A DAY 180 capsule 1  . fish oil-omega-3 fatty acids 1000 MG capsule Take 2 g by mouth daily.    . fluconazole (DIFLUCAN) 150 MG tablet Take 150 mg by mouth once.    . gabapentin (NEURONTIN) 100 MG capsule Take by mouth.    . loratadine (CLARITIN) 10 MG tablet Take 10 mg by mouth daily.    . polyethylene glycol (MIRALAX / GLYCOLAX) packet Take 51 g by mouth daily. Takes 3 capfuls daily    . pregabalin (LYRICA) 50 MG capsule Take 50 mg by mouth 3 (three) times daily.    Marland Kitchen tiotropium (SPIRIVA) 18 MCG inhalation capsule Place 18 mcg into inhaler and inhale daily.    . traZODone (DESYREL) 50 MG tablet Take 50 mg by mouth at bedtime.    . triamcinolone cream (KENALOG) 0.1 % Apply 1 application topically 2 (two) times daily. 30 g 2  . TURMERIC PO Take by mouth.    . valACYclovir (VALTREX) 1000 MG tablet Take 1,000 mg by mouth 3 (three) times daily.    . vitamin E 400 UNIT capsule Take 800 Units by mouth daily.     No facility-administered medications prior to visit.    Allergies  Allergen Reactions  . Codeine Nausea And Vomiting and Nausea Only  . Darvocet [Propoxyphene N-Acetaminophen] Nausea And Vomiting  . Other Hives and Nausea And Vomiting  . Prednisone Swelling and Other (See Comments)    Face swells and constant headache And headache  . Pregabalin Other (See Comments)    Makes her feel drunk.  . Shellfish Allergy Hives  . Penicillins Rash    Review of Systems  Constitutional: Negative for fever.  Respiratory: Negative for shortness of breath.   Skin: Positive for rash.       Objective:    Physical  Exam Pulmonary:     Effort: Pulmonary effort is normal.  Skin:    Findings: Rash present.       Neurological:     Mental Status: She is alert.     BP (!) 192/70 (BP Location: Left Arm, Patient Position: Sitting)   Pulse 62   Temp (!) 97.2 F (36.2 C) (Temporal)  Ht 4\' 10"  (1.473 m)   Wt 97 lb (44 kg)   SpO2 96%   BMI 20.27 kg/m  Wt Readings from Last 3 Encounters:  06/06/20 97 lb (44 kg)  06/02/20 90 lb 3.2 oz (40.9 kg)  03/22/20 83 lb 8 oz (37.9 kg)    Health Maintenance Due  Topic Date Due  . COVID-19 Vaccine (1) Never done  . INFLUENZA VACCINE  04/24/2020     Lab Results  Component Value Date   TSH 4.170 02/24/2020   Lab Results  Component Value Date   WBC 5.5 02/24/2020   HGB 12.8 02/24/2020   HCT 38.8 02/24/2020   MCV 95 02/24/2020   PLT 234 02/24/2020   Lab Results  Component Value Date   NA 140 03/10/2020   K 4.0 03/10/2020   CO2 21 03/10/2020   GLUCOSE 75 03/10/2020   BUN 15 03/10/2020   CREATININE 1.50 (H) 03/10/2020   BILITOT 0.4 03/10/2020   ALKPHOS 86 03/10/2020   AST 22 03/10/2020   ALT 13 03/10/2020   PROT 7.4 03/10/2020   ALBUMIN 4.8 (H) 03/10/2020   CALCIUM 10.0 03/10/2020   Lab Results  Component Value Date   CHOL 291 (H) 02/24/2020   Lab Results  Component Value Date   HDL 69 02/24/2020   Lab Results  Component Value Date   LDLCALC 198 (H) 02/24/2020   Lab Results  Component Value Date   TRIG 134 02/24/2020   Lab Results  Component Value Date   CHOLHDL 4.2 02/24/2020       Assessment & Plan:  1. Dermatitis Avoid steroids-allergy by history. Not shingles-avoid valtrex. Trial of Vistaril-risk/benefit d/w pt. Continue doxy-concerns for cellulitis around site of the nail polish-itching.   Valerie Kelley Hannah Beat, MD

## 2020-06-06 NOTE — Patient Instructions (Addendum)
Avoid steroids on the skin Use caladryl lotion -follow direction on the bottle Use vistaril 3 times daily-be careful as it can cause drowsiness Use doxy as directed-antibiotic to reduce risk of infection-always take with food If short of breath, rash worsens  Or other concerns-ER to monitor use of steroids as pt states allergy Stop valtrex-this is not shingles Stay as cool as possible

## 2020-06-11 DIAGNOSIS — L309 Dermatitis, unspecified: Secondary | ICD-10-CM | POA: Insufficient documentation

## 2020-07-06 DIAGNOSIS — S51812A Laceration without foreign body of left forearm, initial encounter: Secondary | ICD-10-CM | POA: Diagnosis not present

## 2020-07-21 DIAGNOSIS — I129 Hypertensive chronic kidney disease with stage 1 through stage 4 chronic kidney disease, or unspecified chronic kidney disease: Secondary | ICD-10-CM | POA: Diagnosis not present

## 2020-07-21 DIAGNOSIS — R002 Palpitations: Secondary | ICD-10-CM | POA: Insufficient documentation

## 2020-07-21 DIAGNOSIS — N183 Chronic kidney disease, stage 3 unspecified: Secondary | ICD-10-CM | POA: Diagnosis not present

## 2020-07-21 DIAGNOSIS — E782 Mixed hyperlipidemia: Secondary | ICD-10-CM | POA: Diagnosis not present

## 2020-07-21 HISTORY — DX: Palpitations: R00.2

## 2020-08-08 DIAGNOSIS — I129 Hypertensive chronic kidney disease with stage 1 through stage 4 chronic kidney disease, or unspecified chronic kidney disease: Secondary | ICD-10-CM | POA: Diagnosis not present

## 2020-08-08 DIAGNOSIS — E782 Mixed hyperlipidemia: Secondary | ICD-10-CM | POA: Diagnosis not present

## 2020-08-08 DIAGNOSIS — R002 Palpitations: Secondary | ICD-10-CM | POA: Diagnosis not present

## 2020-08-08 DIAGNOSIS — N183 Chronic kidney disease, stage 3 unspecified: Secondary | ICD-10-CM | POA: Diagnosis not present

## 2020-08-11 DIAGNOSIS — R9439 Abnormal result of other cardiovascular function study: Secondary | ICD-10-CM | POA: Diagnosis not present

## 2020-08-11 DIAGNOSIS — I491 Atrial premature depolarization: Secondary | ICD-10-CM | POA: Diagnosis not present

## 2020-08-24 DIAGNOSIS — I129 Hypertensive chronic kidney disease with stage 1 through stage 4 chronic kidney disease, or unspecified chronic kidney disease: Secondary | ICD-10-CM

## 2020-08-24 DIAGNOSIS — J4540 Moderate persistent asthma, uncomplicated: Secondary | ICD-10-CM | POA: Insufficient documentation

## 2020-08-24 DIAGNOSIS — M810 Age-related osteoporosis without current pathological fracture: Secondary | ICD-10-CM

## 2020-08-24 HISTORY — DX: Hypertensive chronic kidney disease with stage 1 through stage 4 chronic kidney disease, or unspecified chronic kidney disease: I12.9

## 2020-08-24 HISTORY — DX: Age-related osteoporosis without current pathological fracture: M81.0

## 2020-08-24 HISTORY — DX: Moderate persistent asthma, uncomplicated: J45.40

## 2020-08-24 NOTE — Progress Notes (Signed)
Subjective:  Patient ID: Valerie Kelley, female    DOB: 1934-03-21  Age: 86 y.o. MRN: [id number]  Chief Complaint  Patient presents with  . Hyperlipidemia  . Hypothyroidism    HPI Moderate persistent asthma- well controlled on symbicort, spiriva, and ventolin. Denies dyspnea. Pt sees pulmonology, Dr. Alcide Clever.     Hyperlipidemia- treatment includes fish oil and a low cholesterol/low fat diet.  Last labs done in June 2021 Short poorly controlled cholesterol.  LDL 198.  At the time I recommended Crestor which patient refused. Previously tried lipitor, welchol, zocor and caused muscle pain.     Dx with age-related osteoporosis worsening in July 2021.  I recommend Prolia every 6 months at that time as well as calcium citrate 1200 mg daily.  Chronic kidney disease stage IV: Pt scheduled for follow up appt 08/2020.Marland Kitchen Patient sees Dr. Olivia Mackie, nephrology.  Patient was last seen in June 2021.  No changes that time were made.  This is felt to be due to chronic hypertension as well as chronic NSAID use for her osteoarthritis.  November 2021: Echocardiogram normal.  Cardiac heart monitor showed very mild abnormalities pvcs.  Patient saw Dr. Beatrix Fetters. Pt scheduled to see Dr. Atilano Median.   Patient saw gynecology in July 2021 for menopausal symptoms which included hot flashes and night sweats.  Patient's Neurontin was increased 100 mg up to 3 capsules nightly.  Pt took only one but did not like how it made her feel. She was taking gabapentin for nerves in her hands. She is also considering changing Cymbalta to Paxil.  Dr. Dema Severin did not recommend starting hormone replacement.  Weight loss and constipation: Patient has been seen by GI, Blanca Friend, PA-C.  Last in June 2021. Eats all the time. Drinking protein shake once daily. Protein powder over oatmeal.  No dysphagia.   Current Outpatient Medications on File Prior to Visit  Medication Sig Dispense Refill  . acetaminophen (TYLENOL) 325 MG tablet Take 325 mg  by mouth every evening. For pain    . albuterol (PROVENTIL HFA;VENTOLIN HFA) 108 (90 BASE) MCG/ACT inhaler Inhale 2 puffs into the lungs every 6 (six) hours as needed for wheezing. For wheezing    . b complex vitamins capsule Take by mouth.    . budesonide-formoterol (SYMBICORT) 160-4.5 MCG/ACT inhaler Inhale into the lungs.    . cholecalciferol (VITAMIN D) 1000 UNITS tablet Take 1,000 Units by mouth daily.    . cimetidine (TAGAMET) 300 MG tablet Take 300 mg by mouth daily.    Marland Kitchen co-enzyme Q-10 30 MG capsule Take 30 mg by mouth daily.    . DULoxetine (CYMBALTA) 20 MG capsule TAKE 1 CAPSULE BY MOUTH TWICE A DAY 180 capsule 1  . fish oil-omega-3 fatty acids 1000 MG capsule Take 2 g by mouth daily.    Marland Kitchen gabapentin (NEURONTIN) 100 MG capsule Take by mouth.    . hydrOXYzine (VISTARIL) 25 MG capsule Take 1 capsule (25 mg total) by mouth 3 (three) times daily as needed. 30 capsule 0  . loratadine (CLARITIN) 10 MG tablet Take 10 mg by mouth daily.    . polyethylene glycol (MIRALAX / GLYCOLAX) packet Take 51 g by mouth daily. Takes 3 capfuls daily    . pregabalin (LYRICA) 50 MG capsule Take 50 mg by mouth 3 (three) times daily.    Marland Kitchen tiotropium (SPIRIVA) 18 MCG inhalation capsule Place 18 mcg into inhaler and inhale daily.    . traZODone (DESYREL) 50 MG tablet Take 50 mg by  mouth at bedtime.    . triamcinolone cream (KENALOG) 0.1 % Apply 1 application topically 2 (two) times daily. 30 g 2  . TURMERIC PO Take by mouth.    . valACYclovir (VALTREX) 1000 MG tablet Take 1,000 mg by mouth 3 (three) times daily.    . vitamin E 400 UNIT capsule Take 800 Units by mouth daily.     No current facility-administered medications on file prior to visit.   Past Medical History:  Diagnosis Date  . Arthritis    "all over" (04/15/2013)  . Asthma   . Emphysema    "mild" (04/15/2013)  . Exertional shortness of breath   . Gastric paresis   . GERD (gastroesophageal reflux disease)   . H/O hiatal hernia   . High  cholesterol   . Hypothyroidism   . Stage III chronic kidney disease    Past Surgical History:  Procedure Laterality Date  . ABDOMINAL HYSTERECTOMY    . REVERSE SHOULDER ARTHROPLASTY Left 04/15/2013  . REVERSE SHOULDER ARTHROPLASTY Left 04/15/2013   Procedure: REVERSE SHOULDER ARTHROPLASTY LEFT;  Surgeon: Augustin Schooling, MD;  Location: [place];  Service: Orthopedics;  Laterality: Left;  . SINUS EXPLORATION    . TONSILLECTOMY      Family History  Problem Relation Age of Onset  . Stroke Mother   . Cancer Brother        brain  . Cancer Brother        leukemia  . Cancer Brother        lung   Social History   Socioeconomic History  . Marital status: Widowed    Spouse name: Not on file  . Number of children: 2  . Years of education: Not on file  . Highest education level: Not on file  Occupational History  . Occupation: Retired  Tobacco Use  . Smoking status: Never Smoker  . Smokeless tobacco: Never Used  Substance and Sexual Activity  . Alcohol use: No  . Drug use: No  . Sexual activity: Not Currently  Other Topics Concern  . Not on file  Social History Narrative  . Not on file   Social Determinants of Health   Financial Resource Strain:   . Difficulty of Paying Living Expenses: Not on file  Food Insecurity:   . Worried About Charity fundraiser in the Last Year: Not on file  . Ran Out of Food in the Last Year: Not on file  Transportation Needs:   . Lack of Transportation (Medical): Not on file  . Lack of Transportation (Non-Medical): Not on file  Physical Activity:   . Days of Exercise per Week: Not on file  . Minutes of Exercise per Session: Not on file  Stress:   . Feeling of Stress : Not on file  Social Connections:   . Frequency of Communication with Friends and Family: Not on file  . Frequency of Social Gatherings with Friends and Family: Not on file  . Attends Religious Services: Not on file  . Active Member of Clubs or Organizations: Not on file  .  Attends Archivist Meetings: Not on file  . Marital Status: Not on file    Review of Systems  Constitutional: Positive for fatigue and unexpected weight change. Negative for chills and fever.  HENT: Positive for congestion. Negative for ear pain, rhinorrhea and sore throat.   Respiratory: Positive for shortness of breath. Negative for cough.   Cardiovascular: Negative for chest pain.  Gastrointestinal: Positive for  constipation (takes miralax.) and nausea. Negative for abdominal pain, diarrhea and vomiting.  Endocrine: Positive for polyphagia. Negative for polydipsia and polyuria.  Genitourinary: Negative for dysuria, hematuria and urgency.  Musculoskeletal: Positive for arthralgias and back pain. Negative for myalgias.  Neurological: Positive for weakness. Negative for dizziness, light-headedness and headaches.  Psychiatric/Behavioral: Negative for dysphoric mood. The patient is not nervous/anxious.      Objective:  There were no vitals taken for this visit.  BP/Weight 06/06/2020 06/02/2020 2/94/7654  Systolic BP 650 354 656  Diastolic BP 70 60 68  Wt. (Lbs) 97 90.2 83.5  BMI 20.27 18.85 17.45    Physical Exam Vitals reviewed.  Constitutional:      Appearance: Normal appearance.  Neck:     Vascular: No carotid bruit.  Cardiovascular:     Rate and Rhythm: Normal rate and regular rhythm.     Pulses: Normal pulses.  Pulmonary:     Effort: Pulmonary effort is normal.     Breath sounds: Normal breath sounds.  Abdominal:     General: Bowel sounds are normal.     Tenderness: There is no abdominal tenderness.  Neurological:     Mental Status: She is alert and oriented to person, place, and time.  Psychiatric:        Mood and Affect: Mood normal.        Behavior: Behavior normal.     Diabetic Foot Exam - Simple   No data filed       Lab Results  Component Value Date   WBC 5.5 02/24/2020   HGB 12.8 02/24/2020   HCT 38.8 02/24/2020   PLT 234 02/24/2020    GLUCOSE 75 03/10/2020   CHOL 291 (H) 02/24/2020   TRIG 134 02/24/2020   HDL 69 02/24/2020   LDLCALC 198 (H) 02/24/2020   ALT 13 03/10/2020   AST 22 03/10/2020   NA 140 03/10/2020   K 4.0 03/10/2020   CL 101 03/10/2020   CREATININE 1.50 (H) 03/10/2020   BUN 15 03/10/2020   CO2 21 03/10/2020   TSH 4.170 02/24/2020      Assessment & Plan:  1. Mixed hyperlipidemia Poorly controlled. Recheck today.  Discussion concerning need for statin. - CBC with Differential/Platelet - Comprehensive metabolic panel - Lipid panel  2. Acquired hypothyroidism - TSH  3. Gastroparesis Management per specialist.   4. Chronic idiopathic constipation Management per specialist.   5. Palpitations Recent holter monitor minimally abnormal.  Management per specialist.  Dr. Beatrix Fetters. Keep appt with Dr. Atilano Median.  6. Hypertensive kidney disease with stage 4 chronic kidney disease ([place]) Poorly controlled. Worsening.  - CMP  7. Osteoporosis, post-menopausal Recommend calcium citrate with D.  Recommend prolia  8. Moderate persistent asthma without complication The current medical regimen is effective;  continue present plan and medications.  9. Malabsorption syndrome due to gastric dumping syndrome. - B12, folate, mma  10. Paresthesia and pain of both upper extremities/Lower extremities Intolerant to gabapentin, lyrica, and higher dose of cymbalta.  - B12 and Folate Panel - Methylmalonic acid, serum  11. Severe protein-calorie malnutrition (HCC) DDX: Malabsorption/cancer. - POCT URINALYSIS DIP (CLINITEK) - DG Lumbar Spine Complete - DG Chest 2 View - B12 and Folate Panel - Methylmalonic acid, serum  12. 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 Per pt cardiology is to discuss new medicine...likely repatha. I encouraged pt to take this medicine.   I spent 40 minutes dedicated to the care of this patient on the date of this encounter to  include face-to-face time with the patient, as well as:  Reviewing numerous specialists notes and discussing need for statins and treatments for osteoporosis.   Follow-up: Return in about 3 months (around 11/23/2020) for fasting.  An After Visit Summary was printed and given to the patient.  [name], MD [facility] [phone]

## 2020-08-25 ENCOUNTER — Ambulatory Visit (INDEPENDENT_AMBULATORY_CARE_PROVIDER_SITE_OTHER): Payer: Medicare Other | Admitting: Family Medicine

## 2020-08-25 ENCOUNTER — Other Ambulatory Visit: Payer: Self-pay

## 2020-08-25 VITALS — BP 120/60 | HR 72 | Temp 97.1°F | Resp 14 | Ht <= 58 in | Wt 83.6 lb

## 2020-08-25 DIAGNOSIS — K5904 Chronic idiopathic constipation: Secondary | ICD-10-CM

## 2020-08-25 DIAGNOSIS — E782 Mixed hyperlipidemia: Secondary | ICD-10-CM | POA: Diagnosis not present

## 2020-08-25 DIAGNOSIS — E039 Hypothyroidism, unspecified: Secondary | ICD-10-CM

## 2020-08-25 DIAGNOSIS — E7849 Other hyperlipidemia: Secondary | ICD-10-CM | POA: Diagnosis not present

## 2020-08-25 DIAGNOSIS — G720 Drug-induced myopathy: Secondary | ICD-10-CM | POA: Diagnosis not present

## 2020-08-25 DIAGNOSIS — K3184 Gastroparesis: Secondary | ICD-10-CM | POA: Diagnosis not present

## 2020-08-25 DIAGNOSIS — M79601 Pain in right arm: Secondary | ICD-10-CM | POA: Diagnosis not present

## 2020-08-25 DIAGNOSIS — M810 Age-related osteoporosis without current pathological fracture: Secondary | ICD-10-CM | POA: Diagnosis not present

## 2020-08-25 DIAGNOSIS — M79602 Pain in left arm: Secondary | ICD-10-CM | POA: Diagnosis not present

## 2020-08-25 DIAGNOSIS — N184 Chronic kidney disease, stage 4 (severe): Secondary | ICD-10-CM

## 2020-08-25 DIAGNOSIS — R202 Paresthesia of skin: Secondary | ICD-10-CM | POA: Diagnosis not present

## 2020-08-25 DIAGNOSIS — J4540 Moderate persistent asthma, uncomplicated: Secondary | ICD-10-CM | POA: Diagnosis not present

## 2020-08-25 DIAGNOSIS — E43 Unspecified severe protein-calorie malnutrition: Secondary | ICD-10-CM

## 2020-08-25 DIAGNOSIS — R002 Palpitations: Secondary | ICD-10-CM

## 2020-08-25 DIAGNOSIS — I129 Hypertensive chronic kidney disease with stage 1 through stage 4 chronic kidney disease, or unspecified chronic kidney disease: Secondary | ICD-10-CM

## 2020-08-25 DIAGNOSIS — R5383 Other fatigue: Secondary | ICD-10-CM | POA: Diagnosis not present

## 2020-08-25 DIAGNOSIS — K909 Intestinal malabsorption, unspecified: Secondary | ICD-10-CM | POA: Diagnosis not present

## 2020-08-25 DIAGNOSIS — R0602 Shortness of breath: Secondary | ICD-10-CM | POA: Diagnosis not present

## 2020-08-25 LAB — POCT URINALYSIS DIP (CLINITEK)
Bilirubin, UA: NEGATIVE
Blood, UA: NEGATIVE
Glucose, UA: NEGATIVE mg/dL
Ketones, POC UA: NEGATIVE mg/dL
Leukocytes, UA: NEGATIVE
Nitrite, UA: NEGATIVE
POC PROTEIN,UA: NEGATIVE
Spec Grav, UA: 1.015 (ref 1.010–1.025)
Urobilinogen, UA: NEGATIVE E.U./dL — AB
pH, UA: 5.5 (ref 5.0–8.0)

## 2020-08-25 NOTE — Patient Instructions (Addendum)
Recommend go to pharmacy for Shingrix vaccinations.  Order xray of lumbar spine, chest xray.  Labs today.  Consider ct scan of abdomen and pelvis without contrast due to weight loss.    High Cholesterol  High cholesterol is a condition in which the blood has high levels of a white, waxy, fat-like substance (cholesterol). The human body needs small amounts of cholesterol. The liver makes all the cholesterol that the body needs. Extra (excess) cholesterol comes from the food that we eat. Cholesterol is carried from the liver by the blood through the blood vessels. If you have high cholesterol, deposits (plaques) may build up on the walls of your blood vessels (arteries). Plaques make the arteries narrower and stiffer. Cholesterol plaques increase your risk for heart attack and stroke. Work with your health care provider to keep your cholesterol levels in a healthy range. What increases the risk? This condition is more likely to develop in people who:  Eat foods that are high in animal fat (saturated fat) or cholesterol.  Are overweight.  Are not getting enough exercise.  Have a family history of high cholesterol. What are the signs or symptoms? There are no symptoms of this condition. How is this diagnosed? This condition may be diagnosed from the results of a blood test.  If you are older than age 107, your health care provider may check your cholesterol every 4-6 years.  You may be checked more often if you already have high cholesterol or other risk factors for heart disease. The blood test for cholesterol measures:  "Bad" cholesterol (LDL cholesterol). This is the main type of cholesterol that causes heart disease. The desired level for LDL is less than 100.  "Good" cholesterol (HDL cholesterol). This type helps to protect against heart disease by cleaning the arteries and carrying the LDL away. The desired level for HDL is 60 or higher.  Triglycerides. These are fats that the body  can store or burn for energy. The desired number for triglycerides is lower than 150.  Total cholesterol. This is a measure of the total amount of cholesterol in your blood, including LDL cholesterol, HDL cholesterol, and triglycerides. A healthy number is less than 200. How is this treated? This condition is treated with diet changes, lifestyle changes, and medicines. Diet changes  This may include eating more whole grains, fruits, vegetables, nuts, and fish.  This may also include cutting back on red meat and foods that have a lot of added sugar. Lifestyle changes  Changes may include getting at least 40 minutes of aerobic exercise 3 times a week. Aerobic exercises include walking, biking, and swimming. Aerobic exercise along with a healthy diet can help you maintain a healthy weight.  Changes may also include quitting smoking. Medicines  Medicines are usually given if diet and lifestyle changes have failed to reduce your cholesterol to healthy levels.  Your health care provider may prescribe a statin medicine. Statin medicines have been shown to reduce cholesterol, which can reduce the risk of heart disease. Follow these instructions at home: Eating and drinking If told by your health care provider:  Eat chicken (without skin), fish, veal, shellfish, ground [place] breast, and round or loin cuts of red meat.  Do not eat fried foods or fatty meats, such as hot dogs and salami.  Eat plenty of fruits, such as apples.  Eat plenty of vegetables, such as broccoli, potatoes, and carrots.  Eat beans, peas, and lentils.  Eat grains such as barley, rice, couscous, and  bulgur wheat.  Eat pasta without cream sauces.  Use skim or nonfat milk, and eat low-fat or nonfat yogurt and cheeses.  Do not eat or drink whole milk, cream, ice cream, egg yolks, or hard cheeses.  Do not eat stick margarine or tub margarines that contain trans fats (also called partially hydrogenated oils).  Do not  eat saturated tropical oils, such as coconut oil and palm oil.  Do not eat cakes, cookies, crackers, or other baked goods that contain trans fats.  General instructions  Exercise as directed by your health care provider. Increase your activity level with activities such as gardening, walking, and taking the stairs.  Take over-the-counter and prescription medicines only as told by your health care provider.  Do not use any products that contain nicotine or tobacco, such as cigarettes and e-cigarettes. If you need help quitting, ask your health care provider.  Keep all follow-up visits as told by your health care provider. This is important. Contact a health care provider if:  You are struggling to maintain a healthy diet or weight.  You need help to start on an exercise program.  You need help to stop smoking. Get help right away if:  You have chest pain.  You have trouble breathing. This information is not intended to replace advice given to you by your health care provider. Make sure you discuss any questions you have with your health care provider. Document Revised: 09/13/2017 Document Reviewed: 03/10/2016 Elsevier Patient Education  [place].  Rosuvastatin capsules What is this medicine? ROSUVASTATIN (roe SOO va sta tin) is known as an HMG-CoA reductase inhibitor or 'statin'. It lowers cholesterol and triglycerides in the blood. Diet and lifestyle changes are often used with this drug. This medicine may be used for other purposes; ask your health care provider or pharmacist if you have questions. COMMON BRAND NAME(S): Ezallor What should I tell my health care provider before I take this medicine? They need to know if you have any of these conditions:  diabetes  if you often drink alcohol  history of stroke  kidney disease  liver disease  muscle aches or weakness  thyroid disease  an unusual or allergic reaction to rosuvastatin, other medicines, foods,  dyes, or preservatives  pregnant or trying to get pregnant  breast-feeding How should I use this medicine? Take this medicine by mouth with a glass of water. Follow the directions on the prescription label. Swallow the capsules whole. Do not crush or chew this medicine. You may open the capsule and put the contents in 1 teaspoon of applesauce, chocolate pudding, or vanilla pudding. Swallow the medicine with applesauce or pudding within 60 minutes (1 hour). Do not chew the medicine, applesauce, or pudding. You can take this medicine with or without food. Take your doses at regular intervals. Do not take your medicine more often than directed. Talk to your pediatrician about the use of this medicine in children. Special care may be needed. Overdosage: If you think you have taken too much of this medicine contact a poison control center or emergency room at once. NOTE: This medicine is only for you. Do not share this medicine with others. What if I miss a dose? If you miss a dose, take it as soon as you can. If your next dose is to be taken in less than 12 hours, then do not take the missed dose. Take the next dose at your regular time. Do not take double or extra doses. What may  interact with this medicine? Do not take this medicine with any of the following medications:  herbal medicines like red yeast rice This medicine may also interact with the following medications:  alcohol  antacids containing aluminum hydroxide or magnesium hydroxide  cyclosporine  other medicines for high cholesterol  some medicines for HIV infection  warfarin This list may not describe all possible interactions. Give your health care provider a list of all the medicines, herbs, non-prescription drugs, or dietary supplements you use. Also tell them if you smoke, drink alcohol, or use illegal drugs. Some items may interact with your medicine. What should I watch for while using this medicine? Visit your doctor or  health care professional for regular check-ups. You may need regular tests to make sure your liver is working properly. Your health care professional may tell you to stop taking this medicine if you develop muscle problems. If your muscle problems do not go away after stopping this medicine, contact your health care professional. Do not become pregnant while taking this medicine. Women should inform their health care professional if they wish to become pregnant or think they might be pregnant. There is a potential for serious side effects to an unborn child. Talk to your health care professional or pharmacist for more information. Do not breast-feed an infant while taking this medicine. This medicine may increase blood sugar. Ask your healthcare provider if changes in diet or medicines are needed if you have diabetes. If you are going to need surgery or other procedure, tell your doctor that you are using this medicine. This drug is only part of a total heart-health program. Your doctor or a dietician can suggest a low-cholesterol and low-fat diet to help. Avoid alcohol and smoking, and keep a proper exercise schedule. This medicine may cause a decrease in Co-Enzyme Q-10. You should make sure that you get enough Co-Enzyme Q-10 while you are taking this medicine. Discuss the foods you eat and the vitamins you take with your health care professional. What side effects may I notice from receiving this medicine? Side effects that you should report to your doctor or health care professional as soon as possible:  allergic reactions like skin rash, itching or hives, swelling of the face, lips, or tongue  dark urine  fever  joint pain  muscle cramps, pain  redness, blistering, peeling or loosening of the skin, including inside the mouth  signs and symptoms of high blood sugar such as being more thirsty or hungry or having to urinate more than normal. You may also feel very tired or have blurry  vision.  trouble passing urine or change in the amount of urine  unusually weak  yellowing of the eyes or skin Side effects that usually do not require medical attention (report these to your doctor or health care professional if they continue or are bothersome):  constipation  heartburn  nausea  stomach gas, pain, upset This list may not describe all possible side effects. Call your doctor for medical advice about side effects. You may report side effects to FDA at 1-800-FDA-1088. Where should I keep my medicine? Keep out of the reach of children. Store at room temperature between 20 and 25 degrees C (68 and 77 degrees F). Keep container tightly closed (protect from moisture). Throw away any unused medicine after the expiration date. NOTE: This sheet is a summary. It may not cover all possible information. If you have questions about this medicine, talk to your doctor, pharmacist, or health care  provider.  2020 Elsevier/Gold Standard (2019-01-13 10:34:28)   Osteoporosis  Osteoporosis happens when your bones get thin and weak. This can cause your bones to break (fracture) more easily. You can do things at home to make your bones stronger. Follow these instructions at home:  Activity  Exercise as told by your doctor. Ask your doctor what activities are safe for you. You should do: ? Exercises that make your muscles work to hold your body weight up (weight-bearing exercises). These include tai chi, yoga, and walking. ? Exercises to make your muscles stronger. One example is lifting weights. Lifestyle  Limit alcohol intake to no more than 1 drink a day for nonpregnant women and 2 drinks a day for men. One drink equals 12 oz of beer, 5 oz of wine, or 1 oz of hard liquor.  Do not use any products that have nicotine or tobacco in them. These include cigarettes and e-cigarettes. If you need help quitting, ask your doctor. Preventing falls  Use tools to help you move around  (mobility aids) as needed. These include canes, walkers, scooters, and crutches.  Keep rooms well-lit and free of clutter.  Put away things that could make you trip. These include cords and rugs.  Install safety rails on stairs. Install grab bars in bathrooms.  Use rubber mats in slippery areas, like bathrooms.  Wear shoes that: ? Fit you well. ? Support your feet. ? Have closed toes. ? Have rubber soles or low heels.  Tell your doctor about all of the medicines you are taking. Some medicines can make you more likely to fall. General instructions  Eat plenty of calcium and vitamin D. These nutrients are good for your bones. Good sources of calcium and vitamin D include: ? Some fatty fish, such as salmon and tuna. ? Foods that have calcium and vitamin D added to them (fortified foods). For example, some breakfast cereals are fortified with calcium and vitamin D. ? Egg yolks. ? Cheese. ? Liver.  Take over-the-counter and prescription medicines only as told by your doctor.  Keep all follow-up visits as told by your doctor. This is important. Contact a doctor if:  You have not been tested (screened) for osteoporosis and you are: ? A woman who is age 56 or older. ? A man who is age 23 or older. Get help right away if:  You fall.  You get hurt. Summary  Osteoporosis happens when your bones get thin and weak.  Weak bones can break (fracture) more easily.  Eat plenty of calcium and vitamin D. These nutrients are good for your bones.  Tell your doctor about all of the medicines that you take. This information is not intended to replace advice given to you by your health care provider. Make sure you discuss any questions you have with your health care provider. Document Revised: 08/23/2017 Document Reviewed: 07/05/2017 Elsevier Patient Education  [place].  Denosumab injection What is this medicine? DENOSUMAB (den oh sue mab) slows bone breakdown. Prolia is used  to treat osteoporosis in women after menopause and in men, and in people who are taking corticosteroids for 6 months or more. Delton See is used to treat a high calcium level due to cancer and to prevent bone fractures and other bone problems caused by multiple myeloma or cancer bone metastases. Delton See is also used to treat giant cell tumor of the bone. This medicine may be used for other purposes; ask your health care provider or pharmacist if you have  questions. COMMON BRAND NAME(S): Prolia, XGEVA What should I tell my health care provider before I take this medicine? They need to know if you have any of these conditions:  dental disease  having surgery or tooth extraction  infection  kidney disease  low levels of calcium or Vitamin D in the blood  malnutrition  on hemodialysis  skin conditions or sensitivity  thyroid or parathyroid disease  an unusual reaction to denosumab, other medicines, foods, dyes, or preservatives  pregnant or trying to get pregnant  breast-feeding How should I use this medicine? This medicine is for injection under the skin. It is given by a health care professional in a hospital or clinic setting. A special MedGuide will be given to you before each treatment. Be sure to read this information carefully each time. For Prolia, talk to your pediatrician regarding the use of this medicine in children. Special care may be needed. For Delton See, talk to your pediatrician regarding the use of this medicine in children. While this drug may be prescribed for children as young as 13 years for selected conditions, precautions do apply. Overdosage: If you think you have taken too much of this medicine contact a poison control center or emergency room at once. NOTE: This medicine is only for you. Do not share this medicine with others. What if I miss a dose? It is important not to miss your dose. Call your doctor or health care professional if you are unable to keep an  appointment. What may interact with this medicine? Do not take this medicine with any of the following medications:  other medicines containing denosumab This medicine may also interact with the following medications:  medicines that lower your chance of fighting infection  steroid medicines like prednisone or cortisone This list may not describe all possible interactions. Give your health care provider a list of all the medicines, herbs, non-prescription drugs, or dietary supplements you use. Also tell them if you smoke, drink alcohol, or use illegal drugs. Some items may interact with your medicine. What should I watch for while using this medicine? Visit your doctor or health care professional for regular checks on your progress. Your doctor or health care professional may order blood tests and other tests to see how you are doing. Call your doctor or health care professional for advice if you get a fever, chills or sore throat, or other symptoms of a cold or flu. Do not treat yourself. This drug may decrease your body's ability to fight infection. Try to avoid being around people who are sick. You should make sure you get enough calcium and vitamin D while you are taking this medicine, unless your doctor tells you not to. Discuss the foods you eat and the vitamins you take with your health care professional. See your dentist regularly. Brush and floss your teeth as directed. Before you have any dental work done, tell your dentist you are receiving this medicine. Do not become pregnant while taking this medicine or for 5 months after stopping it. Talk with your doctor or health care professional about your birth control options while taking this medicine. Women should inform their doctor if they wish to become pregnant or think they might be pregnant. There is a potential for serious side effects to an unborn child. Talk to your health care professional or pharmacist for more information. What  side effects may I notice from receiving this medicine? Side effects that you should report to your doctor or health care  professional as soon as possible:  allergic reactions like skin rash, itching or hives, swelling of the face, lips, or tongue  bone pain  breathing problems  dizziness  jaw pain, especially after dental work  redness, blistering, peeling of the skin  signs and symptoms of infection like fever or chills; cough; sore throat; pain or trouble passing urine  signs of low calcium like fast heartbeat, muscle cramps or muscle pain; pain, tingling, numbness in the hands or feet; seizures  unusual bleeding or bruising  unusually weak or tired Side effects that usually do not require medical attention (report to your doctor or health care professional if they continue or are bothersome):  constipation  diarrhea  headache  joint pain  loss of appetite  muscle pain  runny nose  tiredness  upset stomach This list may not describe all possible side effects. Call your doctor for medical advice about side effects. You may report side effects to FDA at 1-800-FDA-1088. Where should I keep my medicine? This medicine is only given in a clinic, doctor's office, or other health care setting and will not be stored at home. NOTE: This sheet is a summary. It may not cover all possible information. If you have questions about this medicine, talk to your doctor, pharmacist, or health care provider.  2020 Elsevier/Gold Standard (2018-01-17 16:10:44)

## 2020-08-29 ENCOUNTER — Telehealth: Payer: Self-pay

## 2020-08-29 DIAGNOSIS — K3189 Other diseases of stomach and duodenum: Secondary | ICD-10-CM | POA: Diagnosis not present

## 2020-08-29 DIAGNOSIS — N189 Chronic kidney disease, unspecified: Secondary | ICD-10-CM | POA: Diagnosis not present

## 2020-08-29 DIAGNOSIS — R634 Abnormal weight loss: Secondary | ICD-10-CM | POA: Diagnosis not present

## 2020-08-29 NOTE — Telephone Encounter (Signed)
Valerie Kelley called to request an appointment for LS spine films and she also said that she is now ready to have CT abdomen, pelvis and chest w/o contrast scheduled.

## 2020-08-30 NOTE — Telephone Encounter (Signed)
Lumbar xray order. Please print out and give to patient.  Order ct scan of abdomen/pelvis/chest: for weight loss.  Kc

## 2020-09-01 LAB — CBC WITH DIFFERENTIAL/PLATELET
Basophils Absolute: 0 10*3/uL (ref 0.0–0.2)
Basos: 0 %
EOS (ABSOLUTE): 0.1 10*3/uL (ref 0.0–0.4)
Eos: 1 %
Hematocrit: 41.1 % (ref 34.0–46.6)
Hemoglobin: 13.8 g/dL (ref 11.1–15.9)
Immature Grans (Abs): 0 10*3/uL (ref 0.0–0.1)
Immature Granulocytes: 0 %
Lymphocytes Absolute: 2.6 10*3/uL (ref 0.7–3.1)
Lymphs: 25 %
MCH: 31.3 pg (ref 26.6–33.0)
MCHC: 33.6 g/dL (ref 31.5–35.7)
MCV: 93 fL (ref 79–97)
Monocytes Absolute: 1.1 10*3/uL — ABNORMAL HIGH (ref 0.1–0.9)
Monocytes: 11 %
Neutrophils Absolute: 6.9 10*3/uL (ref 1.4–7.0)
Neutrophils: 63 %
Platelets: 297 10*3/uL (ref 150–450)
RBC: 4.41 x10E6/uL (ref 3.77–5.28)
RDW: 13.3 % (ref 11.7–15.4)
WBC: 10.8 10*3/uL (ref 3.4–10.8)

## 2020-09-01 LAB — METHYLMALONIC ACID, SERUM: Methylmalonic Acid: 238 nmol/L (ref 0–378)

## 2020-09-01 LAB — COMPREHENSIVE METABOLIC PANEL
ALT: 22 IU/L (ref 0–32)
AST: 30 IU/L (ref 0–40)
Albumin/Globulin Ratio: 1.7 (ref 1.2–2.2)
Albumin: 4.9 g/dL — ABNORMAL HIGH (ref 3.6–4.6)
Alkaline Phosphatase: 90 IU/L (ref 44–121)
BUN/Creatinine Ratio: 10 — ABNORMAL LOW (ref 12–28)
BUN: 17 mg/dL (ref 8–27)
Bilirubin Total: 0.6 mg/dL (ref 0.0–1.2)
CO2: 18 mmol/L — ABNORMAL LOW (ref 20–29)
Calcium: 10 mg/dL (ref 8.7–10.3)
Chloride: 102 mmol/L (ref 96–106)
Creatinine, Ser: 1.69 mg/dL — ABNORMAL HIGH (ref 0.57–1.00)
GFR calc Af Amer: 32 mL/min/{1.73_m2} — ABNORMAL LOW (ref >59)
GFR calc non Af Amer: 27 mL/min/{1.73_m2} — ABNORMAL LOW (ref >59)
Globulin, Total: 2.9 g/dL (ref 1.5–4.5)
Glucose: 96 mg/dL (ref 65–99)
Potassium: 3.9 mmol/L (ref 3.5–5.2)
Sodium: 141 mmol/L (ref 134–144)
Total Protein: 7.8 g/dL (ref 6.0–8.5)

## 2020-09-01 LAB — CARDIOVASCULAR RISK ASSESSMENT

## 2020-09-01 LAB — B12 AND FOLATE PANEL
Folate: >20 ng/mL (ref >3.0)
Vitamin B-12: 1901 pg/mL — ABNORMAL HIGH (ref 232–1245)

## 2020-09-01 LAB — LIPID PANEL
Chol/HDL Ratio: 4 ratio (ref 0.0–4.4)
Cholesterol, Total: 329 mg/dL — ABNORMAL HIGH (ref 100–199)
HDL: 82 mg/dL (ref >39)
LDL Chol Calc (NIH): 224 mg/dL — ABNORMAL HIGH (ref 0–99)
Triglycerides: 129 mg/dL (ref 0–149)
VLDL Cholesterol Cal: 23 mg/dL (ref 5–40)

## 2020-09-01 LAB — TSH: TSH: 5.16 u[IU]/mL — ABNORMAL HIGH (ref 0.450–4.500)

## 2020-09-01 LAB — VITAMIN D 25 HYDROXY (VIT D DEFICIENCY, FRACTURES): Vit D, 25-Hydroxy: 55 ng/mL (ref 30.0–100.0)

## 2020-09-05 ENCOUNTER — Telehealth: Payer: Self-pay

## 2020-09-05 ENCOUNTER — Other Ambulatory Visit: Payer: Self-pay

## 2020-09-05 DIAGNOSIS — E039 Hypothyroidism, unspecified: Secondary | ICD-10-CM

## 2020-09-05 MED ORDER — LEVOTHYROXINE SODIUM 25 MCG PO TABS
25.0000 ug | ORAL_TABLET | Freq: Every day | ORAL | 1 refills | Status: DC
Start: 2020-09-05 — End: 2020-09-27

## 2020-09-05 NOTE — Telephone Encounter (Signed)
Valerie Kelley called back to let us know that she is willing to try the Synthroid.  She will take the medication for 6 weeks and she will recheck blood work in the office.  RX sent to [facility] in [place].

## 2020-09-05 NOTE — Telephone Encounter (Signed)
Patient left a voicemail regarding her thyroid medication, attempted to return call however she did not answer and was not able to leave a message.

## 2020-09-13 DIAGNOSIS — N183 Chronic kidney disease, stage 3 unspecified: Secondary | ICD-10-CM | POA: Diagnosis not present

## 2020-09-13 DIAGNOSIS — I129 Hypertensive chronic kidney disease with stage 1 through stage 4 chronic kidney disease, or unspecified chronic kidney disease: Secondary | ICD-10-CM | POA: Diagnosis not present

## 2020-09-13 DIAGNOSIS — R7989 Other specified abnormal findings of blood chemistry: Secondary | ICD-10-CM | POA: Diagnosis not present

## 2020-09-13 DIAGNOSIS — E559 Vitamin D deficiency, unspecified: Secondary | ICD-10-CM | POA: Diagnosis not present

## 2020-09-27 ENCOUNTER — Other Ambulatory Visit: Payer: Self-pay | Admitting: Family Medicine

## 2020-10-06 ENCOUNTER — Ambulatory Visit (INDEPENDENT_AMBULATORY_CARE_PROVIDER_SITE_OTHER): Payer: Medicare Other | Admitting: Family Medicine

## 2020-10-06 ENCOUNTER — Other Ambulatory Visit: Payer: Self-pay

## 2020-10-06 VITALS — BP 110/60 | HR 80 | Temp 97.3°F | Resp 16 | Ht <= 58 in | Wt 88.0 lb

## 2020-10-06 DIAGNOSIS — N184 Chronic kidney disease, stage 4 (severe): Secondary | ICD-10-CM | POA: Diagnosis not present

## 2020-10-06 DIAGNOSIS — E039 Hypothyroidism, unspecified: Secondary | ICD-10-CM | POA: Diagnosis not present

## 2020-10-06 DIAGNOSIS — K5900 Constipation, unspecified: Secondary | ICD-10-CM | POA: Diagnosis not present

## 2020-10-06 DIAGNOSIS — N1832 Chronic kidney disease, stage 3b: Secondary | ICD-10-CM

## 2020-10-06 DIAGNOSIS — E7849 Other hyperlipidemia: Secondary | ICD-10-CM | POA: Diagnosis not present

## 2020-10-06 NOTE — Progress Notes (Signed)
Subjective:  Patient ID: Valerie Kelley, female    DOB: 03-Jun-1934  Age: 87 y.o. MRN: [id number]  Chief Complaint  Patient presents with  . Hyperlipidemia  . Hypothyroidism    HPI Hypothyroidism: Started synthroid 25 mcg once daily in am after last visit. Started having a headache after starting synthroid. Then for  2 weeks after that had constipation, which required more medicines. Bone pain has improved. She increased duloxetine to  20 mg twice a day on her own about 4 days and this seemed to help. She is here for recheck of TSH.   Familial hyperlipidemia: her last LDL was 224. Statins cause muscle pain. Recommended zetia at last visit, but she refused.   Current Outpatient Medications on File Prior to Visit  Medication Sig Dispense Refill  . acetaminophen (TYLENOL) 325 MG tablet Take 325 mg by mouth every evening. For pain    . albuterol (PROVENTIL HFA;VENTOLIN HFA) 108 (90 BASE) MCG/ACT inhaler Inhale 2 puffs into the lungs every 6 (six) hours as needed for wheezing. For wheezing    . b complex vitamins capsule Take by mouth.    . budesonide-formoterol (SYMBICORT) 160-4.5 MCG/ACT inhaler Inhale into the lungs.    . cholecalciferol (VITAMIN D) 1000 UNITS tablet Take 1,000 Units by mouth daily.    . cimetidine (TAGAMET) 300 MG tablet Take 300 mg by mouth daily.    Marland Kitchen co-enzyme Q-10 30 MG capsule Take 30 mg by mouth daily.    . DULoxetine (CYMBALTA) 20 MG capsule TAKE 1 CAPSULE BY MOUTH TWICE A DAY 180 capsule 1  . fish oil-omega-3 fatty acids 1000 MG capsule Take 2 g by mouth daily.    Marland Kitchen levothyroxine (SYNTHROID) 25 MCG tablet TAKE 1 TABLET BY MOUTH DAILY BEFORE BREAKFAST. 30 tablet 3  . loratadine (CLARITIN) 10 MG tablet Take 10 mg by mouth daily.    . polyethylene glycol (MIRALAX / GLYCOLAX) packet Take 51 g by mouth daily. Takes 3 capfuls daily    . tiotropium (SPIRIVA) 18 MCG inhalation capsule Place 18 mcg into inhaler and inhale daily.    . traZODone (DESYREL) 50 MG tablet Take  50 mg by mouth at bedtime.    . TURMERIC PO Take by mouth.    . vitamin E 400 UNIT capsule Take 800 Units by mouth daily.     No current facility-administered medications on file prior to visit.   Past Medical History:  Diagnosis Date  . Arthritis    "all over" (04/15/2013)  . Asthma   . Emphysema    "mild" (04/15/2013)  . Exertional shortness of breath   . Gastric paresis   . GERD (gastroesophageal reflux disease)   . H/O hiatal hernia   . High cholesterol   . Hypothyroidism   . Stage III chronic kidney disease    Past Surgical History:  Procedure Laterality Date  . ABDOMINAL HYSTERECTOMY    . REVERSE SHOULDER ARTHROPLASTY Left 04/15/2013  . REVERSE SHOULDER ARTHROPLASTY Left 04/15/2013   Procedure: REVERSE SHOULDER ARTHROPLASTY LEFT;  Surgeon: Augustin Schooling, MD;  Location: [facility];  Service: Orthopedics;  Laterality: Left;  . SINUS EXPLORATION    . TONSILLECTOMY      Family History  Problem Relation Age of Onset  . Stroke Mother   . Cancer Brother        brain  . Cancer Brother        leukemia  . Cancer Brother        lung  Social History   Socioeconomic History  . Marital status: Widowed    Spouse name: Not on file  . Number of children: 2  . Years of education: Not on file  . Highest education level: Not on file  Occupational History  . Occupation: Retired  Tobacco Use  . Smoking status: Never Smoker  . Smokeless tobacco: Never Used  Substance and Sexual Activity  . Alcohol use: No  . Drug use: No  . Sexual activity: Not Currently  Other Topics Concern  . Not on file  Social History Narrative  . Not on file   Social Determinants of Health   Financial Resource Strain: Not on file  Food Insecurity: Not on file  Transportation Needs: Not on file  Physical Activity: Not on file  Stress: Not on file  Social Connections: Not on file    Review of Systems  Constitutional: Negative for chills, fatigue and fever.  HENT: Negative for congestion, ear  pain, rhinorrhea and sore throat.   Respiratory: Negative for cough and shortness of breath.   Cardiovascular: Negative for chest pain and palpitations.  Gastrointestinal: Positive for constipation. Negative for abdominal pain, diarrhea, nausea and vomiting.  Genitourinary: Negative for dysuria and urgency.  Musculoskeletal: Positive for back pain (neck pain) and myalgias (knee pain ).  Neurological: Positive for weakness and headaches. Negative for dizziness and light-headedness.  Psychiatric/Behavioral: Negative for dysphoric mood. The patient is not nervous/anxious.      Objective:  BP 110/60   Pulse 80   Temp (!) 97.3 F (36.3 C)   Resp 16   Ht 4\' 10"  (1.473 m)   Wt 88 lb (39.9 kg)   BMI 18.39 kg/m   BP/Weight 10/06/2020 08/25/2020 3/81/8299  Systolic BP 371 696 789  Diastolic BP 60 60 70  Wt. (Lbs) 88 83.6 97  BMI 18.39 17.47 20.27    Physical Exam  Diabetic Foot Exam - Simple   No data filed      Lab Results  Component Value Date   WBC 10.8 08/25/2020   HGB 13.8 08/25/2020   HCT 41.1 08/25/2020   PLT 297 08/25/2020   GLUCOSE 86 10/06/2020   CHOL 329 (H) 08/25/2020   TRIG 129 08/25/2020   HDL 82 08/25/2020   LDLCALC 224 (H) 08/25/2020   ALT 17 10/06/2020   AST 24 10/06/2020   NA 138 10/06/2020   K 4.3 10/06/2020   CL 101 10/06/2020   CREATININE 1.22 (H) 10/06/2020   BUN 13 10/06/2020   CO2 24 10/06/2020   TSH 2.830 10/06/2020      Assessment & Plan:   1. Acquired hypothyroidism Continue synthroid 25 mcg once daily as labs came back therapeutic - TSH - T4, Free  2. Stage 3b chronic kidney disease ([place]) Improved slightly. - Comprehensive metabolic panel  3. Familial hyperlipidemia Discuss repatha and nexlizet at next visit when we recheck cholesterol.  4. Constipation, unspecified constipation type  resolved.   No orders of the defined types were placed in this encounter.   Orders Placed This Encounter  Procedures  . TSH  . T4, Free   . Comprehensive metabolic panel     Follow-up: Return in about 7 weeks (around 11/24/2020) for fasting.  An After Visit Summary was printed and given to the patient.  [name], MD [facility] [phone]

## 2020-10-07 LAB — COMPREHENSIVE METABOLIC PANEL
ALT: 17 IU/L (ref 0–32)
AST: 24 IU/L (ref 0–40)
Albumin/Globulin Ratio: 1.8 (ref 1.2–2.2)
Albumin: 4.6 g/dL (ref 3.6–4.6)
Alkaline Phosphatase: 75 IU/L (ref 44–121)
BUN/Creatinine Ratio: 11 — ABNORMAL LOW (ref 12–28)
BUN: 13 mg/dL (ref 8–27)
Bilirubin Total: 0.3 mg/dL (ref 0.0–1.2)
CO2: 24 mmol/L (ref 20–29)
Calcium: 9.4 mg/dL (ref 8.7–10.3)
Chloride: 101 mmol/L (ref 96–106)
Creatinine, Ser: 1.22 mg/dL — ABNORMAL HIGH (ref 0.57–1.00)
GFR calc Af Amer: 47 mL/min/{1.73_m2} — ABNORMAL LOW (ref >59)
GFR calc non Af Amer: 41 mL/min/{1.73_m2} — ABNORMAL LOW (ref >59)
Globulin, Total: 2.5 g/dL (ref 1.5–4.5)
Glucose: 86 mg/dL (ref 65–99)
Potassium: 4.3 mmol/L (ref 3.5–5.2)
Sodium: 138 mmol/L (ref 134–144)
Total Protein: 7.1 g/dL (ref 6.0–8.5)

## 2020-10-07 LAB — TSH: TSH: 2.83 u[IU]/mL (ref 0.450–4.500)

## 2020-10-07 LAB — T4, FREE: Free T4: 1.42 ng/dL (ref 0.82–1.77)

## 2020-10-10 ENCOUNTER — Encounter: Payer: Self-pay | Admitting: Family Medicine

## 2020-10-17 ENCOUNTER — Other Ambulatory Visit: Payer: Medicare Other

## 2020-11-15 DIAGNOSIS — H61001 Unspecified perichondritis of right external ear: Secondary | ICD-10-CM | POA: Diagnosis not present

## 2020-11-15 DIAGNOSIS — L570 Actinic keratosis: Secondary | ICD-10-CM | POA: Diagnosis not present

## 2020-11-18 DIAGNOSIS — I129 Hypertensive chronic kidney disease with stage 1 through stage 4 chronic kidney disease, or unspecified chronic kidney disease: Secondary | ICD-10-CM | POA: Diagnosis not present

## 2020-11-18 DIAGNOSIS — N183 Chronic kidney disease, stage 3 unspecified: Secondary | ICD-10-CM | POA: Diagnosis not present

## 2020-11-21 DIAGNOSIS — I129 Hypertensive chronic kidney disease with stage 1 through stage 4 chronic kidney disease, or unspecified chronic kidney disease: Secondary | ICD-10-CM | POA: Diagnosis not present

## 2020-11-21 DIAGNOSIS — N183 Chronic kidney disease, stage 3 unspecified: Secondary | ICD-10-CM | POA: Diagnosis not present

## 2020-11-21 DIAGNOSIS — N1832 Chronic kidney disease, stage 3b: Secondary | ICD-10-CM | POA: Diagnosis not present

## 2020-11-21 DIAGNOSIS — E559 Vitamin D deficiency, unspecified: Secondary | ICD-10-CM | POA: Diagnosis not present

## 2020-11-25 NOTE — Progress Notes (Signed)
Subjective:  Patient ID: Valerie Kelley, female    DOB: 1934-08-24  Age: 87 y.o. MRN: [id number]  Chief Complaint  Patient presents with  . Hyperlipidemia  . Hypertension    HPI Acquired hypothyroidism D/C taking synthroid - came off of it 2/14. Started on 12/4. Did not feel well. Improved after discontinuation. Seems to raise her bp and her sugars. Made her bones hurt.   Mixed hyperlipidemia Taking omega 3. Low fat diet.   Gastroparesis: Dumping syndrome.  Asthma: Told by previous pcp that she had copd, but Dr. Alcide Clever told her it is only asthma and she does not have to take her inhaler. Symbicort 2 puffs twice a day prn and spiriva 18 mcg prn and albuterol.   Insomnia: trazodone helps.   Current Outpatient Medications on File Prior to Visit  Medication Sig Dispense Refill  . acetaminophen (TYLENOL) 325 MG tablet Take 325 mg by mouth every evening. For pain    . albuterol (PROVENTIL HFA;VENTOLIN HFA) 108 (90 BASE) MCG/ACT inhaler Inhale 2 puffs into the lungs every 6 (six) hours as needed for wheezing. For wheezing    . b complex vitamins capsule Take by mouth.    . budesonide-formoterol (SYMBICORT) 160-4.5 MCG/ACT inhaler Inhale into the lungs.    . cholecalciferol (VITAMIN D) 1000 UNITS tablet Take 1,000 Units by mouth daily.    . cimetidine (TAGAMET) 300 MG tablet Take 300 mg by mouth daily.    Marland Kitchen co-enzyme Q-10 30 MG capsule Take 30 mg by mouth daily.    . DULoxetine (CYMBALTA) 20 MG capsule TAKE 1 CAPSULE BY MOUTH TWICE A DAY 180 capsule 1  . fish oil-omega-3 fatty acids 1000 MG capsule Take 2 g by mouth daily.    Marland Kitchen loratadine (CLARITIN) 10 MG tablet Take 10 mg by mouth daily.    . polyethylene glycol (MIRALAX / GLYCOLAX) packet Take 51 g by mouth daily. Takes 3 capfuls daily    . tiotropium (SPIRIVA) 18 MCG inhalation capsule Place 18 mcg into inhaler and inhale daily.    . traZODone (DESYREL) 50 MG tablet Take 50 mg by mouth at bedtime.    . TURMERIC PO Take by mouth.     . vitamin E 400 UNIT capsule Take 800 Units by mouth daily.     No current facility-administered medications on file prior to visit.   Past Medical History:  Diagnosis Date  . Arthritis    "all over" (04/15/2013)  . Asthma   . Emphysema    "mild" (04/15/2013)  . Exertional shortness of breath   . Gastric paresis   . GERD (gastroesophageal reflux disease)   . H/O hiatal hernia   . High cholesterol   . Hypothyroidism   . Stage III chronic kidney disease [facility])    Past Surgical History:  Procedure Laterality Date  . ABDOMINAL HYSTERECTOMY    . REVERSE SHOULDER ARTHROPLASTY Left 04/15/2013  . REVERSE SHOULDER ARTHROPLASTY Left 04/15/2013   Procedure: REVERSE SHOULDER ARTHROPLASTY LEFT;  Surgeon: Augustin Schooling, MD;  Location: [facility];  Service: Orthopedics;  Laterality: Left;  . SINUS EXPLORATION    . TONSILLECTOMY      Family History  Problem Relation Age of Onset  . Stroke Mother   . Cancer Brother        brain  . Cancer Brother        leukemia  . Cancer Brother        lung   Social History   Socioeconomic History  .  Marital status: Widowed    Spouse name: Not on file  . Number of children: 2  . Years of education: Not on file  . Highest education level: Not on file  Occupational History  . Occupation: Retired  Tobacco Use  . Smoking status: Never Smoker  . Smokeless tobacco: Never Used  Substance and Sexual Activity  . Alcohol use: No  . Drug use: No  . Sexual activity: Not Currently  Other Topics Concern  . Not on file  Social History Narrative  . Not on file   Social Determinants of Health   Financial Resource Strain: Not on file  Food Insecurity: Not on file  Transportation Needs: Not on file  Physical Activity: Not on file  Stress: Not on file  Social Connections: Not on file    Review of Systems  Constitutional: Positive for fatigue. Negative for chills and fever.  HENT: Positive for congestion and ear pain. Negative for rhinorrhea and sore  throat.   Respiratory: Positive for shortness of breath. Negative for cough.   Cardiovascular: Negative for chest pain.  Gastrointestinal: Positive for constipation and nausea. Negative for abdominal pain, diarrhea and vomiting.  Genitourinary: Negative for dysuria and urgency.  Musculoskeletal: Positive for arthralgias, back pain and myalgias.  Neurological: Positive for weakness. Negative for dizziness, light-headedness and headaches.  Psychiatric/Behavioral: Negative for dysphoric mood. The patient is not nervous/anxious.      Objective:  BP 134/70   Pulse 73   Temp (!) 94.7 F (34.8 C)   Ht 4\' 10"  (1.473 m)   Wt 88 lb (39.9 kg)   SpO2 99%   BMI 18.39 kg/m   BP/Weight 11/28/2020 10/06/2020 24/10/3534  Systolic BP 144 315 400  Diastolic BP 70 60 60  Wt. (Lbs) 88 88 83.6  BMI 18.39 18.39 17.47    Physical Exam Vitals reviewed.  Constitutional:      Appearance: Normal appearance. She is normal weight.  Neck:     Vascular: No carotid bruit.  Cardiovascular:     Rate and Rhythm: Normal rate and regular rhythm.     Pulses: Normal pulses.     Heart sounds: Normal heart sounds.  Pulmonary:     Effort: Pulmonary effort is normal. No respiratory distress.     Breath sounds: Normal breath sounds.  Abdominal:     General: Abdomen is flat. Bowel sounds are normal.     Palpations: Abdomen is soft.     Tenderness: There is no abdominal tenderness.  Neurological:     Mental Status: She is alert and oriented to person, place, and time.  Psychiatric:        Mood and Affect: Mood normal.        Behavior: Behavior normal.     Diabetic Foot Exam - Simple   No data filed      Lab Results  Component Value Date   WBC 10.8 08/25/2020   HGB 13.8 08/25/2020   HCT 41.1 08/25/2020   PLT 297 08/25/2020   GLUCOSE 86 10/06/2020   CHOL 329 (H) 08/25/2020   TRIG 129 08/25/2020   HDL 82 08/25/2020   LDLCALC 224 (H) 08/25/2020   ALT 17 10/06/2020   AST 24 10/06/2020   NA 138  10/06/2020   K 4.3 10/06/2020   CL 101 10/06/2020   CREATININE 1.22 (H) 10/06/2020   BUN 13 10/06/2020   CO2 24 10/06/2020   TSH 5.070 (H) 11/28/2020      Assessment & Plan:   1.  Acquired hypothyroidism - T4, free - TSH  2. Mixed hyperlipidemia Recommend zetia 10 mg once daily. Continue fish oil  Pt is not fasting today, so I cannot recheck lipids.  3. Gastroparesis  Recommend continue vitamins.  4. Paresthesia and pain of both upper extremities Continue duloxetine.  5. Moderate persistent asthma without complication Continue symbicort. Recommend albuterol hfa.  6. Gastroesophageal reflux disease without esophagitis  on cimetidine.  Orders Placed This Encounter  Procedures  . T4, free  . TSH     Follow-up: Return in about 10 weeks (around 02/06/2021) for fasting.  An After Visit Summary was printed and given to the patient.  [name], MD [facility] [phone]

## 2020-11-28 ENCOUNTER — Encounter: Payer: Self-pay | Admitting: Family Medicine

## 2020-11-28 ENCOUNTER — Ambulatory Visit (INDEPENDENT_AMBULATORY_CARE_PROVIDER_SITE_OTHER): Payer: Medicare Other | Admitting: Family Medicine

## 2020-11-28 ENCOUNTER — Telehealth: Payer: Self-pay

## 2020-11-28 ENCOUNTER — Other Ambulatory Visit: Payer: Self-pay

## 2020-11-28 VITALS — BP 134/70 | HR 73 | Temp 94.7°F | Ht <= 58 in | Wt 88.0 lb

## 2020-11-28 DIAGNOSIS — M79602 Pain in left arm: Secondary | ICD-10-CM

## 2020-11-28 DIAGNOSIS — K219 Gastro-esophageal reflux disease without esophagitis: Secondary | ICD-10-CM | POA: Diagnosis not present

## 2020-11-28 DIAGNOSIS — I129 Hypertensive chronic kidney disease with stage 1 through stage 4 chronic kidney disease, or unspecified chronic kidney disease: Secondary | ICD-10-CM

## 2020-11-28 DIAGNOSIS — E782 Mixed hyperlipidemia: Secondary | ICD-10-CM | POA: Diagnosis not present

## 2020-11-28 DIAGNOSIS — N1832 Chronic kidney disease, stage 3b: Secondary | ICD-10-CM

## 2020-11-28 DIAGNOSIS — E039 Hypothyroidism, unspecified: Secondary | ICD-10-CM | POA: Diagnosis not present

## 2020-11-28 DIAGNOSIS — R202 Paresthesia of skin: Secondary | ICD-10-CM | POA: Diagnosis not present

## 2020-11-28 DIAGNOSIS — K3184 Gastroparesis: Secondary | ICD-10-CM

## 2020-11-28 DIAGNOSIS — J4540 Moderate persistent asthma, uncomplicated: Secondary | ICD-10-CM | POA: Diagnosis not present

## 2020-11-28 DIAGNOSIS — M79601 Pain in right arm: Secondary | ICD-10-CM | POA: Diagnosis not present

## 2020-11-28 NOTE — Telephone Encounter (Signed)
CCM referral placed °

## 2020-11-29 ENCOUNTER — Telehealth: Payer: Self-pay

## 2020-11-29 LAB — T4, FREE: Free T4: 1.21 ng/dL (ref 0.82–1.77)

## 2020-11-29 LAB — TSH: TSH: 5.07 u[IU]/mL — ABNORMAL HIGH (ref 0.450–4.500)

## 2020-11-29 MED ORDER — EZETIMIBE 10 MG PO TABS: 10.0000 mg | ORAL_TABLET | Freq: Every day | ORAL | 2 refills | Status: DC

## 2020-11-29 NOTE — Telephone Encounter (Signed)
Valerie Kelley called to make Dr. Tobie Poet aware that she has decided to try the Zetia.  She request that the prescription be sent to [facility] in [place].

## 2020-12-06 ENCOUNTER — Telehealth: Payer: Self-pay | Admitting: Family Medicine

## 2020-12-06 NOTE — Progress Notes (Signed)
  Chronic Care Management   Note  12/06/2020 Name: Valerie Kelley MRN: [id number] DOB: 1934-07-15  Valerie Kelley is a 87 y.o. year old female who is a primary care patient of Cox, Kirsten, MD. I reached out to Valerie Kelley by phone today in response to a referral sent by Ms. Carmell Austria Divita's PCP, Cox, Kirsten, MD.   Ms. Grecco was given information about Chronic Care Management services today including:  1. CCM service includes personalized support from designated clinical staff supervised by her physician, including individualized plan of care and coordination with other care providers 2. 24/7 contact phone numbers for assistance for urgent and routine care needs. 3. Service will only be billed when office clinical staff spend 20 minutes or more in a month to coordinate care. 4. Only one practitioner may furnish and bill the service in a calendar month. 5. The patient may stop CCM services at any time (effective at the end of the month) by phone call to the office staff.   Patient agreed to services and verbal consent obtained.   Follow up plan:   Carley Perdue [employer] Scheduler

## 2020-12-14 DIAGNOSIS — M5416 Radiculopathy, lumbar region: Secondary | ICD-10-CM | POA: Diagnosis not present

## 2020-12-27 DIAGNOSIS — M5416 Radiculopathy, lumbar region: Secondary | ICD-10-CM | POA: Diagnosis not present

## 2021-01-04 ENCOUNTER — Other Ambulatory Visit: Payer: Self-pay | Admitting: Family Medicine

## 2021-01-10 ENCOUNTER — Telehealth: Payer: Self-pay

## 2021-01-10 NOTE — Progress Notes (Signed)
Chronic Care Management Pharmacy Note  01/17/2021 Name:  Valerie Kelley MRN:  [id number] DOB:  12/30/1933   Plan Updates:   Patient declines second COVID booster due to how sick the last shot made her. She is also unable to complete Shingrix series due to vaccine made her feel really bad. Patient reports she is very sensitive to medications and has a lot of problems with her stomach.   Patient declines Nexletol or Repatha as an option. She has been unable to tolerate Zetia or statins in the past. She has to be careful eating fruits and vegetables due to her stomach as well. Please consider updating diagnosis code to reflect statin intolerance due to muscle pain. Patient has begun additional supplements for cholesterol.   Patient is not interested in Prolia at this time. Not an ideal candidate for alendronate due to stomach issues. She cannot take supplemental calcium due to nephrologist.   Subjective: Valerie Kelley is an 87 y.o. year old female who is a primary patient of Cox, Kirsten, MD.  The CCM team was consulted for assistance with disease management and care coordination needs.    Engaged with patient by telephone for initial visit in response to provider referral for pharmacy case management and/or care coordination services.   Consent to Services:  The patient was given information about Chronic Care Management services, agreed to services, and gave verbal consent prior to initiation of services.  Please see initial visit note for detailed documentation.   Patient Care Team: [name], MD as PCP - General (Family Medicine) Thereasa Distance, MD as Referring Physician (Nephrology) Burnice Logan, [facility] as Pharmacist (Pharmacist)   Recent office visits:  12/27/20-Richard Ramos, Lumbar pain, [facility]  12/14/20-Carol Bayard Males, Lumbar pain, [facility]  12/08/20-Neurology, recommended hip injection  11/28/20-Dr. Cox PCP, patient preferred stopping Levothyroxine 32mg, Tsh  slightly abnormal, but free thyroxine was normal. No medicines at this time. Recheck in 3 months.   Recommend zetia 10 mg once daily.  11/21/20-Dr.Olivia Mackie Nephrology, no medication changes, avoid NSAIDS, follow up 628mo Recent consult visits:  none  Hospital visits:  None in previous 6 months  Objective:  Lab Results  Component Value Date   CREATININE 1.22 (H) 10/06/2020   BUN 13 10/06/2020   GFRNONAA 41 (L) 10/06/2020   GFRAA 47 (L) 10/06/2020   NA 138 10/06/2020   K 4.3 10/06/2020   CALCIUM 9.4 10/06/2020   CO2 24 10/06/2020   GLUCOSE 86 10/06/2020    No results found for: HGBA1C, FRUCTOSAMINE, GFR, MICROALBUR  Last diabetic Eye exam: No results found for: HMDIABEYEEXA  Last diabetic Foot exam: No results found for: HMDIABFOOTEX   Lab Results  Component Value Date   CHOL 329 (H) 08/25/2020   HDL 82 08/25/2020   LDLCALC 224 (H) 08/25/2020   TRIG 129 08/25/2020   CHOLHDL 4.0 08/25/2020    Hepatic Function Latest Ref Rng & Units 10/06/2020 08/25/2020 03/10/2020  Total Protein 6.0 - 8.5 g/dL 7.1 7.8 7.4  Albumin 3.6 - 4.6 g/dL 4.6 4.9(H) 4.8(H)  AST 0 - 40 IU/L '24 30 22  ' ALT 0 - 32 IU/L '17 22 13  ' Alk Phosphatase 44 - 121 IU/L 75 90 86  Total Bilirubin 0.0 - 1.2 mg/dL 0.3 0.6 0.4    Lab Results  Component Value Date/Time   TSH 5.070 (H) 11/28/2020 11:35 AM   TSH 2.830 10/06/2020 11:55 AM   FREET4 1.21 11/28/2020 11:35 AM   FREET4 1.42 10/06/2020 11:55  AM    CBC Latest Ref Rng & Units 08/25/2020 02/24/2020 04/16/2013  WBC 3.4 - 10.8 x10E3/uL 10.8 5.5 -  Hemoglobin 11.1 - 15.9 g/dL 13.8 12.8 10.3(L)  Hematocrit 34.0 - 46.6 % 41.1 38.8 29.5(L)  Platelets 150 - 450 x10E3/uL 297 234 -    Lab Results  Component Value Date/Time   VD25OH 55.0 08/25/2020 10:06 AM    Clinical ASCVD: No  The ASCVD Risk score Mikey Bussing DC Jr., et al., 2013) failed to calculate for the following reasons:   The 2013 ASCVD risk score is only valid for ages 76 to 31    Depression screen  PHQ 2/9 11/28/2020 10/06/2020 08/25/2020  Decreased Interest 0 0 0  Down, Depressed, Hopeless 0 0 0  PHQ - 2 Score 0 0 0  Altered sleeping 0 - -  Tired, decreased energy 1 - -  Change in appetite 0 - -  Feeling bad or failure about yourself  0 - -  Trouble concentrating 0 - -  Moving slowly or fidgety/restless 0 - -  Suicidal thoughts 0 - -  PHQ-9 Score 1 - -  Difficult doing work/chores Not difficult at all - -     Social History   Tobacco Use  Smoking Status Never Smoker  Smokeless Tobacco Never Used   BP Readings from Last 3 Encounters:  11/28/20 134/70  10/06/20 110/60  08/25/20 120/60   Pulse Readings from Last 3 Encounters:  11/28/20 73  10/06/20 80  08/25/20 72   Wt Readings from Last 3 Encounters:  11/28/20 88 lb (39.9 kg)  10/06/20 88 lb (39.9 kg)  08/25/20 83 lb 9.6 oz (37.9 kg)   BMI Readings from Last 3 Encounters:  11/28/20 18.39 kg/m  10/06/20 18.39 kg/m  08/25/20 17.47 kg/m    Assessment/Interventions: Review of patient past medical history, allergies, medications, health status, including review of consultants reports, laboratory and other test data, was performed as part of comprehensive evaluation and provision of chronic care management services.   SDOH:  (Social Determinants of Health) assessments and interventions performed: Yes  SDOH Screenings   Alcohol Screen: Not on file  Depression (PHQ2-9): Low Risk   . PHQ-2 Score: 1  Financial Resource Strain: Not on file  Food Insecurity: No Food Insecurity  . Worried About Charity fundraiser in the Last Year: Never true  . Ran Out of Food in the Last Year: Never true  Housing: Low Risk   . Last Housing Risk Score: 0  Physical Activity: Insufficiently Active  . Days of Exercise per Week: 5 days  . Minutes of Exercise per Session: 10 min  Social Connections: Not on file  Stress: Not on file  Tobacco Use: Low Risk   . Smoking Tobacco Use: Never Smoker  . Smokeless Tobacco Use: Never Used   Transportation Needs: No Transportation Needs  . Lack of Transportation (Medical): No  . Lack of Transportation (Non-Medical): No    CCM Care Plan  Allergies  Allergen Reactions  . Codeine Nausea And Vomiting and Nausea Only  . Darvocet [Propoxyphene N-Acetaminophen] Nausea And Vomiting  . Other Hives and Nausea And Vomiting  . Prednisone Swelling and Other (See Comments)    Face swells and constant headache And headache  . Pregabalin Other (See Comments)    Makes her feel drunk.  . Shellfish Allergy Hives  . Statins     Muscle pain.    Earnestine Mealing [Colesevelam]     Muscle pain  . Zetia [Ezetimibe]   .  Penicillins Rash    Medications Reviewed Today    Reviewed by [name], MD (Physician) on 11/28/20 at 1112  Med List Status: <None>  Medication Order Taking? Sig Documenting Provider Last Dose Status Informant  acetaminophen (TYLENOL) 325 MG tablet [id number] No Take 325 mg by mouth every evening. For pain [provider] Taking Active Self  albuterol (PROVENTIL HFA;VENTOLIN HFA) 108 (90 BASE) MCG/ACT inhaler [id number] No Inhale 2 puffs into the lungs every 6 (six) hours as needed for wheezing. For wheezing [provider] Taking Active Self  b complex vitamins capsule [id number] No Take by mouth. [provider] Taking Active   budesonide-formoterol (SYMBICORT) 160-4.5 MCG/ACT inhaler [id number] No Inhale into the lungs. [provider] Taking Active   cholecalciferol (VITAMIN D) 1000 UNITS tablet [id number] No Take 1,000 Units by mouth daily. [provider] Taking Active Self  cimetidine (TAGAMET) 300 MG tablet [id number] No Take 300 mg by mouth daily. [provider] Taking Active   co-enzyme Q-10 30 MG capsule [id number] No Take 30 mg by mouth daily. [provider] Taking Active   DULoxetine (CYMBALTA) 20 MG capsule [id number] No TAKE 1 CAPSULE BY MOUTH TWICE A DAY Cox, Kirsten, MD Taking Active   fish oil-omega-3 fatty  acids 1000 MG capsule [id number] No Take 2 g by mouth daily. [provider] Taking Active Self  loratadine (CLARITIN) 10 MG tablet [id number] No Take 10 mg by mouth daily. [provider] Taking Active Self  polyethylene glycol (MIRALAX / GLYCOLAX) packet [id number] No Take 51 g by mouth daily. Takes 3 capfuls daily [provider] Taking Active Self  tiotropium (SPIRIVA) 18 MCG inhalation capsule [id number] No Place 18 mcg into inhaler and inhale daily. [provider] Taking Active Self  traZODone (DESYREL) 50 MG tablet [id number] No Take 50 mg by mouth at bedtime. [provider] Taking Active Self  TURMERIC PO [id number] No Take by mouth. [provider] Taking Active   vitamin E 400 UNIT capsule [id number] No Take 800 Units by mouth daily. [provider] Taking Active Self          Patient Active Problem List   Diagnosis Date Noted  . Prerenal azotemia 09/13/2020  . Moderate persistent asthma without complication 73/42/8768  . Osteoporosis, post-menopausal 08/24/2020  . Hypertensive kidney disease with stage 4 chronic kidney disease ([place]) 08/24/2020  . Palpitations 07/21/2020  . Dermatitis 06/11/2020  . Atopic dermatitis 06/02/2020  . Mixed hyperlipidemia 02/24/2020  . Hypothyroidism 02/24/2020  . Gastroparesis 02/24/2020  . Chronic idiopathic constipation 02/24/2020  . Spinal stenosis in cervical region 06/23/2018  . DDD (degenerative disc disease), cervical 04/21/2018  . Osteoarthritis of multiple joints 05/24/2016  . Pain in joints 05/24/2016    Immunization History  Administered Date(s) Administered  . Influenza Split 06/05/2017, 06/05/2019, 06/01/2020  . Influenza-Unspecified 06/05/2017  . PFIZER(Purple Top)SARS-COV-2 Vaccination 12/04/2019, 12/30/2019, 07/08/2020  . Pneumococcal Conjugate-13 12/06/2014  . Pneumococcal Polysaccharide-23 07/23/1998, 03/22/2020  . Tdap 07/30/2012    Conditions to be  addressed/monitored:  Hypertension, Hyperlipidemia, Asthma, Chronic Kidney Disease, Hypothyroidism, Osteoporosis and Osteoarthritis  Care Plan : [place]  Updates made by Burnice Logan, RPH since 01/17/2021 12:00 AM    Problem: htn, hld, osteoporosis   Priority: High  Onset Date: 01/17/2021    Goal: Disease State Management   Start Date: 01/17/2021  Expected End Date: 01/17/2022  This Visit's Progress: On track  Priority: High  Note:    Current Barriers:  . Unable  to achieve control of cholesterol   Pharmacist Clinical Goal(s):  Marland Kitchen Patient will achieve control of cholesterol as evidenced by lipid panel  through collaboration with PharmD and provider.   Interventions: . 1:1 collaboration with [name], MD regarding development and update of comprehensive plan of care as evidenced by provider attestation and co-signature . Inter-disciplinary care team collaboration (see longitudinal plan of care) . Comprehensive medication review performed; medication list updated in electronic medical record  Hypertension (BP goal <130/80) -Not ideally controlled -Current treatment: . diet and lifestyle  -Medications previously tried:  None reported -Current home readings:  145/70 pulse 71, 143/68 pulse 74 -Current dietary habits: oatmeal for breakfast, potatoes, broccoli, carrots, chicken, mashed potatoes, snacks on junk food. Drinks 2 - 32 ounce bottles of water daily.  -Current exercise habits: walks to mailbox and works in house/yard -Denies hypotensive/hypertensive symptoms -Educated on BP goals and benefits of medications for prevention of heart attack, stroke and kidney damage; Daily salt intake goal < 2300 mg; Exercise goal of 150 minutes per week; Importance of home blood pressure monitoring; Proper BP monitoring technique; -Counseled to monitor BP at home weekly, document, and provide log at future appointments -Counseled on diet and exercise extensively Educated on  benefits of blood pressure control for kidney protection.   Hyperlipidemia: (LDL goal < 100) -Uncontrolled -Current treatment: . Fish oil 1000 mg 2 capsules daily  . Lecithin granules daily . Flaxseed oil daily  -Medications previously tried: statins, zetia (stomach problems - stopped February) -Current dietary patterns: eats at home mainly. Potatoes, chicken and oatmeal are most common foods that she can tolerate.   -Current exercise habits: walks to mail box and  -Educated on Cholesterol goals;  Benefits of statin for ASCVD risk reduction; Importance of limiting foods high in cholesterol; Exercise goal of 150 minutes per week; -Counseled on diet and exercise extensively Educated on benefits of cholesterol management and options of non-statin medications. Patient reports that her stomach is very sensitive to medications and she is unable to tolerate many things.  Patient declines Repatha or Nexletol.   Asthma (Goal: control symptoms and prevent exacerbations) -Controlled -Current treatment  . Spiriva 18 mcg daily prn . Symbicort 1 puff daily prn  . Albuterol inhaler 2 puffs every 6 hours prn wheezing -Medications previously tried: none reported  -Exacerbations requiring treatment in last 6 months: none -Patient denies consistent use of maintenance inhaler -Frequency of rescue inhaler use: rarely -Counseled on Proper inhaler technique; -Assessed patient's breathing. She reports asthma flares with allergy season. She reports managing well currently and rarely using inhalers.   Osteoporosis / Osteopenia (Goal reduce risk of osteoporotic fracture) -Uncontrolled -Last DEXA Scan: 04/13/2020   T-Score femoral neck: -2.7  T-Score forearm radius: -2.6   -Patient is a candidate for pharmacologic treatment due to T-Score < -2.5 in femoral neck -Current treatment  . Vitamin d 5000 units daily  -Medications previously tried:  calcium (stopped due to kidneys) -Recommend [phone] units of  vitamin D daily. Recommend 1200 mg of calcium daily from dietary and supplemental sources. Recommend weight-bearing and muscle strengthening exercises for building and maintaining bone density. -Counseled on diet and exercise extensively Recommended to continue current medication. Patient declines Prolia due to sensitivity to medications and she is unable to take calcium due to kidneys.   Insomnia (Goal: improve sleep) -Controlled -Current treatment  . Trazodone 50 mg daily  -Medications previously tried: none reported  -Recommended to continue current medication   Constipation (Goal: improve bowel regularity) -Controlled -  Current treatment  . miralax daily prn . Senokot daily  -Medications previously tried: none reported  -Recommended to continue current medication  Health Maintenance -Vaccine gaps:  Shingles shot made her sick in March so won't be getting the second shot. Due for COVID second booster but patient declines.  -Current therapy:  . acetaminophen 650 mg twice daily for hip pain  . b complex vitamins daily . Loratadine 10 mg daily prn  . Miralax daily prn  . Turmeric daily . Vitamin E 400 units daily  . Coenzyme q-10 30 mg daily  . Duloxetine 20 mg daily for night sweats . Women's alive 50+ daily (started 3 days ago) -Educated on Herbal supplement research is limited and benefits usually cannot be proven -Patient is satisfied with current therapy and denies issues -Counseled on diet and exercise extensively   Patient Goals/Self-Care Activities . Patient will:  - take medications as prescribed focus on medication adherence by using pill box check blood pressure weekly, document, and provide at future appointments target a minimum of 150 minutes of moderate intensity exercise weekly engage in dietary modifications by limiting sodium, drinking plenty of water and consuming heart healthy foods as tolerated with stomach.   Follow Up Plan: Telephone follow up  appointment with care management team member scheduled for: 06/2021        Medication Assistance: None required.  Patient affirms current coverage meets needs.  Patient's preferred pharmacy is:  [facility] ([place], [place] [address] Phone: [phone] Fax: [phone]  [id number] [place] [place] [address]Phone: [phone]: [phone] Uses pill box? Yes Pt endorses 100% compliance - denies missed doses of what she does take  We discussed: Current pharmacy is preferred with insurance plan and patient is satisfied with pharmacy services Patient decided to: Continue current medication management strategy  Care Plan and Follow Up Patient Decision:  Patient agrees to Care Plan and Follow-up.  Plan: Telephone follow up appointment with care management team member scheduled for:  06/2021

## 2021-01-10 NOTE — Progress Notes (Signed)
Chronic Care Management Pharmacy Assistant   Name: Valerie Kelley  MRN: [id number] DOB: [birth year]  Valerie Kelley is an 87 y.o. year old female who presents for his initial CCM visit with the clinical pharmacist.   Conditions to be addressed/monitored: HLD and CKD Stage 4 and Hypothyroid, HTN  Recent office visits:  12/27/20-Richard Ramos, Lumbar pain, [facility]  12/14/20-Carol Knowles, Lumbar pain, [facility]  12/08/20-Neurology, recommended hip injection  11/28/20-Dr. Cox PCP, patient preferred stopping Levothyroxine 20mcg, Tsh slightly abnormal, but free thyroxine was normal. No medicines at this time. Recheck in 3 months.   Recommend zetia 10 mg once daily.  11/21/20-DrOlivia Mackie, Nephrology, no medication changes, avoid NSAIDS, follow up 27mos  Recent consult visits:  none  Hospital visits:  None in previous 6 months  Medications: Outpatient Encounter Medications as of 01/10/2021  Medication Sig  . acetaminophen (TYLENOL) 325 MG tablet Take 325 mg by mouth every evening. For pain  . albuterol (PROVENTIL HFA;VENTOLIN HFA) 108 (90 BASE) MCG/ACT inhaler Inhale 2 puffs into the lungs every 6 (six) hours as needed for wheezing. For wheezing  . b complex vitamins capsule Take by mouth.  . budesonide-formoterol (SYMBICORT) 160-4.5 MCG/ACT inhaler Inhale into the lungs.  . cholecalciferol (VITAMIN D) 1000 UNITS tablet Take 1,000 Units by mouth daily.  . cimetidine (TAGAMET) 300 MG tablet Take 300 mg by mouth daily.  Marland Kitchen co-enzyme Q-10 30 MG capsule Take 30 mg by mouth daily.  . DULoxetine (CYMBALTA) 20 MG capsule TAKE 1 CAPSULE BY MOUTH TWICE A DAY  . ezetimibe (ZETIA) 10 MG tablet Take 1 tablet (10 mg total) by mouth daily.  . fish oil-omega-3 fatty acids 1000 MG capsule Take 2 g by mouth daily.  Marland Kitchen loratadine (CLARITIN) 10 MG tablet Take 10 mg by mouth daily.  . polyethylene glycol (MIRALAX / GLYCOLAX) packet Take 51 g by mouth daily. Takes 3 capfuls daily  . tiotropium  (SPIRIVA) 18 MCG inhalation capsule Place 18 mcg into inhaler and inhale daily.  . traZODone (DESYREL) 50 MG tablet TAKE 2 TABLETS BY MOUTH EVERY NIGHT  . TURMERIC PO Take by mouth.  . vitamin E 400 UNIT capsule Take 800 Units by mouth daily.   No facility-administered encounter medications on file as of 01/10/2021.     No results found for: HGBA1C, MICROALBUR   BP Readings from Last 3 Encounters:  11/28/20 134/70  10/06/20 110/60  08/25/20 120/60      . Have you seen any other providers since your last visit with PCP? Yes, patient stated she had a hop injection on 12/27/20, she said it doesn't seem to have helped much.   . Any changes in your medications or health? Yes, patient said she stopped taking Zetia on 12/02/20-only took for 2 days, she stopped taking Thyroid medication on 11/07/20, she stated they messed up her stomach, caused her bones and muscles to hurt as well.  She has shingles shot on 12/15/20 and did not tolerate it well either.   Any side effects from any medications? Yes, patient said she stopped taking Zetia on 12/02/20-only took for 2 days, she stopped taking Thyroid medication on 11/07/20, she stated they messed up her stomach, caused her bones and muscles to hurt as well.  She has shingles shot on 12/15/20 and did not tolerate it well either.   . Do you have an symptoms or problems not managed by your medications? No, patient has not unmanaged symptoms at this time.   Marland Kitchen  Any concerns about your health right now? Yes, patient stated she would like for her muscles and bones to stop hurting.   Marland Kitchen Has your provider asked that you check blood pressure, blood sugar, or follow special diet at home? Yes, patient stated she is checking her blood pressures, blood sugars and is watching her diet, she said her levels seem to be coming down after the thyroid medication made them run high.   . Do you get any type of exercise on a regular basis? Yes, patient stays active, but was more  active before she started the Zetia and thyroid medication.   . Can you think of a goal you would like to reach for your health? Yes, patient would like to get back to not hurting.   . Do you have any problems getting your medications? No o Patient's preferred pharmacy is:  [facility] ([place], [place] [address] Phone: [phone] Fax: [phone]  [id number] [place], [place] [address] [place] [address] Phone: [phone] Fax: [phone]   . Is there anything that you would like to discuss during the appointment? Yes, she would like to discuss her bone/muscle pain and her sensitivity to shots/medications.   Valerie Kelley was reminded to have all medications, supplements and any blood glucose and blood pressure readings available for review with Clarise Cruz B. Rooks CPP.   Follow-Up:  Pharmacist Review  [name], CPP notified  Clarita Leber, [place] Pharmacist Assistant [phone]

## 2021-01-17 ENCOUNTER — Ambulatory Visit (INDEPENDENT_AMBULATORY_CARE_PROVIDER_SITE_OTHER): Payer: Medicare Other

## 2021-01-17 ENCOUNTER — Other Ambulatory Visit: Payer: Self-pay

## 2021-01-17 DIAGNOSIS — N184 Chronic kidney disease, stage 4 (severe): Secondary | ICD-10-CM | POA: Diagnosis not present

## 2021-01-17 DIAGNOSIS — E7849 Other hyperlipidemia: Secondary | ICD-10-CM | POA: Diagnosis not present

## 2021-01-17 DIAGNOSIS — I129 Hypertensive chronic kidney disease with stage 1 through stage 4 chronic kidney disease, or unspecified chronic kidney disease: Secondary | ICD-10-CM | POA: Diagnosis not present

## 2021-01-17 DIAGNOSIS — M810 Age-related osteoporosis without current pathological fracture: Secondary | ICD-10-CM

## 2021-01-17 DIAGNOSIS — E782 Mixed hyperlipidemia: Secondary | ICD-10-CM

## 2021-01-17 NOTE — Patient Instructions (Addendum)
Visit Information  Thank you for your time discussing your medications. I look forward to working with you to achieve your health care goals. Below is a summary of what we talked about during our visit.   Goals Addressed            This Visit's Progress   . Learn More About My Health       Timeframe:  Long-Range Goal Priority:  High Start Date:                             Expected End Date:                        Follow Up Date 06/28/2021   - tell my story and reason for my visit - ask questions - repeat what I heard to make sure I understand - bring a list of my medicines to the visit - speak up when I don't understand    Why is this important?    The best way to learn about your health and care is by talking to the doctor and nurse.   They will answer your questions and give you information in the way that you like best.    Notes:     Marland Kitchen Manage My Medicine       Timeframe:  Long-Range Goal Priority:  High Start Date:                             Expected End Date:                       Follow Up Date 06/28/2021    - call for medicine refill 2 or 3 days before it runs out - keep a list of all the medicines I take; vitamins and herbals too - use a pillbox to sort medicine    Why is this important?   . These steps will help you keep on track with your medicines.   Notes:     Marland Kitchen Manage Pain-Osteoarthritis       Timeframe:  Long-Range Goal Priority:  High Start Date:                             Expected End Date:                       Follow Up Date 06/28/2021    - call for medicine refill 2 or 3 days before it runs out - plan exercise or activity when pain is best controlled - stay active - use ice or heat for pain relief    Why is this important?    Day-to-day life can be hard when you have joint pain.   Pain medicine is just one piece of the treatment puzzle. There are many things you can do to manage pain and stay strong.    Lifestyle changes like  stopping smoking and eating foods with Vitamin D and calcium keeps your bones and muscles healthy. Your joints are better when supported by strong muscles.   You can try these action steps to help you manage your pain.     Notes:        Patient Care Plan: CCM Pharmacy Care Plan    Problem Identified: htn, hld,  osteoporosis   Priority: High  Onset Date: 01/17/2021    Goal: Disease State Management   Start Date: 01/17/2021  Expected End Date: 01/17/2022  This Visit's Progress: On track  Priority: High  Note:    Current Barriers:  . Unable to achieve control of cholesterol   Pharmacist Clinical Goal(s):  Marland Kitchen Patient will achieve control of cholesterol as evidenced by lipid panel  through collaboration with PharmD and provider.   Interventions: . 1:1 collaboration with [name], MD regarding development and update of comprehensive plan of care as evidenced by provider attestation and co-signature . Inter-disciplinary care team collaboration (see longitudinal plan of care) . Comprehensive medication review performed; medication list updated in electronic medical record  Hypertension (BP goal <130/80) -Not ideally controlled -Current treatment: . diet and lifestyle  -Medications previously tried:  None reported -Current home readings:  145/70 pulse 71, 143/68 pulse 74 -Current dietary habits: oatmeal for breakfast, potatoes, broccoli, carrots, chicken, mashed potatoes, snacks on junk food. Drinks 2 - 32 ounce bottles of water daily.  -Current exercise habits: walks to mailbox and works in house/yard -Denies hypotensive/hypertensive symptoms -Educated on BP goals and benefits of medications for prevention of heart attack, stroke and kidney damage; Daily salt intake goal < 2300 mg; Exercise goal of 150 minutes per week; Importance of home blood pressure monitoring; Proper BP monitoring technique; -Counseled to monitor BP at home weekly, document, and provide log at future  appointments -Counseled on diet and exercise extensively Educated on benefits of blood pressure control for kidney protection.   Hyperlipidemia: (LDL goal < 100) -Uncontrolled -Current treatment: . Fish oil 1000 mg 2 capsules daily  . Lecithin granules daily . Flaxseed oil daily  -Medications previously tried: statins, zetia (stomach problems - stopped February) -Current dietary patterns: eats at home mainly. Potatoes, chicken and oatmeal are most common foods that she can tolerate.   -Current exercise habits: walks to mail box and  -Educated on Cholesterol goals;  Benefits of statin for ASCVD risk reduction; Importance of limiting foods high in cholesterol; Exercise goal of 150 minutes per week; -Counseled on diet and exercise extensively Educated on benefits of cholesterol management and options of non-statin medications. Patient reports that her stomach is very sensitive to medications and she is unable to tolerate many things.  Patient declines Repatha or Nexletol.   Asthma (Goal: control symptoms and prevent exacerbations) -Controlled -Current treatment  . Spiriva 18 mcg daily prn . Symbicort 1 puff daily prn  . Albuterol inhaler 2 puffs every 6 hours prn wheezing -Medications previously tried: none reported  -Exacerbations requiring treatment in last 6 months: none -Patient denies consistent use of maintenance inhaler -Frequency of rescue inhaler use: rarely -Counseled on Proper inhaler technique; -Assessed patient's breathing. She reports asthma flares with allergy season. She reports managing well currently and rarely using inhalers.   Osteoporosis / Osteopenia (Goal reduce risk of osteoporotic fracture) -Uncontrolled -Last DEXA Scan: 04/13/2020   T-Score femoral neck: -2.7  T-Score forearm radius: -2.6   -Patient is a candidate for pharmacologic treatment due to T-Score < -2.5 in femoral neck -Current treatment  . Vitamin d 5000 units daily  -Medications previously  tried:  calcium (stopped due to kidneys) -Recommend [id number] units of vitamin D daily. Recommend 1200 mg of calcium daily from dietary and supplemental sources. Recommend weight-bearing and muscle strengthening exercises for building and maintaining bone density. -Counseled on diet and exercise extensively Recommended to continue current medication. Patient declines Prolia due to sensitivity to medications  and she is unable to take calcium due to kidneys.   Insomnia (Goal: improve sleep) -Controlled -Current treatment  . Trazodone 50 mg daily  -Medications previously tried: none reported  -Recommended to continue current medication   Constipation (Goal: improve bowel regularity) -Controlled -Current treatment  . miralax daily prn . Senokot daily  -Medications previously tried: none reported  -Recommended to continue current medication  Health Maintenance -Vaccine gaps:  Shingles shot made her sick in March so won't be getting the second shot. Due for COVID second booster but patient declines.  -Current therapy:  . acetaminophen 650 mg twice daily for hip pain  . b complex vitamins daily . Loratadine 10 mg daily prn  . Miralax daily prn  . Turmeric daily . Vitamin E 400 units daily  . Coenzyme q-10 30 mg daily  . Duloxetine 20 mg daily for night sweats . Women's alive 50+ daily (started 3 days ago) -Educated on Herbal supplement research is limited and benefits usually cannot be proven -Patient is satisfied with current therapy and denies issues -Counseled on diet and exercise extensively   Patient Goals/Self-Care Activities . Patient will:  - take medications as prescribed focus on medication adherence by using pill box check blood pressure weekly, document, and provide at future appointments target a minimum of 150 minutes of moderate intensity exercise weekly engage in dietary modifications by limiting sodium, drinking plenty of water and consuming heart healthy foods as  tolerated with stomach.   Follow Up Plan: Telephone follow up appointment with care management team member scheduled for: 06/2021        Valerie Kelley was given information about Chronic Care Management services today including:  1. CCM service includes personalized support from designated clinical staff supervised by her physician, including individualized plan of care and coordination with other care providers 2. 24/7 contact phone numbers for assistance for urgent and routine care needs. 3. Standard insurance, coinsurance, copays and deductibles apply for chronic care management only during months in which we provide at least 20 minutes of these services. Most insurances cover these services at 100%, however patients may be responsible for any copay, coinsurance and/or deductible if applicable. This service may help you avoid the need for more expensive face-to-face services. 4. Only one practitioner may furnish and bill the service in a calendar month. 5. The patient may stop CCM services at any time (effective at the end of the month) by phone call to the office staff.  Patient agreed to services and verbal consent obtained.   The patient verbalized understanding of instructions, educational materials, and care plan provided today and declined offer to receive copy of patient instructions, educational materials, and care plan.  Telephone follow up appointment with pharmacy team member scheduled for:06/2021  [name], PharmD Clinical Pharmacist [place] [phone] (office) [phone] (mobile)  Eating Plan for Osteoporosis Osteoporosis causes your bones to become weak and brittle. This puts you at greater risk for bone breaks (fractures) from small bumps or falls. Making changes to your diet and increasing your physical activity can help strengthen your bones and improve your overall health. Calcium and vitamin D are nutrients that play an important role in bone  health. Vitamin D helps your body use calcium and strengthen bones. It is important to get enough calcium and vitamin D as part of your eating plan for osteoporosis. What are tips for following this plan? Reading food labels  Try to get at least 1,000 milligrams (mg) of calcium  each day.  Look for foods that have at least 50 mg of calcium per serving.  Talk with your health care provider about taking a calcium supplement if you do not get enough calcium from food.  Do not have more than 2,500 mg of calcium each day. This is the upper limit for food and nutritional supplements combined. Too much calcium may cause constipation and prevent you from absorbing other important nutrients.  Choose foods that contain vitamin D.  Take a daily vitamin supplement that contains 800-1,000 international units (IU) of vitamin D. The amount may be different depending on your age, body weight, and where you live. Talk with your dietitian or health care provider about how much vitamin D is right for you.  Avoid foods that have more than 300 mg of sodium per serving. Too much sodium can cause your body to lose calcium.  Talk with your dietitian or health care provider about how much sodium you are allowed each day. Shopping  Do not buy foods with added salt, including: ? Salted snacks. ? Angie Fava. ? Canned soups. ? Canned meats. ? Processed meats, such as bacon or precooked or cured meat like sausages or meat loaves. ? Smoked fish. Meal planning  Eat balanced meals that contain protein foods, fruits and vegetables, and foods rich in calcium and vitamin D.  Eat at least 5 servings of fruits and vegetables each day.  Eat 5-6 oz (142-170 g) of lean meat, poultry, fish, eggs, or beans each day. Lifestyle  Do not use any products that contain nicotine or tobacco, such as cigarettes, e-cigarettes, and chewing tobacco. If you need help quitting, ask your health care provider.  If your health care provider  recommends that you lose weight: ? Work with a dietitian to develop an eating plan that will help you reach your desired weight goal. ? Exercise for at least 30 minutes a day, 5 or more days a week, or as told by your health care provider.  Work with a physical therapist to develop an exercise plan that includes flexibility, balance, and strength exercises. Do not focus only on aerobic exercise.  Do not drink alcohol if: ? Your health care provider tells you not to drink. ? You are pregnant, may be pregnant, or are planning to become pregnant.  If you drink alcohol: ? Limit how much you use to:  0-1 drink a day for women.  0-2 drinks a day for men. ? Be aware of how much alcohol is in your drink. In the [place] one drink equals one 12 oz bottle of beer (355 mL), one 5 oz glass of wine (148 mL), or one 1 oz glass of hard liquor (44 mL). What foods should I eat? Foods high in calcium  Yogurt. Yogurt with fruit.  Milk. Evaporated skim milk. Dry milk powder.  Calcium-fortified orange juice.  Parmesan cheese. Part-skim ricotta cheese. Natural hard cheese. Cream cheese. Cottage cheese.  Canned sardines. Canned salmon.  Calcium-treated tofu. Calcium-fortified cereal bar. Calcium-fortified cereal. Calcium-fortified graham crackers.  Cooked collard greens. Turnip greens. Broccoli. Kale.  Almonds.  White beans.  Corn tortilla.   Foods high in vitamin D  Cod liver oil. Fatty fish, such as tuna, mackerel, and salmon.  Milk. Fortified soy milk. Fortified fruit juice.  Yogurt. Margarine.  Egg yolks. Foods high in protein  Beef. Lamb. Pork tenderloin.  Chicken breast.  Tuna (canned). Fish fillet.  Tofu.  Cooked soy beans. Soy patty. Beans (canned or cooked).  Cottage cheese.  Yogurt.  Peanut butter.  Pumpkin seeds. Nuts. Sunflower seeds.  Hard cheese.  Milk or other milk products, such as soy milk. The items listed above may not be a complete list of foods and  beverages you can eat. Contact a dietitian for more options. Summary  Calcium and vitamin D are nutrients that play an important role in bone health and are an important part of your eating plan for osteoporosis.  Eat balanced meals that contain protein foods, fruits and vegetables, and foods rich in calcium and vitamin D.  Avoid foods that have more than 300 mg of sodium per serving. Too much sodium can cause your body to lose calcium.  Exercise is an important part of prevention and treatment of osteoporosis. Aim for at least 30 minutes a day, 5 days a week. This information is not intended to replace advice given to you by your health care provider. Make sure you discuss any questions you have with your health care provider. Document Revised: 02/25/2020 Document Reviewed: 02/25/2020 Elsevier Patient Education  [place].

## 2021-01-29 ENCOUNTER — Encounter: Payer: Self-pay | Admitting: Family Medicine

## 2021-02-17 DIAGNOSIS — M5416 Radiculopathy, lumbar region: Secondary | ICD-10-CM

## 2021-02-17 HISTORY — DX: Radiculopathy, lumbar region: M54.16

## 2021-02-23 NOTE — Progress Notes (Signed)
Subjective:  Patient ID: Valerie Kelley, female    DOB: 1934-07-07  Age: 87 y.o. MRN: [id number]  Chief Complaint  Patient presents with   Hyperlipidemia    HPI Mixed hyperlipidemia  Her LDL dropped from 224 to 123 without rx medications. She has been eating healthy and is currently taking flaxseed oil once daily and OTC fish oil 2 capsules daily. Pt weights 90 lbs. She eats healthy. She was on zetia, but stopped it in 11/2020. She did not like how it made her feel.   Gastroesophageal reflux disease without esophagitis On tagamet.   Moderate persistent asthma: on spiriva and symbicort.   Hypothyroidism: abnormal tsh. Pt did not tolerate synthroid. She refuses to take any medicine. She wants to try an herbal.   Current Outpatient Medications on File Prior to Visit  Medication Sig Dispense Refill   ACETAMINOPHEN PO Take 650 mg by mouth in the morning and at bedtime. For pain p     albuterol (PROVENTIL HFA;VENTOLIN HFA) 108 (90 BASE) MCG/ACT inhaler Inhale 2 puffs into the lungs every 6 (six) hours as needed for wheezing. For wheezing     b complex vitamins capsule Take 1 capsule by mouth daily.     budesonide-formoterol (SYMBICORT) 160-4.5 MCG/ACT inhaler Inhale 1 puff into the lungs daily as needed.     Cholecalciferol (VITAMIN D3) 125 MCG (5000 UT) TBDP Take 5,000 Units by mouth daily.     cimetidine (TAGAMET) 300 MG tablet Take 300 mg by mouth daily.     co-enzyme Q-10 30 MG capsule Take 30 mg by mouth daily.     DULoxetine (CYMBALTA) 20 MG capsule TAKE 1 CAPSULE BY MOUTH TWICE A DAY (Patient taking differently: Take 20 mg by mouth daily.) 180 capsule 1   fish oil-omega-3 fatty acids 1000 MG capsule Take 2 capsules by mouth daily. Patient using wild [place] salmon product with 2400 mg     Flaxseed Oil OIL by Does not apply route daily.     Glucosamine Sulfate 1000 MG CAPS Take 1 capsule by mouth daily.     Lecithin GRAN by Does not apply route daily.     loratadine (CLARITIN) 10  MG tablet Take 10 mg by mouth daily as needed.     Multiple Vitamins-Minerals (ALIVE WOMENS 50+) TABS Take 1 tablet by mouth daily at 12 noon.     polyethylene glycol (MIRALAX / GLYCOLAX) packet Take 51 g by mouth daily as needed. Takes 3 capfuls daily     senna (SENOKOT) 8.6 MG tablet Take 1 tablet by mouth daily.     tiotropium (SPIRIVA) 18 MCG inhalation capsule Place 18 mcg into inhaler and inhale daily as needed.     TURMERIC PO Take 1 capsule by mouth daily.     vitamin E 400 UNIT capsule Take 400 Units by mouth daily.     No current facility-administered medications on file prior to visit.   Past Medical History:  Diagnosis Date   Arthritis    "all over" (04/15/2013)   Asthma    Emphysema    "mild" (04/15/2013)   Exertional shortness of breath    Gastric paresis    GERD (gastroesophageal reflux disease)    H/O hiatal hernia    High cholesterol    Hypothyroidism    Myalgia due to statin    Stage III chronic kidney disease ([place])    Past Surgical History:  Procedure Laterality Date   ABDOMINAL HYSTERECTOMY     REVERSE SHOULDER ARTHROPLASTY  Left 04/15/2013   REVERSE SHOULDER ARTHROPLASTY Left 04/15/2013   Procedure: REVERSE SHOULDER ARTHROPLASTY LEFT;  Surgeon: Augustin Schooling, MD;  Location: [facility];  Service: Orthopedics;  Laterality: Left;   SINUS EXPLORATION     TONSILLECTOMY      Family History  Problem Relation Age of Onset   Stroke Mother    Cancer Brother        brain   Cancer Brother        leukemia   Cancer Brother        lung   Social History   Socioeconomic History   Marital status: Widowed    Spouse name: Not on file   Number of children: 2   Years of education: Not on file   Highest education level: Not on file  Occupational History   Occupation: Retired  Tobacco Use   Smoking status: Never   Smokeless tobacco: Never  Substance and Sexual Activity   Alcohol use: No   Drug use: No   Sexual activity: Not Currently  Other Topics Concern   Not on  file  Social History Narrative   Not on file   Social Determinants of Health   Financial Resource Strain: Not on file  Food Insecurity: No Food Insecurity   Worried About Running Out of Food in the Last Year: Never true   [place] in the Last Year: Never true  Transportation Needs: No Transportation Needs   Lack of Transportation (Medical): No   Lack of Transportation (Non-Medical): No  Physical Activity: Insufficiently Active   Days of Exercise per Week: 5 days   Minutes of Exercise per Session: 10 min  Stress: Not on file  Social Connections: Not on file    Review of Systems  Constitutional:  Positive for fatigue. Negative for chills and fever.  HENT:  Negative for congestion, ear pain, rhinorrhea and sore throat.   Respiratory:  Positive for shortness of breath. Negative for cough.   Cardiovascular:  Negative for chest pain.  Gastrointestinal:  Positive for constipation (Sennekot helps) and nausea. Negative for abdominal pain, diarrhea and vomiting.  Endocrine: Positive for polyphagia. Negative for polydipsia.  Genitourinary:  Negative for dysuria and urgency.  Musculoskeletal:  Positive for arthralgias (Hurts everywhere R hip > L hip. Back pain. Riight knee pain. Neck), back pain, myalgias and neck pain.  Neurological:  Positive for weakness. Negative for dizziness, light-headedness and headaches.  Psychiatric/Behavioral:  Negative for dysphoric mood. The patient is not nervous/anxious.     Objective:  BP (!) 108/58   Pulse 96   Temp (!) 96.9 F (36.1 C)   Resp 18   Ht 4\' 10"  (1.473 m)   Wt 90 lb 9.6 oz (41.1 kg)   BMI 18.94 kg/m   BP/Weight 02/24/2021 11/28/2020 0/04/6760  Systolic BP 950 932 671  Diastolic BP 58 70 60  Wt. (Lbs) 90.6 88 88  BMI 18.94 18.39 18.39    Physical Exam Vitals reviewed.  Constitutional:      Appearance: Normal appearance.     Comments: Thin.   Neck:     Vascular: No carotid bruit.  Cardiovascular:     Rate and Rhythm:  Normal rate and regular rhythm.     Pulses: Normal pulses.     Heart sounds: Normal heart sounds.  Pulmonary:     Effort: Pulmonary effort is normal. No respiratory distress.     Breath sounds: Normal breath sounds.  Abdominal:     General: Abdomen  is flat. Bowel sounds are normal.     Palpations: Abdomen is soft.     Tenderness: There is no abdominal tenderness.  Neurological:     Mental Status: She is alert and oriented to person, place, and time.  Psychiatric:        Mood and Affect: Mood normal.        Behavior: Behavior normal.    Diabetic Foot Exam - Simple   No data filed      Lab Results  Component Value Date   WBC 7.0 02/24/2021   HGB 12.5 02/24/2021   HCT 39.1 02/24/2021   PLT 265 02/24/2021   GLUCOSE 85 02/24/2021   CHOL 216 (H) 02/24/2021   TRIG 186 (H) 02/24/2021   HDL 61 02/24/2021   LDLCALC 123 (H) 02/24/2021   ALT 14 02/24/2021   AST 21 02/24/2021   NA 137 02/24/2021   K 4.9 02/24/2021   CL 100 02/24/2021   CREATININE 1.44 (H) 02/24/2021   BUN 11 02/24/2021   CO2 23 02/24/2021   TSH 8.600 (H) 02/24/2021      Assessment & Plan:   1. Chronic kidney disease, stage 3b ([place]) Recommend hydrate. No oral nsaids.  - CBC with Differential/Platelet - Comprehensive metabolic panel  2. Mixed hyperlipidemia Greatly improved, but not quite at goal.  Recommend continue to work on eating healthy diet and exercise. Continue current otc meds.  - Lipid panel  3. Acquired hypothyroidism Not at goal. I recommended referral to endocrine. Pt refused. She wants to take otc herbal. I do not recommend this unless approved by endocrinology.  - T4, free - TSH  4. Gastroparesis Stable  5. Gastroesophageal reflux disease without esophagitis The current medical regimen is effective;  continue present plan and medications.  6. Moderate protein-calorie malnutrition ([facility])  Recommend protein shakes. Pt has always been tiny.   Meds ordered this encounter   Medications   DISCONTD: EnovaRX-Naproxen 10 % CREA    Sig: Apply 1 application topically 4 (four) times daily as needed (joint pain).    Dispense:  120 g    Refill:  3   DISCONTD: EnovaRX-Naproxen 10 % CREA    Sig: Apply 1 application topically 4 (four) times daily as needed (joint pain).    Dispense:  120 g    Refill:  3    Pt called [facility] and would like generic of above medicine. Pt would prefer tubes in place of jar.   traZODone (DESYREL) 50 MG tablet    Sig: Take 3 tablets (150 mg total) by mouth at bedtime.    Dispense:  270 tablet    Refill:  1    Orders Placed This Encounter  Procedures   CBC with Differential/Platelet   Comprehensive metabolic panel   Lipid panel   T4, free   TSH   Cardiovascular Risk Assessment     Follow-up: Return in about 6 months (around 08/26/2021) for fasting.  An After Visit Summary was printed and given to the patient.  [name], MD [facility] [phone]

## 2021-02-24 ENCOUNTER — Ambulatory Visit (INDEPENDENT_AMBULATORY_CARE_PROVIDER_SITE_OTHER): Payer: Medicare Other | Admitting: Family Medicine

## 2021-02-24 ENCOUNTER — Other Ambulatory Visit: Payer: Self-pay

## 2021-02-24 ENCOUNTER — Encounter: Payer: Self-pay | Admitting: Family Medicine

## 2021-02-24 VITALS — BP 108/58 | HR 96 | Temp 96.9°F | Resp 18 | Ht <= 58 in | Wt 90.6 lb

## 2021-02-24 DIAGNOSIS — I129 Hypertensive chronic kidney disease with stage 1 through stage 4 chronic kidney disease, or unspecified chronic kidney disease: Secondary | ICD-10-CM | POA: Diagnosis not present

## 2021-02-24 DIAGNOSIS — K3184 Gastroparesis: Secondary | ICD-10-CM | POA: Diagnosis not present

## 2021-02-24 DIAGNOSIS — E782 Mixed hyperlipidemia: Secondary | ICD-10-CM

## 2021-02-24 DIAGNOSIS — K219 Gastro-esophageal reflux disease without esophagitis: Secondary | ICD-10-CM | POA: Diagnosis not present

## 2021-02-24 DIAGNOSIS — N1832 Chronic kidney disease, stage 3b: Secondary | ICD-10-CM

## 2021-02-24 DIAGNOSIS — E039 Hypothyroidism, unspecified: Secondary | ICD-10-CM

## 2021-02-24 DIAGNOSIS — E440 Moderate protein-calorie malnutrition: Secondary | ICD-10-CM

## 2021-02-24 DIAGNOSIS — N184 Chronic kidney disease, stage 4 (severe): Secondary | ICD-10-CM | POA: Diagnosis not present

## 2021-02-24 MED ORDER — ENOVARX-NAPROXEN 10 % EX CREA: 1.0000 "application " | TOPICAL_CREAM | Freq: Four times a day (QID) | CUTANEOUS | 3 refills | Status: DC | PRN

## 2021-02-24 MED ORDER — TRAZODONE HCL 50 MG PO TABS: 150.0000 mg | ORAL_TABLET | Freq: Every day | ORAL | 1 refills | Status: DC

## 2021-02-25 LAB — CBC WITH DIFFERENTIAL/PLATELET
Basophils Absolute: 0 10*3/uL (ref 0.0–0.2)
Basos: 0 %
EOS (ABSOLUTE): 0.1 10*3/uL (ref 0.0–0.4)
Eos: 2 %
Hematocrit: 39.1 % (ref 34.0–46.6)
Hemoglobin: 12.5 g/dL (ref 11.1–15.9)
Immature Grans (Abs): 0 10*3/uL (ref 0.0–0.1)
Immature Granulocytes: 0 %
Lymphocytes Absolute: 2.2 10*3/uL (ref 0.7–3.1)
Lymphs: 31 %
MCH: 30 pg (ref 26.6–33.0)
MCHC: 32 g/dL (ref 31.5–35.7)
MCV: 94 fL (ref 79–97)
Monocytes Absolute: 1 10*3/uL — ABNORMAL HIGH (ref 0.1–0.9)
Monocytes: 14 %
Neutrophils Absolute: 3.7 10*3/uL (ref 1.4–7.0)
Neutrophils: 53 %
Platelets: 265 10*3/uL (ref 150–450)
RBC: 4.17 x10E6/uL (ref 3.77–5.28)
RDW: 13.1 % (ref 11.7–15.4)
WBC: 7 10*3/uL (ref 3.4–10.8)

## 2021-02-25 LAB — COMPREHENSIVE METABOLIC PANEL
ALT: 14 IU/L (ref 0–32)
AST: 21 IU/L (ref 0–40)
Albumin/Globulin Ratio: 1.7 (ref 1.2–2.2)
Albumin: 4.5 g/dL (ref 3.6–4.6)
Alkaline Phosphatase: 83 IU/L (ref 44–121)
BUN/Creatinine Ratio: 8 — ABNORMAL LOW (ref 12–28)
BUN: 11 mg/dL (ref 8–27)
Bilirubin Total: 0.4 mg/dL (ref 0.0–1.2)
CO2: 23 mmol/L (ref 20–29)
Calcium: 10 mg/dL (ref 8.7–10.3)
Chloride: 100 mmol/L (ref 96–106)
Creatinine, Ser: 1.44 mg/dL — ABNORMAL HIGH (ref 0.57–1.00)
Globulin, Total: 2.6 g/dL (ref 1.5–4.5)
Glucose: 85 mg/dL (ref 65–99)
Potassium: 4.9 mmol/L (ref 3.5–5.2)
Sodium: 137 mmol/L (ref 134–144)
Total Protein: 7.1 g/dL (ref 6.0–8.5)
eGFR: 36 mL/min/{1.73_m2} — ABNORMAL LOW (ref >59)

## 2021-02-25 LAB — LIPID PANEL
Chol/HDL Ratio: 3.5 ratio (ref 0.0–4.4)
Cholesterol, Total: 216 mg/dL — ABNORMAL HIGH (ref 100–199)
HDL: 61 mg/dL (ref >39)
LDL Chol Calc (NIH): 123 mg/dL — ABNORMAL HIGH (ref 0–99)
Triglycerides: 186 mg/dL — ABNORMAL HIGH (ref 0–149)
VLDL Cholesterol Cal: 32 mg/dL (ref 5–40)

## 2021-02-25 LAB — TSH: TSH: 8.6 u[IU]/mL — ABNORMAL HIGH (ref 0.450–4.500)

## 2021-02-25 LAB — CARDIOVASCULAR RISK ASSESSMENT

## 2021-02-25 LAB — T4, FREE: Free T4: 1.07 ng/dL (ref 0.82–1.77)

## 2021-02-28 ENCOUNTER — Other Ambulatory Visit: Payer: Self-pay

## 2021-02-28 ENCOUNTER — Telehealth: Payer: Self-pay

## 2021-02-28 MED ORDER — ENOVARX-NAPROXEN 10 % EX CREA
1.0000 | TOPICAL_CREAM | Freq: Four times a day (QID) | CUTANEOUS | 3 refills | Status: DC | PRN
Start: 2021-02-28 — End: 2022-12-20

## 2021-02-28 NOTE — Telephone Encounter (Signed)
Needs change in pharmacy. [place], [place] 02/28/21 11:27 AM

## 2021-02-28 NOTE — Progress Notes (Signed)
Chronic Care Management Pharmacy Assistant   Name: Valerie Kelley  MRN: [id number] DOB: 1934/07/04  Reason for Encounter: Disease State for hypertension  Recent office visits:  02/24/21-Dr Cox PCP, hypertensive kidney disease, Labs, start Naproxen cream, Trazodone 150mg  at bedtime,  Blood count normal. Liver function normal.  Kidney function abnormal. Stable. Thyroid function abnormal. Start on synthroid 50 mcg once daily in am. Recheck in 3 months.  Cholesterol: LDL dropped nearly in half. Dropped from 224 to 123. Trigs increased from 129 to 186. Please ask her what she did to improve her LDL. I do not see any medicines which would have dropped her LDL by nearly 100.  Patient refused endocrinology referral.     01/17/21-Valerie Kelley CPP, Patient declines second COVID booster due to how sick the last shot made her. She is also unable to complete Shingrix series due to vaccine made her feel really bad. Patient reports she is very sensitive to medications and has a lot of problems with her stomach.  Patient declines Nexletol or Repatha as an option. She has been unable to tolerate Zetia or statins in the past. She has to be careful eating fruits and vegetables due to her stomach as well. Please consider updating diagnosis code to reflect statin intolerance due to muscle pain. Patient has begun additional supplements for cholesterol.  Patient is not interested in Prolia at this time. Not an ideal candidate for alendronate due to stomach issues. She cannot take supplemental calcium due to nephrologist.   Recent consult visits:  none  Hospital visits:  None in previous 6 months  Medications: Outpatient Encounter Medications as of 02/28/2021  Medication Sig  . ACETAMINOPHEN PO Take 650 mg by mouth in the morning and at bedtime. For pain p  . albuterol (PROVENTIL HFA;VENTOLIN HFA) 108 (90 BASE) MCG/ACT inhaler Inhale 2 puffs into the lungs every 6 (six) hours as needed for wheezing. For wheezing  .  b complex vitamins capsule Take 1 capsule by mouth daily.  . budesonide-formoterol (SYMBICORT) 160-4.5 MCG/ACT inhaler Inhale 1 puff into the lungs daily as needed.  . Cholecalciferol (VITAMIN D3) 125 MCG (5000 UT) TBDP Take 5,000 Units by mouth daily.  . cimetidine (TAGAMET) 300 MG tablet Take 300 mg by mouth daily.  Marland Kitchen co-enzyme Q-10 30 MG capsule Take 30 mg by mouth daily.  . DULoxetine (CYMBALTA) 20 MG capsule TAKE 1 CAPSULE BY MOUTH TWICE A DAY (Patient taking differently: Take 20 mg by mouth daily.)  . EnovaRX-Naproxen 10 % CREA Apply 1 application topically 4 (four) times daily as needed (joint pain).  . fish oil-omega-3 fatty acids 1000 MG capsule Take 2 capsules by mouth daily. Patient using wild alaskan salmon product with 2400 mg  . Flaxseed Oil OIL by Does not apply route daily.  . Glucosamine Sulfate 1000 MG CAPS Take 1 capsule by mouth daily.  . Lecithin GRAN by Does not apply route daily.  Marland Kitchen loratadine (CLARITIN) 10 MG tablet Take 10 mg by mouth daily as needed.  . Multiple Vitamins-Minerals (ALIVE WOMENS 50+) TABS Take 1 tablet by mouth daily at 12 noon.  . polyethylene glycol (MIRALAX / GLYCOLAX) packet Take 51 g by mouth daily as needed. Takes 3 capfuls daily  . senna (SENOKOT) 8.6 MG tablet Take 1 tablet by mouth daily.  Marland Kitchen tiotropium (SPIRIVA) 18 MCG inhalation capsule Place 18 mcg into inhaler and inhale daily as needed.  . traZODone (DESYREL) 50 MG tablet Take 3 tablets (150 mg total) by mouth  at bedtime.  . TURMERIC PO Take 1 capsule by mouth daily.  . vitamin E 400 UNIT capsule Take 400 Units by mouth daily.   No facility-administered encounter medications on file as of 02/28/2021.     Recent Office Vitals: BP Readings from Last 3 Encounters:  02/24/21 (!) 108/58  11/28/20 134/70  10/06/20 110/60   Pulse Readings from Last 3 Encounters:  02/24/21 96  11/28/20 73  10/06/20 80    Wt Readings from Last 3 Encounters:  02/24/21 90 lb 9.6 oz (41.1 kg)  11/28/20 88  lb (39.9 kg)  10/06/20 88 lb (39.9 kg)     Kidney Function Lab Results  Component Value Date/Time   CREATININE 1.44 (H) 02/24/2021 08:42 AM   CREATININE 1.22 (H) 10/06/2020 11:58 AM   GFRNONAA 41 (L) 10/06/2020 11:58 AM   GFRAA 47 (L) 10/06/2020 11:58 AM    BMP Latest Ref Rng & Units 02/24/2021 10/06/2020 08/25/2020  Glucose 65 - 99 mg/dL 85 86 96  BUN 8 - 27 mg/dL 11 13 17   Creatinine 0.57 - 1.00 mg/dL 1.44(H) 1.22(H) 1.69(H)  BUN/Creat Ratio 12 - 28 8(L) 11(L) 10(L)  Sodium 134 - 144 mmol/L 137 138 141  Potassium 3.5 - 5.2 mmol/L 4.9 4.3 3.9  Chloride 96 - 106 mmol/L 100 101 102  CO2 20 - 29 mmol/L 23 24 18(L)  Calcium 8.7 - 10.3 mg/dL 10.0 9.4 10.0     . Current antihypertensive regimen:  Diet/lifestyle   . How often are you checking your Blood Pressure? infrequently, patient stated she really doesn't check it  . OTC medications including pseudoephedrine or NSAIDs? Patient does not use dur to kidney disease  . What recent interventions/DTPs have been made by any provider to improve Blood Pressure control since last CPP Visit: Patient stated she has been taking 1 tablespoon of flaxseed oil daily, she contributes this to her LDL levels improving.  She stated that she started back on her 25mg  Synthroid on 02/27/21, for now she is only taking 1/2 tablet.  She stated her Aleve cream needs to be sent to [facility], I told her I would tell Valerie Kelley, CPP.    Marland Kitchen What diet changes have been made to improve Blood Pressure Control?  Patient does not add salt.  She wanted to know what food to avoid for her thyroid.   . What exercise is being done to improve your Blood Pressure Control?  Patient stated she stays active, she said her bones hurt, and she is worried the Synthroid will make them worse, but is willing to try again.   Adherence Review: Is the patient currently on ACE/ARB medication? No Does the patient have >5 day gap between last estimated fill dates? CPP to review   Star  Rating Drugs:  Medication:  Last Fill: Day Supply None listed  Valerie Kelley, [place] Pharmacist Assistant [phone]

## 2021-02-28 NOTE — Telephone Encounter (Signed)
[facility] calling states they do not have access to this. Pharmacist did Government social research officer and did not know how accessible this would be to any pharmacy.   Royce Macadamia, [place] 02/28/21 2:13 PM

## 2021-03-14 DIAGNOSIS — M5416 Radiculopathy, lumbar region: Secondary | ICD-10-CM | POA: Diagnosis not present

## 2021-03-23 ENCOUNTER — Other Ambulatory Visit: Payer: Self-pay

## 2021-03-23 DIAGNOSIS — E039 Hypothyroidism, unspecified: Secondary | ICD-10-CM

## 2021-03-29 ENCOUNTER — Ambulatory Visit (INDEPENDENT_AMBULATORY_CARE_PROVIDER_SITE_OTHER): Payer: Medicare Other

## 2021-03-29 ENCOUNTER — Other Ambulatory Visit: Payer: Self-pay

## 2021-03-29 VITALS — BP 108/52 | HR 62 | Resp 16 | Ht <= 58 in | Wt 92.2 lb

## 2021-03-29 DIAGNOSIS — Z0000 Encounter for general adult medical examination without abnormal findings: Secondary | ICD-10-CM

## 2021-03-29 DIAGNOSIS — Z1231 Encounter for screening mammogram for malignant neoplasm of breast: Secondary | ICD-10-CM

## 2021-03-29 DIAGNOSIS — Z681 Body mass index (BMI) 19 or less, adult: Secondary | ICD-10-CM | POA: Diagnosis not present

## 2021-03-29 DIAGNOSIS — Z20822 Contact with and (suspected) exposure to covid-19: Secondary | ICD-10-CM | POA: Diagnosis not present

## 2021-03-29 NOTE — Progress Notes (Signed)
Subjective:   Valerie Kelley is a 87 y.o. female who presents for Medicare Annual (Subsequent) preventive examination.  This wellness visit is conducted by a nurse.  The patient's medications were reviewed and reconciled since the patient's last visit.  History details were provided by the patient.  The history appears to be reliable.    Patient's last AWV was one year ago.   Medical History: Patient history and Family history was reviewed  Medications, Allergies, and preventative health maintenance was reviewed and updated.   Review of Systems    Review of Systems  Constitutional:  Positive for fatigue.  HENT: Negative.    Eyes: Negative.   Respiratory: Negative.  Negative for apnea, cough and shortness of breath.   Cardiovascular: Negative.  Negative for chest pain and palpitations.  Musculoskeletal:  Positive for arthralgias and neck pain.  Neurological:  Positive for weakness and numbness.       Numbness in hands   Psychiatric/Behavioral: Negative.  Negative for confusion, dysphoric mood and suicidal ideas.    Cardiac Risk Factors include: advanced age (>74men, >60 women);dyslipidemia     Objective:    Today's Vitals   03/29/21 0906  BP: (!) 108/52  Pulse: 62  Resp: 16  Weight: 92 lb 3.2 oz (41.8 kg)  Height: 4\' 10"  (1.473 m)  PainSc: 2    Body mass index is 19.27 kg/m.  Advanced Directives 03/29/2021 03/22/2020 04/15/2013 04/09/2013  Does Patient Have a Medical Advance Directive? Yes Yes Patient has advance directive, copy not in chart Patient has advance directive, copy not in chart  Type of Advance Directive Healthcare Power of [place];Living will - -  Does patient want to make changes to medical advance directive? No - Patient declined No - Patient declined - -  Copy of [place] in Chart? No - copy requested No - copy requested Copy requested from other (Comment) -    Current Medications (verified) Outpatient  Encounter Medications as of 03/29/2021  Medication Sig   ACETAMINOPHEN PO Take 650 mg by mouth in the morning and at bedtime. For pain p   albuterol (PROVENTIL HFA;VENTOLIN HFA) 108 (90 BASE) MCG/ACT inhaler Inhale 2 puffs into the lungs every 6 (six) hours as needed for wheezing. For wheezing   b complex vitamins capsule Take 1 capsule by mouth daily.   budesonide-formoterol (SYMBICORT) 160-4.5 MCG/ACT inhaler Inhale 1 puff into the lungs daily as needed.   Cholecalciferol (VITAMIN D3) 125 MCG (5000 UT) TBDP Take 5,000 Units by mouth daily.   cimetidine (TAGAMET) 300 MG tablet Take 300 mg by mouth daily.   co-enzyme Q-10 30 MG capsule Take 30 mg by mouth daily.   DULoxetine (CYMBALTA) 20 MG capsule TAKE 1 CAPSULE BY MOUTH TWICE A DAY (Patient taking differently: Take 20 mg by mouth daily.)   EnovaRX-Naproxen 10 % CREA Apply 1 application topically 4 (four) times daily as needed (joint pain).   fish oil-omega-3 fatty acids 1000 MG capsule Take 2 capsules by mouth daily. Patient using wild alaskan salmon product with 2400 mg   Flaxseed Oil OIL by Does not apply route daily.   Glucosamine Sulfate 1000 MG CAPS Take 1 capsule by mouth daily.   Lecithin GRAN by Does not apply route daily.   levothyroxine (SYNTHROID) 50 MCG tablet Take 50 mcg by mouth daily before breakfast.   loratadine (CLARITIN) 10 MG tablet Take 10 mg by mouth daily as needed.   Multiple Vitamins-Minerals (ALIVE WOMENS 50+)  TABS Take 1 tablet by mouth daily at 12 noon.   polyethylene glycol (MIRALAX / GLYCOLAX) packet Take 51 g by mouth daily as needed. Takes 3 capfuls daily   senna (SENOKOT) 8.6 MG tablet Take 1 tablet by mouth daily.   tiotropium (SPIRIVA) 18 MCG inhalation capsule Place 18 mcg into inhaler and inhale daily as needed.   traZODone (DESYREL) 50 MG tablet Take 3 tablets (150 mg total) by mouth at bedtime.   TURMERIC PO Take 1 capsule by mouth daily.   vitamin E 400 UNIT capsule Take 400 Units by mouth daily.   No  facility-administered encounter medications on file as of 03/29/2021.    Allergies (verified) Codeine, Darvocet [propoxyphene n-acetaminophen], Glucosamine, Other, Prednisone, Pregabalin, Shellfish allergy, Statins, Welchol [colesevelam], Zetia [ezetimibe], and Penicillins   History: Past Medical History:  Diagnosis Date   Arthritis    "all over" (04/15/2013)   Asthma    Emphysema    "mild" (04/15/2013)   Exertional shortness of breath    Gastric paresis    GERD (gastroesophageal reflux disease)    H/O hiatal hernia    High cholesterol    Hypothyroidism    Myalgia due to statin    Stage III chronic kidney disease ([place])    Past Surgical History:  Procedure Laterality Date   ABDOMINAL HYSTERECTOMY     REVERSE SHOULDER ARTHROPLASTY Left 04/15/2013   REVERSE SHOULDER ARTHROPLASTY Left 04/15/2013   Procedure: REVERSE SHOULDER ARTHROPLASTY LEFT;  Surgeon: Augustin Schooling, MD;  Location: [place];  Service: Orthopedics;  Laterality: Left;   SINUS EXPLORATION     TONSILLECTOMY     Family History  Problem Relation Age of Onset   Stroke Mother    Cancer Brother        brain   Cancer Brother        leukemia   Cancer Brother        lung   Social History   Socioeconomic History   Marital status: Widowed   Number of children: 2  Occupational History   Occupation: Retired  Tobacco Use   Smoking status: Never   Smokeless tobacco: Never  Vaping Use   Vaping Use: Never used  Substance and Sexual Activity   Alcohol use: No   Drug use: No   Sexual activity: Not Currently   Social Determinants of Health   Financial Resource Strain: Not on file  Food Insecurity: No Food Insecurity   Worried About Charity fundraiser in the Last Year: Never true   Ran Out of Food in the Last Year: Never true  Transportation Needs: No Transportation Needs   Lack of Transportation (Medical): No   Lack of Transportation (Non-Medical): No  Physical Activity: Insufficiently Active   Days of Exercise  per Week: 5 days   Minutes of Exercise per Session: 10 min  Stress: Not on file  Social Connections: Not on file   Tobacco Counseling Counseling given: Not Answered  Clinical Intake: Pre-visit preparation completed: Yes Pain : 0-10 Pain Score: 2  Pain Type: Chronic pain Pain Location: Neck Pain Frequency: Constant Pain Relieving Factors: creams Effect of Pain on Daily Activities: moderate Pain Relieving Factors: creams BMI - recorded: 19.27 Nutritional Status: BMI of 19-24  Normal Nutritional Risks: unhealthy diet due to gastro problems Diabetes: No How often do you need to have someone help you when you read instructions, pamphlets, or other written materials from your doctor or pharmacy?: 2 - Rarely Interpreter Needed?: No   Activities  of Daily Living In your present state of health, do you have any difficulty performing the following activities: 03/29/2021  Hearing? N  Vision? N  Difficulty concentrating or making decisions? N  Walking or climbing stairs? N  Dressing or bathing? N  Doing errands, shopping? N  Preparing Food and eating ? N  Using the Toilet? N  In the past six months, have you accidently leaked urine? N  Do you have problems with loss of bowel control? N  Managing your Medications? N  Managing your Finances? N  Housekeeping or managing your Housekeeping? N  Some recent data might be hidden    Patient Care Team: [name], MD as PCP - General (Family Medicine) Scherrie November, MD as Referring Physician (Gastroenterology) Kyung Rudd., OD (Optometry) Suella Broad, MD as Consulting Physician (Physical Medicine and Rehabilitation) Gardiner Rhyme, MD as Referring Physician (Specialist) Thereasa Distance, MD as Referring Physician (Nephrology) Burnice Logan, [facility] as Pharmacist (Pharmacist)    Assessment:   This is a routine wellness examination for Valerie Kelley.  Dietary issues and exercise activities discussed: Current Exercise Habits: The  patient does not participate in regular exercise at present   Goals Addressed   None    Depression Screen PHQ 2/9 Scores 03/29/2021 02/27/2021 02/24/2021 11/28/2020 10/06/2020 08/25/2020 03/22/2020  PHQ - 2 Score 0 0 0 0 0 0 0  PHQ- 9 Score 3 3 - 1 - - -    Fall Risk Fall Risk  03/29/2021 02/24/2021 11/28/2020 10/06/2020 08/25/2020  Falls in the past year? 0 0 1 0 0  Comment - - - - -  Number falls in past yr: 0 0 0 0 0  Injury with Fall? 0 0 0 0 -  Risk for fall due to : Other (Comment) - Other (Comment) - -  Risk for fall due to: Comment weakness - Moving a flower pot and tripped over a root in the yard. - -  Follow up Falls evaluation completed;Education provided;Falls prevention discussed - Falls prevention discussed - -    FALL RISK PREVENTION PERTAINING TO THE HOME:  Any stairs in or around the home? Yes  If so, are there any without handrails? No  Home free of loose throw rugs in walkways, pet beds, electrical cords, etc? Yes  Adequate lighting in your home to reduce risk of falls? Yes   ASSISTIVE DEVICES UTILIZED TO PREVENT FALLS:  Use of a cane, walker or w/c? No  Grab bars in the bathroom? No  Shower chair or bench in shower? No  Elevated toilet seat or a handicapped toilet? No  Gait slow and steady without use of assistive device  Cognitive Function:     6CIT Screen 03/29/2021 03/22/2020  What Year? 0 points 0 points  What month? 0 points 0 points  What time? 0 points 0 points  Count back from 20 0 points 0 points  Months in reverse 2 points 2 points  Repeat phrase 0 points 2 points  Total Score 2 4    Immunizations Immunization History  Administered Date(s) Administered   Influenza Split 06/05/2017, 06/05/2019, 06/01/2020   Influenza-Unspecified 06/05/2017   PFIZER(Purple Top)SARS-COV-2 Vaccination 12/04/2019, 12/30/2019, 07/08/2020   Pneumococcal Conjugate-13 12/06/2014   Pneumococcal Polysaccharide-23 07/23/1998, 03/22/2020   Tdap 07/30/2012   Zoster Recombinat  (Shingrix) 12/15/2020    TDAP status: Up to date  Flu Vaccine status: Up to date  Pneumococcal vaccine status: Up to date  Covid-19 vaccine status: Declined, Education has been provided regarding the importance  of this vaccine but patient still declined. Advised may receive this vaccine at local pharmacy or Health Dept.or vaccine clinic. Aware to provide a copy of the vaccination record if obtained from local pharmacy or Health Dept. Verbalized acceptance and understanding.  Qualifies for Shingles Vaccine? Yes   Zostavax completed No   Shingrix Completed?: No.    Education has been provided regarding the importance of this vaccine. Patient has been advised to call insurance company to determine out of pocket expense if they have not yet received this vaccine. Advised may also receive vaccine at local pharmacy or Health Dept. Verbalized acceptance and understanding.  Screening Tests Health Maintenance  Topic Date Due   COVID-19 Vaccine (4 - Booster for Pfizer series) 11/08/2020   Zoster Vaccines- Shingrix (2 of 2) 02/09/2021   INFLUENZA VACCINE  04/24/2021   TETANUS/TDAP  07/30/2022   DEXA SCAN  Completed   PNA vac Low Risk Adult  Completed   HPV VACCINES  Aged Out    Health Maintenance  Health Maintenance Due  Topic Date Due   COVID-19 Vaccine (4 - Booster for Pfizer series) 11/08/2020   Zoster Vaccines- Shingrix (2 of 2) 02/09/2021    Colorectal cancer screening: Type of screening: Colonoscopy. Completed 08/2016. Repeat every 10 years  Mammogram status: Completed 04/05/20. Repeat every year  Bone Density status: Completed 04/05/20. Results reflect: Bone density results: OSTEOPOROSIS. Repeat every 2 years.  Lung Cancer Screening: (Low Dose CT Chest recommended if Age 4-80 years, 30 pack-year currently smoking OR have quit w/in 15years.) does not qualify.   Additional Screening:  Vision Screening: Recommended annual ophthalmology exams for early detection of glaucoma and  other disorders of the eye. Is the patient up to date with their annual eye exam?  Yes  Who is the provider or what is the name of the office in which the patient attends annual eye exams? [facility]  Dental Screening: Recommended annual dental exams for proper oral hygiene    Plan:    1- Mammogram - ordered, due after 04/05/21 2- COVID Vaccine - booster due, patient declined because it makes her feel bad 3- Shingrix Vaccine - does not want the second dose because it makes her feel bad 4- Advance Directive - requested copy for file 5- Patient does not eat a healthy diet due to gastro problems.  She sometimes eats chicken once a week, eats a lot of potatoes and oatmeal.  Snacks include cookies, cake, apple, and pecans.  Recommended she supplement with Boost or Ensure daily. 6- Recommend yearly flu vaccine in the fall  I have personally reviewed and noted the following in the patient's chart:   Medical and social history Use of alcohol, tobacco or illicit drugs  Current medications and supplements including opioid prescriptions.  Functional ability and status Nutritional status Physical activity Advanced directives List of other physicians Hospitalizations, surgeries, and ER visits in previous 12 months Vitals Screenings to include cognitive, depression, and falls Referrals and appointments  In addition, I have reviewed and discussed with patient certain preventive protocols, quality metrics, and best practice recommendations. A written personalized care plan for preventive services as well as general preventive health recommendations were provided to patient.     Erie Noe, LPN   02/23/3761

## 2021-03-29 NOTE — Patient Instructions (Signed)
Fall Prevention in the Home, Adult Falls can cause injuries and can happen to people of all ages. There are many things you can do to make your home safe and to help prevent falls. Ask forhelp when making these changes. What actions can I take to prevent falls? General Instructions Use good lighting in all rooms. Replace any light bulbs that burn out. Turn on the lights in dark areas. Use night-lights. Keep items that you use often in easy-to-reach places. Lower the shelves around your home if needed. Set up your furniture so you have a clear path. Avoid moving your furniture around. Do not have throw rugs or other things on the floor that can make you trip. Avoid walking on wet floors. If any of your floors are uneven, fix them. Add color or contrast paint or tape to clearly mark and help you see: Grab bars or handrails. First and last steps of staircases. Where the edge of each step is. If you use a stepladder: Make sure that it is fully opened. Do not climb a closed stepladder. Make sure the sides of the stepladder are locked in place. Ask someone to hold the stepladder while you use it. Know where your pets are when moving through your home. What can I do in the bathroom?     Keep the floor dry. Clean up any water on the floor right away. Remove soap buildup in the tub or shower. Use nonskid mats or decals on the floor of the tub or shower. Attach bath mats securely with double-sided, nonslip rug tape. If you need to sit down in the shower, use a plastic, nonslip stool. Install grab bars by the toilet and in the tub and shower. Do not use towel bars as grab bars. What can I do in the bedroom? Make sure that you have a light by your bed that is easy to reach. Do not use any sheets or blankets for your bed that hang to the floor. Have a firm chair with side arms that you can use for support when you get dressed. What can I do in the kitchen? Clean up any spills right away. If  you need to reach something above you, use a step stool with a grab bar. Keep electrical cords out of the way. Do not use floor polish or wax that makes floors slippery. What can I do with my stairs? Do not leave any items on the stairs. Make sure that you have a light switch at the top and the bottom of the stairs. Make sure that there are handrails on both sides of the stairs. Fix handrails that are broken or loose. Install nonslip stair treads on all your stairs. Avoid having throw rugs at the top or bottom of the stairs. Choose a carpet that does not hide the edge of the steps on the stairs. Check carpeting to make sure that it is firmly attached to the stairs. Fix carpet that is loose or worn. What can I do on the outside of my home? Use bright outdoor lighting. Fix the edges of walkways and driveways and fix any cracks. Remove anything that might make you trip as you walk through a door, such as a raised step or threshold. Trim any bushes or trees on paths to your home. Check to see if handrails are loose or broken and that both sides of all steps have handrails. Install guardrails along the edges of any raised decks and porches. Clear paths of anything that   can make you trip, such as tools or rocks. Have leaves, snow, or ice cleared regularly. Use sand or salt on paths during winter. Clean up any spills in your garage right away. This includes grease or oil spills. What other actions can I take? Wear shoes that: Have a low heel. Do not wear high heels. Have rubber bottoms. Feel good on your feet and fit well. Are closed at the toe. Do not wear open-toe sandals. Use tools that help you move around if needed. These include: Canes. Walkers. Scooters. Crutches. Review your medicines with your doctor. Some medicines can make you feel dizzy. This can increase your chance of falling. Ask your doctor what else you can do to help prevent falls. Where to find more information Centers  for Disease Control and Prevention, [employer]: www.cdc.gov National Institute on Aging: www.nia.nih.gov Contact a doctor if: You are afraid of falling at home. You feel weak, drowsy, or dizzy at home. You fall at home. Summary There are many simple things that you can do to make your home safe and to help prevent falls. Ways to make your home safe include removing things that can make you trip and installing grab bars in the bathroom. Ask for help when making these changes in your home. This information is not intended to replace advice given to you by your health care provider. Make sure you discuss any questions you have with your healthcare provider. Document Revised: 04/13/2020 Document Reviewed: 04/13/2020 Elsevier Patient Education  2022 Elsevier Inc.  Health Maintenance, Female Adopting a healthy lifestyle and getting preventive care are important in promoting health and wellness. Ask your health care provider about: The right schedule for you to have regular tests and exams. Things you can do on your own to prevent diseases and keep yourself healthy. What should I know about diet, weight, and exercise? Eat a healthy diet  Eat a diet that includes plenty of vegetables, fruits, low-fat dairy products, and lean protein. Do not eat a lot of foods that are high in solid fats, added sugars, or sodium.  Maintain a healthy weight Body mass index (BMI) is used to identify weight problems. It estimates body fat based on height and weight. Your health care provider can help determineyour BMI and help you achieve or maintain a healthy weight. Get regular exercise Get regular exercise. This is one of the most important things you can do for your health. Most adults should: Exercise for at least 150 minutes each week. The exercise should increase your heart rate and make you sweat (moderate-intensity exercise). Do strengthening exercises at least twice a week. This is in addition to the  moderate-intensity exercise. Spend less time sitting. Even light physical activity can be beneficial. Watch cholesterol and blood lipids Have your blood tested for lipids and cholesterol at 85 years of age, then havethis test every 5 years. Have your cholesterol levels checked more often if: Your lipid or cholesterol levels are high. You are older than 85 years of age. You are at high risk for heart disease. What should I know about cancer screening? Depending on your health history and family history, you may need to have cancer screening at various ages. This may include screening for: Breast cancer. Cervical cancer. Colorectal cancer. Skin cancer. Lung cancer. What should I know about heart disease, diabetes, and high blood pressure? Blood pressure and heart disease High blood pressure causes heart disease and increases the risk of stroke. This is more likely to develop in people   who have high blood pressure readings, are of African descent, or are overweight. Have your blood pressure checked: Every 3-5 years if you are 18-39 years of age. Every year if you are 40 years old or older. Diabetes Have regular diabetes screenings. This checks your fasting blood sugar level. Have the screening done: Once every three years after age 40 if you are at a normal weight and have a low risk for diabetes. More often and at a younger age if you are overweight or have a high risk for diabetes. What should I know about preventing infection? Hepatitis B If you have a higher risk for hepatitis B, you should be screened for this virus. Talk with your health care provider to find out if you are at risk forhepatitis B infection. Hepatitis C Testing is recommended for: Everyone born from 1945 through 1965. Anyone with known risk factors for hepatitis C. Sexually transmitted infections (STIs) Get screened for STIs, including gonorrhea and chlamydia, if: You are sexually active and are younger than 85  years of age. You are older than 85 years of age and your health care provider tells you that you are at risk for this type of infection. Your sexual activity has changed since you were last screened, and you are at increased risk for chlamydia or gonorrhea. Ask your health care provider if you are at risk. Ask your health care provider about whether you are at high risk for HIV. Your health care provider may recommend a prescription medicine to help prevent HIV infection. If you choose to take medicine to prevent HIV, you should first get tested for HIV. You should then be tested every 3 months for as long as you are taking the medicine. Pregnancy If you are about to stop having your period (premenopausal) and you may become pregnant, seek counseling before you get pregnant. Take 400 to 800 micrograms (mcg) of folic acid every day if you become pregnant. Ask for birth control (contraception) if you want to prevent pregnancy. Osteoporosis and menopause Osteoporosis is a disease in which the bones lose minerals and strength with aging. This can result in bone fractures. If you are 65 years old or older, or if you are at risk for osteoporosis and fractures, ask your health care provider if you should: Be screened for bone loss. Take a calcium or vitamin D supplement to lower your risk of fractures. Be given hormone replacement therapy (HRT) to treat symptoms of menopause. Follow these instructions at home: Lifestyle Do not use any products that contain nicotine or tobacco, such as cigarettes, e-cigarettes, and chewing tobacco. If you need help quitting, ask your health care provider. Do not use street drugs. Do not share needles. Ask your health care provider for help if you need support or information about quitting drugs. Alcohol use Do not drink alcohol if: Your health care provider tells you not to drink. You are pregnant, may be pregnant, or are planning to become pregnant. If you drink  alcohol: Limit how much you use to 0-1 drink a day. Limit intake if you are breastfeeding. Be aware of how much alcohol is in your drink. In the [place] one drink equals one 12 oz bottle of beer (355 mL), one 5 oz glass of wine (148 mL), or one 1 oz glass of hard liquor (44 mL). General instructions Schedule regular health, dental, and eye exams. Stay current with your vaccines. Tell your health care provider if: You often feel depressed. You have ever been   abused or do not feel safe at home. Summary Adopting a healthy lifestyle and getting preventive care are important in promoting health and wellness. Follow your health care provider's instructions about healthy diet, exercising, and getting tested or screened for diseases. Follow your health care provider's instructions on monitoring your cholesterol and blood pressure. This information is not intended to replace advice given to you by your health care provider. Make sure you discuss any questions you have with your healthcare provider. Document Revised: 09/03/2018 Document Reviewed: 09/03/2018 Elsevier Patient Education  2022 Elsevier Inc.  

## 2021-03-31 ENCOUNTER — Other Ambulatory Visit: Payer: Self-pay | Admitting: Legal Medicine

## 2021-04-04 ENCOUNTER — Telehealth: Payer: Self-pay | Admitting: Family Medicine

## 2021-04-04 NOTE — Telephone Encounter (Signed)
   Valerie Kelley has been scheduled for the following appointment:  WHAT: SCREENING MAMMOGRAM WHERE: [place]: 04/21/21 TIME: 2:30PM ARRIVAL TIME  Patient has been made aware.

## 2021-04-10 DIAGNOSIS — M25561 Pain in right knee: Secondary | ICD-10-CM | POA: Diagnosis not present

## 2021-04-10 DIAGNOSIS — M1711 Unilateral primary osteoarthritis, right knee: Secondary | ICD-10-CM | POA: Diagnosis not present

## 2021-04-17 ENCOUNTER — Ambulatory Visit: Payer: Medicare Other | Admitting: Physician Assistant

## 2021-04-18 ENCOUNTER — Encounter: Payer: Self-pay | Admitting: Physician Assistant

## 2021-04-18 ENCOUNTER — Ambulatory Visit (INDEPENDENT_AMBULATORY_CARE_PROVIDER_SITE_OTHER): Payer: Medicare Other | Admitting: Physician Assistant

## 2021-04-18 VITALS — BP 106/60 | HR 73 | Temp 97.6°F | Ht <= 58 in | Wt 91.2 lb

## 2021-04-18 DIAGNOSIS — R531 Weakness: Secondary | ICD-10-CM

## 2021-04-18 LAB — POC COVID19 BINAXNOW: SARS Coronavirus 2 Ag: NEGATIVE

## 2021-04-18 NOTE — Progress Notes (Signed)
Subjective:  Patient ID: Valerie Kelley, female    DOB: September 08, 1934  Age: 87 y.o. MRN: [id number]  Chief Complaint  Patient presents with   Arthritis   Fatigue    HPI  Pt in today mainly for generalized weakness - she was initially tested for COVID before coming in office and it was negative She denies chest pain, shortness of  breath Appetite has been normal for her After further discussion she is convinced it is her thyroid medication causing her to feel bad  Pt has discussed with Dr Tobie Poet in the past - about 6-7 months ago was put on synthroid 28mcg qd for newly diagnosed hypothyroidism.  She actually states she stopped that medication a few weeks later because it 'made her bones hurt' After stopping the medication she continued to have pain however because she does have severe osteoarthritis and osteoporosis She had labwork one month ago and was told to actually take synthroid 35mcg qd and she had refused to do that and per lab note refused to see endocrinologist for further evaluation She however on her own restarted the 21mcg qd since last labwork about a month ago and feels that made her weak and hurt worse and stopped the medication 3 days ago and says overall now she feels better She mentioned having an appt with endocrinology in a month??  Her main reason for making appt today she states is to be told she can stop the thyroid medication Current Outpatient Medications on File Prior to Visit  Medication Sig Dispense Refill   ACETAMINOPHEN PO Take 650 mg by mouth in the morning and at bedtime. For pain p     albuterol (PROVENTIL HFA;VENTOLIN HFA) 108 (90 BASE) MCG/ACT inhaler Inhale 2 puffs into the lungs every 6 (six) hours as needed for wheezing. For wheezing     b complex vitamins capsule Take 1 capsule by mouth daily.     budesonide-formoterol (SYMBICORT) 160-4.5 MCG/ACT inhaler Inhale 1 puff into the lungs daily as needed.     Cholecalciferol (VITAMIN D3) 125 MCG (5000 UT)  TBDP Take 5,000 Units by mouth daily.     cimetidine (TAGAMET) 300 MG tablet Take 300 mg by mouth daily.     co-enzyme Q-10 30 MG capsule Take 30 mg by mouth daily.     DULoxetine (CYMBALTA) 20 MG capsule TAKE 1 CAPSULE BY MOUTH TWICE A DAY (Patient taking differently: Take 20 mg by mouth daily.) 180 capsule 1   EnovaRX-Naproxen 10 % CREA Apply 1 application topically 4 (four) times daily as needed (joint pain). 120 g 3   fish oil-omega-3 fatty acids 1000 MG capsule Take 2 capsules by mouth daily. Patient using wild alaskan salmon product with 2400 mg     Flaxseed Oil OIL by Does not apply route daily.     Glucosamine Sulfate 1000 MG CAPS Take 1 capsule by mouth daily.     Lecithin GRAN by Does not apply route daily.     levothyroxine (SYNTHROID) 25 MCG tablet TAKE 1 TABLET BY MOUTH EVERY DAY BEFORE BREAKFAST 90 tablet 1   loratadine (CLARITIN) 10 MG tablet Take 10 mg by mouth daily as needed.     Multiple Vitamins-Minerals (ALIVE WOMENS 50+) TABS Take 1 tablet by mouth daily at 12 noon.     polyethylene glycol (MIRALAX / GLYCOLAX) packet Take 51 g by mouth daily as needed. Takes 3 capfuls daily     senna (SENOKOT) 8.6 MG tablet Take 1 tablet by mouth daily.  tiotropium (SPIRIVA) 18 MCG inhalation capsule Place 18 mcg into inhaler and inhale daily as needed.     traZODone (DESYREL) 50 MG tablet Take 3 tablets (150 mg total) by mouth at bedtime. 270 tablet 1   TURMERIC PO Take 1 capsule by mouth daily.     vitamin E 400 UNIT capsule Take 400 Units by mouth daily.     No current facility-administered medications on file prior to visit.   Past Medical History:  Diagnosis Date   Arthritis    "all over" (04/15/2013)   Asthma    Emphysema    "mild" (04/15/2013)   Exertional shortness of breath    Gastric paresis    GERD (gastroesophageal reflux disease)    H/O hiatal hernia    High cholesterol    Hypothyroidism    Myalgia due to statin    Stage III chronic kidney disease ([place])    Past  Surgical History:  Procedure Laterality Date   ABDOMINAL HYSTERECTOMY     REVERSE SHOULDER ARTHROPLASTY Left 04/15/2013   REVERSE SHOULDER ARTHROPLASTY Left 04/15/2013   Procedure: REVERSE SHOULDER ARTHROPLASTY LEFT;  Surgeon: Augustin Schooling, MD;  Location: [facility];  Service: Orthopedics;  Laterality: Left;   SINUS EXPLORATION     TONSILLECTOMY      Family History  Problem Relation Age of Onset   Stroke Mother    Cancer Brother        brain   Cancer Brother        leukemia   Cancer Brother        lung   Social History   Socioeconomic History   Marital status: Widowed    Spouse name: Not on file   Number of children: 2   Years of education: Not on file   Highest education level: Not on file  Occupational History   Occupation: Retired  Tobacco Use   Smoking status: Never   Smokeless tobacco: Never  Vaping Use   Vaping Use: Never used  Substance and Sexual Activity   Alcohol use: No   Drug use: No   Sexual activity: Not Currently  Other Topics Concern   Not on file  Social History Narrative   Not on file   Social Determinants of Health   Financial Resource Strain: Not on file  Food Insecurity: No Food Insecurity   Worried About Running Out of Food in the Last Year: Never true   [place] in the Last Year: Never true  Transportation Needs: No Transportation Needs   Lack of Transportation (Medical): No   Lack of Transportation (Non-Medical): No  Physical Activity: Insufficiently Active   Days of Exercise per Week: 5 days   Minutes of Exercise per Session: 10 min  Stress: Not on file  Social Connections: Not on file    Review of Systems CONSTITUTIONAL: see HPI E/N/T: Negative for ear pain, nasal congestion and sore throat.  CARDIOVASCULAR: Negative for chest pain, dizziness, palpitations and pedal edema.  RESPIRATORY: Negative for recent cough and dyspnea.  GASTROINTESTINAL: Negative for abdominal pain, acid reflux symptoms, constipation, diarrhea, nausea  and vomiting.  MSK: see HPI INTEGUMENTARY: Negative for rash.  NEUROLOGICAL: Negative for dizziness and headaches.  PSYCHIATRIC: Negative for sleep disturbance and to question depression screen.  Negative for depression, negative for anhedonia.       Objective:  BP 106/60 (BP Location: Left Arm, Patient Position: Sitting, Cuff Size: Small)   Pulse 73   Temp 97.6 F (36.4 C) (Temporal)  Ht 4\' 10"  (1.473 m)   Wt 91 lb 3.2 oz (41.4 kg)   SpO2 99%   BMI 19.06 kg/m   BP/Weight 04/18/2021 01/29/8468 02/24/9527  Systolic BP 413 244 010  Diastolic BP 60 52 58  Wt. (Lbs) 91.2 92.2 90.6  BMI 19.06 19.27 18.94    Physical Exam PHYSICAL EXAM:   VS: BP 106/60 (BP Location: Left Arm, Patient Position: Sitting, Cuff Size: Small)   Pulse 73   Temp 97.6 F (36.4 C) (Temporal)   Ht 4\' 10"  (1.473 m)   Wt 91 lb 3.2 oz (41.4 kg)   SpO2 99%   BMI 19.06 kg/m   GEN: Well nourished, well developed, in no acute distress - thin,frail Cardiac: RRR; no murmurs, rubs, or gallops,no edema - Respiratory:  normal respiratory rate and pattern with no distress - normal breath sounds with no rales, rhonchi, wheezes or rubs Skin: warm and dry, no rash  Psych: euthymic mood, appropriate affect and demeanor  EKG normal Office Visit on 04/18/2021  Component Date Value Ref Range Status   SARS Coronavirus 2 Ag 04/18/2021 Negative  Negative Final    Diabetic Foot Exam - Simple   No data filed      Lab Results  Component Value Date   WBC 7.0 02/24/2021   HGB 12.5 02/24/2021   HCT 39.1 02/24/2021   PLT 265 02/24/2021   GLUCOSE 85 02/24/2021   CHOL 216 (H) 02/24/2021   TRIG 186 (H) 02/24/2021   HDL 61 02/24/2021   LDLCALC 123 (H) 02/24/2021   ALT 14 02/24/2021   AST 21 02/24/2021   NA 137 02/24/2021   K 4.9 02/24/2021   CL 100 02/24/2021   CREATININE 1.44 (H) 02/24/2021   BUN 11 02/24/2021   CO2 23 02/24/2021   TSH 8.600 (H) 02/24/2021      Assessment & Plan:   1. Generalized  weakness - POC COVID-19 BinaxNow - EKG 12-Lead - CBC with Differential/Platelet - Comprehensive metabolic panel - TSH   Will await labwork and discuss further treatment No orders of the defined types were placed in this encounter.   Orders Placed This Encounter  Procedures   CBC with Differential/Platelet   Comprehensive metabolic panel   TSH   POC COVID-19 BinaxNow   EKG 12-Lead     Follow-up: Return if symptoms worsen or fail to improve.  An After Visit Summary was printed and given to the patient.  Yetta Flock [facility] [phone]

## 2021-04-19 LAB — CBC WITH DIFFERENTIAL/PLATELET
Basophils Absolute: 0 x10E3/uL (ref 0.0–0.2)
Basos: 0 %
EOS (ABSOLUTE): 0.1 x10E3/uL (ref 0.0–0.4)
Eos: 1 %
Hematocrit: 37.9 % (ref 34.0–46.6)
Hemoglobin: 12.2 g/dL (ref 11.1–15.9)
Immature Grans (Abs): 0 x10E3/uL (ref 0.0–0.1)
Immature Granulocytes: 0 %
Lymphocytes Absolute: 2.4 x10E3/uL (ref 0.7–3.1)
Lymphs: 34 %
MCH: 29.6 pg (ref 26.6–33.0)
MCHC: 32.2 g/dL (ref 31.5–35.7)
MCV: 92 fL (ref 79–97)
Monocytes Absolute: 0.9 x10E3/uL (ref 0.1–0.9)
Monocytes: 13 %
Neutrophils Absolute: 3.6 x10E3/uL (ref 1.4–7.0)
Neutrophils: 52 %
Platelets: 267 x10E3/uL (ref 150–450)
RBC: 4.12 x10E6/uL (ref 3.77–5.28)
RDW: 13.2 % (ref 11.7–15.4)
WBC: 7.1 x10E3/uL (ref 3.4–10.8)

## 2021-04-19 LAB — COMPREHENSIVE METABOLIC PANEL WITH GFR
ALT: 14 IU/L (ref 0–32)
AST: 18 IU/L (ref 0–40)
Albumin/Globulin Ratio: 2.3 — ABNORMAL HIGH (ref 1.2–2.2)
Albumin: 4.6 g/dL (ref 3.6–4.6)
Alkaline Phosphatase: 91 IU/L (ref 44–121)
BUN/Creatinine Ratio: 12 (ref 12–28)
BUN: 16 mg/dL (ref 8–27)
Bilirubin Total: 0.2 mg/dL (ref 0.0–1.2)
CO2: 23 mmol/L (ref 20–29)
Calcium: 9.3 mg/dL (ref 8.7–10.3)
Chloride: 102 mmol/L (ref 96–106)
Creatinine, Ser: 1.3 mg/dL — ABNORMAL HIGH (ref 0.57–1.00)
Globulin, Total: 2 g/dL (ref 1.5–4.5)
Glucose: 84 mg/dL (ref 65–99)
Potassium: 4.4 mmol/L (ref 3.5–5.2)
Sodium: 139 mmol/L (ref 134–144)
Total Protein: 6.6 g/dL (ref 6.0–8.5)
eGFR: 41 mL/min/1.73 — ABNORMAL LOW

## 2021-04-19 LAB — TSH: TSH: 4.42 u[IU]/mL (ref 0.450–4.500)

## 2021-04-20 ENCOUNTER — Other Ambulatory Visit: Payer: Self-pay

## 2021-04-20 DIAGNOSIS — E039 Hypothyroidism, unspecified: Secondary | ICD-10-CM

## 2021-04-21 DIAGNOSIS — Z1231 Encounter for screening mammogram for malignant neoplasm of breast: Secondary | ICD-10-CM | POA: Diagnosis not present

## 2021-05-09 ENCOUNTER — Ambulatory Visit: Payer: Medicare Other | Admitting: Endocrinology

## 2021-05-09 ENCOUNTER — Telehealth: Payer: Self-pay

## 2021-05-09 NOTE — Chronic Care Management (AMB) (Signed)
Chronic Care Management Pharmacy Assistant   Name: Valerie Kelley  MRN: [id number] DOB: January 03, 1934  Reason for Encounter: General Adherence Call  Recent office visits:  04/18/21- Valerie Duncans, PA-C- seen for general weakness, labs ordered, no medication changes, follow up as needed  Recent consult visits:  No visits noted  Hospital visits:  None in previous 6 months  Medications: Outpatient Encounter Medications as of 05/09/2021  Medication Sig   ACETAMINOPHEN PO Take 650 mg by mouth in the morning and at bedtime. For pain p   albuterol (PROVENTIL HFA;VENTOLIN HFA) 108 (90 BASE) MCG/ACT inhaler Inhale 2 puffs into the lungs every 6 (six) hours as needed for wheezing. For wheezing   b complex vitamins capsule Take 1 capsule by mouth daily.   budesonide-formoterol (SYMBICORT) 160-4.5 MCG/ACT inhaler Inhale 1 puff into the lungs daily as needed.   Cholecalciferol (VITAMIN D3) 125 MCG (5000 UT) TBDP Take 5,000 Units by mouth daily.   cimetidine (TAGAMET) 300 MG tablet Take 300 mg by mouth daily.   co-enzyme Q-10 30 MG capsule Take 30 mg by mouth daily.   DULoxetine (CYMBALTA) 20 MG capsule TAKE 1 CAPSULE BY MOUTH TWICE A DAY (Patient taking differently: Take 20 mg by mouth daily.)   EnovaRX-Naproxen 10 % CREA Apply 1 application topically 4 (four) times daily as needed (joint pain).   fish oil-omega-3 fatty acids 1000 MG capsule Take 2 capsules by mouth daily. Patient using wild alaskan salmon product with 2400 mg   Flaxseed Oil OIL by Does not apply route daily.   Glucosamine Sulfate 1000 MG CAPS Take 1 capsule by mouth daily.   Lecithin GRAN by Does not apply route daily.   levothyroxine (SYNTHROID) 25 MCG tablet TAKE 1 TABLET BY MOUTH EVERY DAY BEFORE BREAKFAST   loratadine (CLARITIN) 10 MG tablet Take 10 mg by mouth daily as needed.   Multiple Vitamins-Minerals (ALIVE WOMENS 50+) TABS Take 1 tablet by mouth daily at 12 noon.   polyethylene glycol (MIRALAX / GLYCOLAX) packet Take  51 g by mouth daily as needed. Takes 3 capfuls daily   senna (SENOKOT) 8.6 MG tablet Take 1 tablet by mouth daily.   tiotropium (SPIRIVA) 18 MCG inhalation capsule Place 18 mcg into inhaler and inhale daily as needed.   traZODone (DESYREL) 50 MG tablet Take 3 tablets (150 mg total) by mouth at bedtime.   TURMERIC PO Take 1 capsule by mouth daily.   vitamin E 400 UNIT capsule Take 400 Units by mouth daily.   No facility-administered encounter medications on file as of 05/09/2021.    Contacted Valerie Kelley for general disease state and medication adherence call.   Patient is not > 5 days past due for refill on the following medications per chart history:  Star Medications: No star medications   What concerns do you have about your medications? Patient stated she has stopped taking her levothyroxine on 06/23 due to having elevated BS   The patient denies side effects with her medications.   How often do you forget or accidentally miss a dose?  Patient stated she never misses a dose   Do you use a pillbox?  Patient stated she puts her vitamins in a pill box and keeps her maintenance medications in the vials  Are you having any problems getting your medications from your pharmacy? No  Has the cost of your medications been a concern?  Patient stated she is sometimes unable to get some medications due to the cost  of her Spiriva and Symbicort. Her Spiriva cost $45/ month . She is currently on a $500 limit from her supplemental insurance and once she surpasses $2400 she will fall into the donut which she is trying to avoid. Although she hasn't filled her symbicort since last year she remarks that this was still expensive the last time she received it.   Since last visit with CPP, no interventions have been made:   The patient has not had an ED visit since last contact.   The patient denies problems with their health.   she denies  concerns or questions for Valerie Kelley, at this  time.   Patient states BP and BG readings are as follows  Fasting: 84,119,109 Before meals:  After meals:  Bedtime:    BP  : 119/54; 137/67; 143/68  Counseled patient on:   [place] job taking medications!   Importance of taking medication daily without missed doses  Benefits of adherence packaging or a pillbox   Access to CCM team for any cost, medication, or pharmacy concerns  Care Gaps: Last annual wellness visit? 38/38/18 If applicable: N/A Last eye exam / retinopathy screening? Diabetic foot exam?  Valerie Kelley CPA, CMA

## 2021-05-15 ENCOUNTER — Other Ambulatory Visit: Payer: Self-pay | Admitting: Family Medicine

## 2021-05-17 DIAGNOSIS — N183 Chronic kidney disease, stage 3 unspecified: Secondary | ICD-10-CM | POA: Diagnosis not present

## 2021-05-17 DIAGNOSIS — I129 Hypertensive chronic kidney disease with stage 1 through stage 4 chronic kidney disease, or unspecified chronic kidney disease: Secondary | ICD-10-CM | POA: Diagnosis not present

## 2021-05-21 DIAGNOSIS — S7001XA Contusion of right hip, initial encounter: Secondary | ICD-10-CM | POA: Diagnosis not present

## 2021-05-22 DIAGNOSIS — I129 Hypertensive chronic kidney disease with stage 1 through stage 4 chronic kidney disease, or unspecified chronic kidney disease: Secondary | ICD-10-CM | POA: Diagnosis not present

## 2021-05-22 DIAGNOSIS — N183 Chronic kidney disease, stage 3 unspecified: Secondary | ICD-10-CM | POA: Diagnosis not present

## 2021-05-22 DIAGNOSIS — R7989 Other specified abnormal findings of blood chemistry: Secondary | ICD-10-CM | POA: Diagnosis not present

## 2021-05-22 DIAGNOSIS — E559 Vitamin D deficiency, unspecified: Secondary | ICD-10-CM | POA: Diagnosis not present

## 2021-05-22 DIAGNOSIS — N1832 Chronic kidney disease, stage 3b: Secondary | ICD-10-CM

## 2021-05-22 DIAGNOSIS — D631 Anemia in chronic kidney disease: Secondary | ICD-10-CM

## 2021-05-22 HISTORY — DX: Chronic kidney disease, stage 3b: N18.32

## 2021-05-22 HISTORY — DX: Anemia in chronic kidney disease: D63.1

## 2021-06-01 ENCOUNTER — Other Ambulatory Visit: Payer: Medicare Other

## 2021-06-01 ENCOUNTER — Other Ambulatory Visit: Payer: Self-pay

## 2021-06-01 DIAGNOSIS — D631 Anemia in chronic kidney disease: Secondary | ICD-10-CM | POA: Diagnosis not present

## 2021-06-01 DIAGNOSIS — E039 Hypothyroidism, unspecified: Secondary | ICD-10-CM

## 2021-06-01 DIAGNOSIS — N1832 Chronic kidney disease, stage 3b: Secondary | ICD-10-CM

## 2021-06-01 LAB — CBC WITH DIFFERENTIAL/PLATELET
Basophils Absolute: 0 10*3/uL (ref 0.0–0.2)
Basos: 0 %
EOS (ABSOLUTE): 0.3 10*3/uL (ref 0.0–0.4)
Eos: 5 %
Hematocrit: 37.4 % (ref 34.0–46.6)
Hemoglobin: 12.2 g/dL (ref 11.1–15.9)
Immature Grans (Abs): 0 10*3/uL (ref 0.0–0.1)
Immature Granulocytes: 0 %
Lymphocytes Absolute: 1.5 10*3/uL (ref 0.7–3.1)
Lymphs: 24 %
MCH: 29.8 pg (ref 26.6–33.0)
MCHC: 32.6 g/dL (ref 31.5–35.7)
MCV: 91 fL (ref 79–97)
Monocytes Absolute: 0.9 10*3/uL (ref 0.1–0.9)
Monocytes: 15 %
Neutrophils Absolute: 3.5 10*3/uL (ref 1.4–7.0)
Neutrophils: 56 %
Platelets: 278 10*3/uL (ref 150–450)
RBC: 4.09 x10E6/uL (ref 3.77–5.28)
RDW: 13.2 % (ref 11.7–15.4)
WBC: 6.3 10*3/uL (ref 3.4–10.8)

## 2021-06-01 LAB — COMPREHENSIVE METABOLIC PANEL
ALT: 14 IU/L (ref 0–32)
AST: 20 IU/L (ref 0–40)
Albumin/Globulin Ratio: 1.7 (ref 1.2–2.2)
Albumin: 4.4 g/dL (ref 3.6–4.6)
Alkaline Phosphatase: 96 IU/L (ref 44–121)
BUN/Creatinine Ratio: 11 — ABNORMAL LOW (ref 12–28)
BUN: 15 mg/dL (ref 8–27)
Bilirubin Total: 0.3 mg/dL (ref 0.0–1.2)
CO2: 25 mmol/L (ref 20–29)
Calcium: 9.5 mg/dL (ref 8.7–10.3)
Chloride: 101 mmol/L (ref 96–106)
Creatinine, Ser: 1.35 mg/dL — ABNORMAL HIGH (ref 0.57–1.00)
Globulin, Total: 2.6 g/dL (ref 1.5–4.5)
Glucose: 66 mg/dL (ref 65–99)
Potassium: 4.4 mmol/L (ref 3.5–5.2)
Sodium: 139 mmol/L (ref 134–144)
Total Protein: 7 g/dL (ref 6.0–8.5)
eGFR: 39 mL/min/{1.73_m2} — ABNORMAL LOW (ref >59)

## 2021-06-02 LAB — TSH: TSH: 6.58 u[IU]/mL — ABNORMAL HIGH (ref 0.450–4.500)

## 2021-06-07 ENCOUNTER — Other Ambulatory Visit: Payer: Self-pay | Admitting: Family Medicine

## 2021-06-07 DIAGNOSIS — E039 Hypothyroidism, unspecified: Secondary | ICD-10-CM

## 2021-06-07 NOTE — Progress Notes (Signed)
Referral made to endocrinology. kc

## 2021-06-19 DIAGNOSIS — J45909 Unspecified asthma, uncomplicated: Secondary | ICD-10-CM | POA: Diagnosis not present

## 2021-06-19 DIAGNOSIS — J449 Chronic obstructive pulmonary disease, unspecified: Secondary | ICD-10-CM | POA: Diagnosis not present

## 2021-06-19 DIAGNOSIS — Z20828 Contact with and (suspected) exposure to other viral communicable diseases: Secondary | ICD-10-CM | POA: Diagnosis not present

## 2021-06-21 ENCOUNTER — Telehealth: Payer: Self-pay

## 2021-06-21 NOTE — Chronic Care Management (AMB) (Signed)
    Chronic Care Management Pharmacy Assistant   Name: Valerie Kelley  MRN: [id number] DOB: [birth year]   Reason for Encounter: Pt called to R/S 06/28/21 appt with CPP    Medications: Outpatient Encounter Medications as of 06/21/2021  Medication Sig   ACETAMINOPHEN PO Take 650 mg by mouth in the morning and at bedtime. For pain p   albuterol (PROVENTIL HFA;VENTOLIN HFA) 108 (90 BASE) MCG/ACT inhaler Inhale 2 puffs into the lungs every 6 (six) hours as needed for wheezing. For wheezing   b complex vitamins capsule Take 1 capsule by mouth daily.   budesonide-formoterol (SYMBICORT) 160-4.5 MCG/ACT inhaler Inhale 1 puff into the lungs daily as needed.   Cholecalciferol (VITAMIN D3) 125 MCG (5000 UT) TBDP Take 5,000 Units by mouth daily.   cimetidine (TAGAMET) 300 MG tablet Take 300 mg by mouth daily.   co-enzyme Q-10 30 MG capsule Take 30 mg by mouth daily.   DULoxetine (CYMBALTA) 20 MG capsule TAKE 1 CAPSULE BY MOUTH TWICE A DAY   EnovaRX-Naproxen 10 % CREA Apply 1 application topically 4 (four) times daily as needed (joint pain).   fish oil-omega-3 fatty acids 1000 MG capsule Take 2 capsules by mouth daily. Patient using wild alaskan salmon product with 2400 mg   Flaxseed Oil OIL by Does not apply route daily.   Glucosamine Sulfate 1000 MG CAPS Take 1 capsule by mouth daily.   Lecithin GRAN by Does not apply route daily.   levothyroxine (SYNTHROID) 25 MCG tablet TAKE 1 TABLET BY MOUTH EVERY DAY BEFORE BREAKFAST   loratadine (CLARITIN) 10 MG tablet Take 10 mg by mouth daily as needed.   Multiple Vitamins-Minerals (ALIVE WOMENS 50+) TABS Take 1 tablet by mouth daily at 12 noon.   polyethylene glycol (MIRALAX / GLYCOLAX) packet Take 51 g by mouth daily as needed. Takes 3 capfuls daily   senna (SENOKOT) 8.6 MG tablet Take 1 tablet by mouth daily.   tiotropium (SPIRIVA) 18 MCG inhalation capsule Place 18 mcg into inhaler and inhale daily as needed.   traZODone (DESYREL) 50 MG tablet Take 3  tablets (150 mg total) by mouth at bedtime.   TURMERIC PO Take 1 capsule by mouth daily.   vitamin E 400 UNIT capsule Take 400 Units by mouth daily.   No facility-administered encounter medications on file as of 06/21/2021.   I spoke with pt and pt appt has been r/s for 07/17/21 @ 10:00 am with [name], CPP.  [place]

## 2021-06-26 DIAGNOSIS — J301 Allergic rhinitis due to pollen: Secondary | ICD-10-CM | POA: Diagnosis not present

## 2021-06-26 DIAGNOSIS — J4520 Mild intermittent asthma, uncomplicated: Secondary | ICD-10-CM | POA: Diagnosis not present

## 2021-06-28 ENCOUNTER — Telehealth: Payer: Medicare Other

## 2021-06-28 DIAGNOSIS — R635 Abnormal weight gain: Secondary | ICD-10-CM | POA: Diagnosis not present

## 2021-06-28 DIAGNOSIS — E039 Hypothyroidism, unspecified: Secondary | ICD-10-CM | POA: Diagnosis not present

## 2021-07-07 DIAGNOSIS — Z23 Encounter for immunization: Secondary | ICD-10-CM | POA: Diagnosis not present

## 2021-07-07 DIAGNOSIS — E039 Hypothyroidism, unspecified: Secondary | ICD-10-CM | POA: Diagnosis not present

## 2021-07-10 DIAGNOSIS — Z23 Encounter for immunization: Secondary | ICD-10-CM | POA: Diagnosis not present

## 2021-07-17 ENCOUNTER — Other Ambulatory Visit: Payer: Self-pay

## 2021-07-17 ENCOUNTER — Ambulatory Visit (INDEPENDENT_AMBULATORY_CARE_PROVIDER_SITE_OTHER): Payer: Medicare Other

## 2021-07-17 DIAGNOSIS — E7849 Other hyperlipidemia: Secondary | ICD-10-CM

## 2021-07-17 DIAGNOSIS — E782 Mixed hyperlipidemia: Secondary | ICD-10-CM

## 2021-07-17 DIAGNOSIS — E039 Hypothyroidism, unspecified: Secondary | ICD-10-CM

## 2021-07-17 DIAGNOSIS — N184 Chronic kidney disease, stage 4 (severe): Secondary | ICD-10-CM

## 2021-07-17 DIAGNOSIS — I129 Hypertensive chronic kidney disease with stage 1 through stage 4 chronic kidney disease, or unspecified chronic kidney disease: Secondary | ICD-10-CM

## 2021-07-17 NOTE — Progress Notes (Signed)
Chronic Care Management Pharmacy Note  07/17/2021 Name:  Valerie Kelley MRN:  [id number] DOB:  1934-04-17   Plan Updates:  Patient declines PAP for Spiriva She got new insurance, asked her to bring in card. Could we change her default Pharmacy to [facility] so refills are sent to the correct place?  Subjective: Valerie Kelley is an 87 y.o. year old female who is a primary patient of Cox, Kirsten, MD.  The CCM team was consulted for assistance with disease management and care coordination needs.    Engaged with patient by telephone for initial visit in response to provider referral for pharmacy case management and/or care coordination services.   Consent to Services:  The patient was given information about Chronic Care Management services, agreed to services, and gave verbal consent prior to initiation of services.  Please see initial visit note for detailed documentation.   Patient Care Team: [name], MD as PCP - General (Family Medicine) Scherrie November, MD as Referring Physician (Gastroenterology) Kyung Rudd., OD (Optometry) Suella Broad, MD as Consulting Physician (Physical Medicine and Rehabilitation) Gardiner Rhyme, MD as Referring Physician (Specialist) Thereasa Distance, MD as Referring Physician (Nephrology) Burnice Logan, [facility] (Inactive) as Pharmacist (Pharmacist)   Recent office visits:  12/27/20-Richard Ramos, Lumbar pain, [facility]   12/14/20-Carol Bayard Males, Lumbar pain, [facility]   12/08/20-Neurology, recommended hip injection   11/28/20-Dr. Cox PCP, patient preferred stopping Levothyroxine 15mg, Tsh slightly abnormal, but free thyroxine was normal. No medicines at this time. Recheck in 3 months.   Recommend zetia 10 mg once daily.   11/21/20-Dr.Olivia Mackie Nephrology, no medication changes, avoid NSAIDS, follow up 662mo  Recent consult visits:  none   Hospital visits:  None in previous 6 months  Objective:  Lab Results  Component Value Date    CREATININE 1.35 (H) 06/01/2021   BUN 15 06/01/2021   GFRNONAA 41 (L) 10/06/2020   GFRAA 47 (L) 10/06/2020   NA 139 06/01/2021   K 4.4 06/01/2021   CALCIUM 9.5 06/01/2021   CO2 25 06/01/2021   GLUCOSE 66 06/01/2021    No results found for: HGBA1C, FRUCTOSAMINE, GFR, MICROALBUR  Last diabetic Eye exam: No results found for: HMDIABEYEEXA  Last diabetic Foot exam: No results found for: HMDIABFOOTEX   Lab Results  Component Value Date   CHOL 216 (H) 02/24/2021   HDL 61 02/24/2021   LDLCALC 123 (H) 02/24/2021   TRIG 186 (H) 02/24/2021   CHOLHDL 3.5 02/24/2021    Hepatic Function Latest Ref Rng & Units 06/01/2021 04/18/2021 02/24/2021  Total Protein 6.0 - 8.5 g/dL 7.0 6.6 7.1  Albumin 3.6 - 4.6 g/dL 4.4 4.6 4.5  AST 0 - 40 IU/L _0 ALT 0 - 32 IU/L _1 Alk Phosphatase 44 - 121 IU/L 96 91 83  Total Bilirubin 0.0 - 1.2 mg/dL 0.3 0.2 0.4    Lab Results  Component Value Date/Time   TSH 6.580 (H) 06/01/2021 10:04 AM   TSH 4.420 04/18/2021 11:22 AM   FREET4 1.07 02/24/2021 08:42 AM   FREET4 1.21 11/28/2020 11:35 AM    CBC Latest Ref Rng & Units 06/01/2021 04/18/2021 02/24/2021  WBC 3.4 - 10.8 x10E3/uL 6.3 7.1 7.0  Hemoglobin 11.1 - 15.9 g/dL 12.2 12.2 12.5  Hematocrit 34.0 - 46.6 % 37.4 37.9 39.1  Platelets 150 - 450 x10E3/uL 278 267 265    Lab Results  Component Value Date/Time   VD25OH 55.0 08/25/2020 10:06 AM  Clinical ASCVD: No  The ASCVD Risk score (Arnett DK, et al., 2019) failed to calculate for the following reasons:   The 2019 ASCVD risk score is only valid for ages 75 to 41    Depression screen PHQ 2/9 03/29/2021 02/27/2021 02/24/2021  Decreased Interest 0 0 0  Down, Depressed, Hopeless 0 0 0  PHQ - 2 Score 0 0 0  Altered sleeping 0 0 -  Tired, decreased energy 3 3 -  Change in appetite 0 0 -  Feeling bad or failure about yourself  0 0 -  Trouble concentrating 0 0 -  Moving slowly or fidgety/restless 0 0 -  Suicidal thoughts 0 0 -  PHQ-9 Score 3 3 -   Difficult doing work/chores Not difficult at all Not difficult at all -     Social History   Tobacco Use  Smoking Status Never  Smokeless Tobacco Never   BP Readings from Last 3 Encounters:  04/18/21 106/60  03/29/21 (!) 108/52  02/24/21 (!) 108/58   Pulse Readings from Last 3 Encounters:  04/18/21 73  03/29/21 62  02/24/21 96   Wt Readings from Last 3 Encounters:  04/18/21 91 lb 3.2 oz (41.4 kg)  03/29/21 92 lb 3.2 oz (41.8 kg)  02/24/21 90 lb 9.6 oz (41.1 kg)   BMI Readings from Last 3 Encounters:  04/18/21 19.06 kg/m  03/29/21 19.27 kg/m  02/24/21 18.94 kg/m    Assessment/Interventions: Review of patient past medical history, allergies, medications, health status, including review of consultants reports, laboratory and other test data, was performed as part of comprehensive evaluation and provision of chronic care management services.   SDOH:  (Social Determinants of Health) assessments and interventions performed: Yes SDOH Interventions    Flowsheet Row Most Recent Value  SDOH Interventions   Financial Strain Interventions Intervention Not Indicated      SDOH Screenings   Alcohol Screen: Not on file  Depression (PHQ2-9): Low Risk    PHQ-2 Score: 3  Financial Resource Strain: Unknown   Difficulty of Paying Living Expenses: Patient refused  Food Insecurity: No Food Insecurity   Worried About Charity fundraiser in the Last Year: Never true   Ran Out of Food in the Last Year: Never true  Housing: Low Risk    Last Housing Risk Score: 0  Physical Activity: Insufficiently Active   Days of Exercise per Week: 5 days   Minutes of Exercise per Session: 10 min  Social Connections: Not on file  Stress: Not on file  Tobacco Use: Low Risk    Smoking Tobacco Use: Never   Smokeless Tobacco Use: Never   Passive Exposure: Not on file  Transportation Needs: No Transportation Needs   Lack of Transportation (Medical): No   Lack of Transportation (Non-Medical): No     CCM Care Plan  Allergies  Allergen Reactions   Codeine Nausea And Vomiting and Nausea Only   Darvocet [Propoxyphene N-Acetaminophen] Nausea And Vomiting   Glucosamine    Other Hives and Nausea And Vomiting   Prednisone Swelling and Other (See Comments)    Face swells and constant headache And headache   Pregabalin Other (See Comments)    Makes her feel drunk.   Shellfish Allergy Hives   Statins     Muscle pain.     Welchol [Colesevelam]     Muscle pain   Zetia [Ezetimibe]    Penicillins Rash    Medications Reviewed Today     Reviewed by Eliot Ford (Physician  Assistant) on 04/18/21 at [place] List Status: <None>   Medication Order Taking? Sig Documenting Provider Last Dose Status Informant  ACETAMINOPHEN PO [id number] Yes Take 650 mg by mouth in the morning and at bedtime. For pain p [provider] Taking Active Self  albuterol (PROVENTIL HFA;VENTOLIN HFA) 108 (90 BASE) MCG/ACT inhaler [id number] Yes Inhale 2 puffs into the lungs every 6 (six) hours as needed for wheezing. For wheezing [provider] Taking Active Self  b complex vitamins capsule [id number] Yes Take 1 capsule by mouth daily. [provider] Taking Active   budesonide-formoterol (SYMBICORT) 160-4.5 MCG/ACT inhaler [id number] Yes Inhale 1 puff into the lungs daily as needed. [provider] Taking Active   Cholecalciferol (VITAMIN D3) 125 MCG (5000 UT) TBDP [id number] Yes Take 5,000 Units by mouth daily. [provider] Taking Active Self  cimetidine (TAGAMET) 300 MG tablet [id number] Yes Take 300 mg by mouth daily. [provider] Taking Active   co-enzyme Q-10 30 MG capsule [id number] Yes Take 30 mg by mouth daily. [provider] Taking Active   DULoxetine (CYMBALTA) 20 MG capsule [id number] Yes TAKE 1 CAPSULE BY MOUTH TWICE A DAY  Patient taking differently: Take 20 mg by mouth daily.   Cox, Kirsten, MD Taking Active   EnovaRX-Naproxen 10 % CREA  [id number] Yes Apply 1 application topically 4 (four) times daily as needed (joint pain). Cox, Kirsten, MD Taking Active   fish oil-omega-3 fatty acids 1000 MG capsule [id number] Yes Take 2 capsules by mouth daily. Patient using wild alaskan salmon product with 2400 mg [provider] Taking Active Self  Flaxseed Oil OIL [id number] Yes by Does not apply route daily. [provider] Taking Active   Glucosamine Sulfate 1000 MG CAPS [id number] Yes Take 1 capsule by mouth daily. [provider] Taking Active   Lecithin Theodoro Parma [id number] Yes by Does not apply route daily. [provider] Taking Active   levothyroxine (SYNTHROID) 25 MCG tablet [id number] Yes TAKE 1 TABLET BY MOUTH EVERY DAY BEFORE BREAKFAST Cox, Kirsten, MD Taking Active   loratadine (CLARITIN) 10 MG tablet [id number] Yes Take 10 mg by mouth daily as needed. [provider] Taking Active Self  Multiple Vitamins-Minerals (ALIVE WOMENS 50+) TABS [id number] Yes Take 1 tablet by mouth daily at 12 noon. [provider] Taking Active   polyethylene glycol [facility] / GLYCOLAX) packet [id number] Yes Take 51 g by mouth daily as needed. Takes 3 capfuls daily [provider] Taking Active Self  senna (SENOKOT) 8.6 MG tablet [id number] Yes Take 1 tablet by mouth daily. [provider] Taking Active   tiotropium (SPIRIVA) 18 MCG inhalation capsule [id number] Yes Place 18 mcg into inhaler and inhale daily as needed. [provider] Taking Active Self  traZODone (DESYREL) 50 MG tablet [id number] Yes Take 3 tablets (150 mg total) by mouth at bedtime. [name], MD Taking Active   TURMERIC PO [id number] Yes Take 1 capsule by mouth daily. [provider] Taking Active   vitamin E 400 UNIT capsule [id number] Yes Take 400 Units by mouth daily. [provider] Taking Active Self            Patient Active Problem List   Diagnosis Date Noted   Lumbar radiculopathy 02/17/2021    Prerenal azotemia 09/13/2020   Moderate persistent asthma without complication 03/88/8280   Osteoporosis, post-menopausal 08/24/2020   Hypertensive kidney disease with stage 4 chronic kidney disease ([place]) 08/24/2020   Palpitations 07/21/2020   Dermatitis 06/11/2020  Atopic dermatitis 06/02/2020   Mixed hyperlipidemia 02/24/2020   Hypothyroidism 02/24/2020   Gastroparesis 02/24/2020   Chronic idiopathic constipation 02/24/2020   Spinal stenosis in cervical region 06/23/2018   DDD (degenerative disc disease), cervical 04/21/2018   Osteoarthritis of multiple joints 05/24/2016   Pain in joints 05/24/2016    Immunization History  Administered Date(s) Administered   Influenza Split 06/05/2017, 06/05/2019, 06/01/2020   Influenza-Unspecified 06/05/2017   PFIZER(Purple Top)SARS-COV-2 Vaccination 12/04/2019, 12/30/2019, 07/08/2020   Pneumococcal Conjugate-13 12/06/2014   Pneumococcal Polysaccharide-23 07/23/1998, 03/22/2020   Tdap 07/30/2012   Zoster Recombinat (Shingrix) 12/15/2020    Conditions to be addressed/monitored:  Hypertension, Hyperlipidemia, Asthma, Chronic Kidney Disease, Hypothyroidism, Osteoporosis and Osteoarthritis  Care Plan : [place]  Updates made by Lane Hacker, [place] since 07/17/2021 12:00 AM     Problem: htn, hld, osteoporosis   Priority: High  Onset Date: 01/17/2021     Goal: Disease State Management   Start Date: 01/17/2021  Expected End Date: 01/17/2022  Recent Progress: On track  Priority: High  Note:    Current Barriers:  Unable to achieve control of cholesterol   Pharmacist Clinical Goal(s):  Patient will achieve control of cholesterol as evidenced by lipid panel  through collaboration with PharmD and provider.   Interventions: 1:1 collaboration with [name], MD regarding development and update of comprehensive plan of care as evidenced by provider attestation and co-signature Inter-disciplinary care team collaboration  (see longitudinal plan of care) Comprehensive medication review performed; medication list updated in electronic medical record  Hypertension (BP goal <130/80) BP Readings from Last 3 Encounters:  04/18/21 106/60  03/29/21 (!) 108/52  02/24/21 (!) 108/58  -Controlled -Current treatment: diet and lifestyle  -Medications previously tried:  None reported -Current home readings:  145/70 pulse 71, 143/68 pulse 74 -Current dietary habits: oatmeal for breakfast, potatoes, broccoli, carrots, chicken, mashed potatoes, snacks on junk food. Drinks 2 - 32 ounce bottles of water daily.  -Current exercise habits: walks to mailbox and works in house/yard -Denies hypotensive/hypertensive symptoms -Educated on BP goals and benefits of medications for prevention of heart attack, stroke and kidney damage; Daily salt intake goal < 2300 mg; Exercise goal of 150 minutes per week; Importance of home blood pressure monitoring; Proper BP monitoring technique; -Counseled to monitor BP at home weekly, document, and provide log at future appointments -Counseled on diet and exercise extensively Educated on benefits of blood pressure control for kidney protection.   Hyperlipidemia: (LDL goal < 100) The ASCVD Risk score (Arnett DK, et al., 2019) failed to calculate for the following reasons:   The 2019 ASCVD risk score is only valid for ages 67 to 99 Lab Results  Component Value Date   CHOL 216 (H) 02/24/2021   CHOL 329 (H) 08/25/2020   CHOL 291 (H) 02/24/2020   Lab Results  Component Value Date   HDL 61 02/24/2021   HDL 82 08/25/2020   HDL 69 02/24/2020   Lab Results  Component Value Date   LDLCALC 123 (H) 02/24/2021   LDLCALC 224 (H) 08/25/2020   LDLCALC 198 (H) 02/24/2020   Lab Results  Component Value Date   TRIG 186 (H) 02/24/2021   TRIG 129 08/25/2020   TRIG 134 02/24/2020   Lab Results  Component Value Date   CHOLHDL 3.5 02/24/2021   CHOLHDL 4.0 08/25/2020   CHOLHDL 4.2 02/24/2020   No results found for: LDLDIRECT -Uncontrolled -Current treatment: Fish oil 1000 mg 2 capsules daily  Lecithin granules daily Flaxseed  oil daily  -Medications previously tried: statins, zetia (stomach problems - stopped February) -Current dietary patterns: eats at home mainly. Potatoes, chicken and oatmeal are most common foods that she can tolerate.   -Current exercise habits: walks to mail box and  -Educated on Cholesterol goals;  Benefits of statin for ASCVD risk reduction; Importance of limiting foods high in cholesterol; Exercise goal of 150 minutes per week; -Counseled on diet and exercise extensively Educated on benefits of cholesterol management and options of non-statin medications. Patient reports that her stomach is very sensitive to medications and she is unable to tolerate many things.  Patient declines Repatha or Nexletol.   Asthma (Goal: control symptoms and prevent exacerbations) -Controlled -Current treatment  Spiriva 18 mcg daily prn Symbicort 1 puff daily prn  Albuterol inhaler 2 puffs every 6 hours prn wheezing -Medications previously tried: none reported  -Exacerbations requiring treatment in last 6 months: none -Patient denies consistent use of maintenance inhaler -Frequency of rescue inhaler use: rarely -Counseled on Proper inhaler technique; -Assessed patient's breathing. She reports asthma flares with allergy season. She reports managing well currently and rarely using inhalers.  October 2022: Asked patient about med cost, she told me the only med she pays for is Symbicort. I explained the process of PAP and how I could try to fill one out for her, she adamantly denied it. I asked her multiple times and she denied it  Osteoporosis / Osteopenia (Goal reduce risk of osteoporotic fracture) -Uncontrolled -Last DEXA Scan: 04/13/2020   T-Score femoral neck: -2.7  T-Score forearm radius: -2.6   -Patient is a candidate for pharmacologic treatment due to T-Score <  -2.5 in femoral neck -Current treatment  Vitamin d 5000 units daily  -Medications previously tried:  calcium (stopped due to kidneys) -Recommend [phone] units of vitamin D daily. Recommend 1200 mg of calcium daily from dietary and supplemental sources. Recommend weight-bearing and muscle strengthening exercises for building and maintaining bone density. -Counseled on diet and exercise extensively Recommended to continue current medication. Patient declines Prolia due to sensitivity to medications and she is unable to take calcium due to kidneys.   Insomnia (Goal: improve sleep) -Controlled (Sleeping 8 hours/night) -Current treatment  Trazodone 50 mg daily  -Medications previously tried: none reported  -Recommended to continue current medication   Constipation (Goal: improve bowel regularity) -Controlled -Current treatment  miralax daily prn Senokot daily  -Medications previously tried: none reported  -Recommended to continue current medication  Depression/Anxiety -Controlled -Current treatment: Duloxetine 71m BID -Medications previously tried/failed: N/A -PHQ9:  [facility] 03/29/2021 02/27/2021 02/24/2021  Decreased Interest 0 0 0  Down, Depressed, Hopeless 0 0 0  PHQ - 2 Score 0 0 0  Altered sleeping 0 0 -  Tired, decreased energy 3 3 -  Change in appetite 0 0 -  Feeling bad or failure about yourself  0 0 -  Trouble concentrating 0 0 -  Moving slowly or fidgety/restless 0 0 -  Suicidal thoughts 0 0 -  PHQ-9 Score 3 3 -  Difficult doing work/chores Not difficult at all Not difficult at all -  -GAD7: No flowsheet data found. -Educated on Benefits of medication for symptom control -Recommended to continue current medication   Health Maintenance -Vaccine gaps:  Shingles shot made her sick in March so won't be getting the second shot. Due for COVID second booster but patient declines.  -Current therapy:  acetaminophen 650 mg twice daily for hip pain  b  complex vitamins daily Loratadine  10 mg daily prn  Miralax daily prn  Turmeric daily Vitamin E 400 units daily  Coenzyme q-10 30 mg daily  Duloxetine 20 mg daily for night sweats Women's alive 50+ daily (started 3 days ago) -Educated on Herbal supplement research is limited and benefits usually cannot be proven -Patient is satisfied with current therapy and denies issues -Counseled on diet and exercise extensively   Patient Goals/Self-Care Activities Patient will:  - take medications as prescribed focus on medication adherence by using pill box check blood pressure weekly, document, and provide at future appointments target a minimum of 150 minutes of moderate intensity exercise weekly engage in dietary modifications by limiting sodium, drinking plenty of water and consuming heart healthy foods as tolerated with stomach.   Follow Up Plan: Telephone follow up appointment with care management team member scheduled for: July 2023        Medication Assistance: None required.  Patient affirms current coverage meets needs.  Patient's preferred pharmacy is:  [facility] ([place], [place] [address] Phone: [phone] Fax: [phone]  [id number] [id number]: [phone]: [phone] [address] [place] [place]Phone: [phone]: [phone] Uses pill box? Yes Pt endorses 100% compliance - denies missed doses of what she does take  We discussed: Current pharmacy is preferred with insurance plan and patient is satisfied with pharmacy services Patient decided to: Continue current medication management strategy  Care Plan and Follow Up Patient Decision:  Patient agrees to Care Plan and Follow-up.  Plan: Telephone follow up appointment with care  management team member scheduled for:  July 2023  [place] Constable PFloridaD. -- [phone]

## 2021-07-17 NOTE — Patient Instructions (Signed)
Visit Information   Goals Addressed   None    Patient Care Plan: CCM Pharmacy Care Plan     Problem Identified: htn, hld, osteoporosis   Priority: High  Onset Date: 01/17/2021     Goal: Disease State Management   Start Date: 01/17/2021  Expected End Date: 01/17/2022  Recent Progress: On track  Priority: High  Note:    Current Barriers:  Unable to achieve control of cholesterol   Pharmacist Clinical Goal(s):  Patient will achieve control of cholesterol as evidenced by lipid panel  through collaboration with PharmD and provider.   Interventions: 1:1 collaboration with [name], MD regarding development and update of comprehensive plan of care as evidenced by provider attestation and co-signature Inter-disciplinary care team collaboration (see longitudinal plan of care) Comprehensive medication review performed; medication list updated in electronic medical record  Hypertension (BP goal <130/80) BP Readings from Last 3 Encounters:  04/18/21 106/60  03/29/21 (!) 108/52  02/24/21 (!) 108/58  -Controlled -Current treatment: diet and lifestyle  -Medications previously tried:  None reported -Current home readings:  145/70 pulse 71, 143/68 pulse 74 -Current dietary habits: oatmeal for breakfast, potatoes, broccoli, carrots, chicken, mashed potatoes, snacks on junk food. Drinks 2 - 32 ounce bottles of water daily.  -Current exercise habits: walks to mailbox and works in house/yard -Denies hypotensive/hypertensive symptoms -Educated on BP goals and benefits of medications for prevention of heart attack, stroke and kidney damage; Daily salt intake goal < 2300 mg; Exercise goal of 150 minutes per week; Importance of home blood pressure monitoring; Proper BP monitoring technique; -Counseled to monitor BP at home weekly, document, and provide log at future appointments -Counseled on diet and exercise extensively Educated on benefits of blood pressure control for kidney  protection.   Hyperlipidemia: (LDL goal < 100) The ASCVD Risk score (Arnett DK, et al., 2019) failed to calculate for the following reasons:   The 2019 ASCVD risk score is only valid for ages 4 to 51 Lab Results  Component Value Date   CHOL 216 (H) 02/24/2021   CHOL 329 (H) 08/25/2020   CHOL 291 (H) 02/24/2020   Lab Results  Component Value Date   HDL 61 02/24/2021   HDL 82 08/25/2020   HDL 69 02/24/2020   Lab Results  Component Value Date   LDLCALC 123 (H) 02/24/2021   LDLCALC 224 (H) 08/25/2020   LDLCALC 198 (H) 02/24/2020   Lab Results  Component Value Date   TRIG 186 (H) 02/24/2021   TRIG 129 08/25/2020   TRIG 134 02/24/2020   Lab Results  Component Value Date   CHOLHDL 3.5 02/24/2021   CHOLHDL 4.0 08/25/2020   CHOLHDL 4.2 02/24/2020  No results found for: LDLDIRECT -Uncontrolled -Current treatment: Fish oil 1000 mg 2 capsules daily  Lecithin granules daily Flaxseed oil daily  -Medications previously tried: statins, zetia (stomach problems - stopped February) -Current dietary patterns: eats at home mainly. Potatoes, chicken and oatmeal are most common foods that she can tolerate.   -Current exercise habits: walks to mail box and  -Educated on Cholesterol goals;  Benefits of statin for ASCVD risk reduction; Importance of limiting foods high in cholesterol; Exercise goal of 150 minutes per week; -Counseled on diet and exercise extensively Educated on benefits of cholesterol management and options of non-statin medications. Patient reports that her stomach is very sensitive to medications and she is unable to tolerate many things.  Patient declines Repatha or Nexletol.   Asthma (Goal: control symptoms and prevent exacerbations) -  Controlled -Current treatment  Spiriva 18 mcg daily prn Symbicort 1 puff daily prn  Albuterol inhaler 2 puffs every 6 hours prn wheezing -Medications previously tried: none reported  -Exacerbations requiring treatment in last 6  months: none -Patient denies consistent use of maintenance inhaler -Frequency of rescue inhaler use: rarely -Counseled on Proper inhaler technique; -Assessed patient's breathing. She reports asthma flares with allergy season. She reports managing well currently and rarely using inhalers.  October 2022: Asked patient about med cost, she told me the only med she pays for is Symbicort. I explained the process of PAP and how I could try to fill one out for her, she adamantly denied it. I asked her multiple times and she denied it  Osteoporosis / Osteopenia (Goal reduce risk of osteoporotic fracture) -Uncontrolled -Last DEXA Scan: 04/13/2020   T-Score femoral neck: -2.7  T-Score forearm radius: -2.6   -Patient is a candidate for pharmacologic treatment due to T-Score < -2.5 in femoral neck -Current treatment  Vitamin d 5000 units daily  -Medications previously tried:  calcium (stopped due to kidneys) -Recommend [phone] units of vitamin D daily. Recommend 1200 mg of calcium daily from dietary and supplemental sources. Recommend weight-bearing and muscle strengthening exercises for building and maintaining bone density. -Counseled on diet and exercise extensively Recommended to continue current medication. Patient declines Prolia due to sensitivity to medications and she is unable to take calcium due to kidneys.   Insomnia (Goal: improve sleep) -Controlled (Sleeping 8 hours/night) -Current treatment  Trazodone 50 mg daily  -Medications previously tried: none reported  -Recommended to continue current medication   Constipation (Goal: improve bowel regularity) -Controlled -Current treatment  miralax daily prn Senokot daily  -Medications previously tried: none reported  -Recommended to continue current medication  Depression/Anxiety -Controlled -Current treatment: Duloxetine 20mg  BID -Medications previously tried/failed: N/A -PHQ9:  Depression screen [facility] 2/9 03/29/2021 02/27/2021  02/24/2021  Decreased Interest 0 0 0  Down, Depressed, Hopeless 0 0 0  PHQ - 2 Score 0 0 0  Altered sleeping 0 0 -  Tired, decreased energy 3 3 -  Change in appetite 0 0 -  Feeling bad or failure about yourself  0 0 -  Trouble concentrating 0 0 -  Moving slowly or fidgety/restless 0 0 -  Suicidal thoughts 0 0 -  PHQ-9 Score 3 3 -  Difficult doing work/chores Not difficult at all Not difficult at all -  -GAD7: No flowsheet data found. -Educated on Benefits of medication for symptom control -Recommended to continue current medication   Health Maintenance -Vaccine gaps:  Shingles shot made her sick in March so won't be getting the second shot. Due for COVID second booster but patient declines.  -Current therapy:  acetaminophen 650 mg twice daily for hip pain  b complex vitamins daily Loratadine 10 mg daily prn  Miralax daily prn  Turmeric daily Vitamin E 400 units daily  Coenzyme q-10 30 mg daily  Duloxetine 20 mg daily for night sweats Women's alive 50+ daily (started 3 days ago) -Educated on Herbal supplement research is limited and benefits usually cannot be proven -Patient is satisfied with current therapy and denies issues -Counseled on diet and exercise extensively   Patient Goals/Self-Care Activities Patient will:  - take medications as prescribed focus on medication adherence by using pill box check blood pressure weekly, document, and provide at future appointments target a minimum of 150 minutes of moderate intensity exercise weekly engage in dietary modifications by limiting sodium, drinking plenty of water  and consuming heart healthy foods as tolerated with stomach.   Follow Up Plan: Telephone follow up appointment with care management team member scheduled for: July 2023        The patient verbalized understanding of instructions, educational materials, and care plan provided today and declined offer to receive copy of patient instructions, educational  materials, and care plan.  The pharmacy team will reach out to the patient again over the next 90 days.   Lane Hacker, [facility]

## 2021-07-24 DIAGNOSIS — I129 Hypertensive chronic kidney disease with stage 1 through stage 4 chronic kidney disease, or unspecified chronic kidney disease: Secondary | ICD-10-CM

## 2021-07-24 DIAGNOSIS — E7849 Other hyperlipidemia: Secondary | ICD-10-CM | POA: Diagnosis not present

## 2021-07-24 DIAGNOSIS — E782 Mixed hyperlipidemia: Secondary | ICD-10-CM | POA: Diagnosis not present

## 2021-07-24 DIAGNOSIS — N184 Chronic kidney disease, stage 4 (severe): Secondary | ICD-10-CM

## 2021-07-24 DIAGNOSIS — E039 Hypothyroidism, unspecified: Secondary | ICD-10-CM | POA: Diagnosis not present

## 2021-07-31 DIAGNOSIS — J45901 Unspecified asthma with (acute) exacerbation: Secondary | ICD-10-CM | POA: Diagnosis not present

## 2021-08-04 ENCOUNTER — Telehealth: Payer: Self-pay

## 2021-08-04 NOTE — Chronic Care Management (AMB) (Signed)
Chronic Care Management Pharmacy Assistant   Name: Valerie Kelley  MRN: [id number] DOB: [birth year]   Reason for Encounter: General Adherence Call     Recent office visits:  None since 07/17/21  Recent consult visits:  None since 07/17/21  Hospital visits:  None since 07/17/21  Medications: Outpatient Encounter Medications as of 08/04/2021  Medication Sig   ACETAMINOPHEN PO Take 650 mg by mouth in the morning and at bedtime. For pain p   albuterol (PROVENTIL HFA;VENTOLIN HFA) 108 (90 BASE) MCG/ACT inhaler Inhale 2 puffs into the lungs every 6 (six) hours as needed for wheezing. For wheezing   b complex vitamins capsule Take 1 capsule by mouth daily.   budesonide-formoterol (SYMBICORT) 160-4.5 MCG/ACT inhaler Inhale 1 puff into the lungs daily as needed.   Cholecalciferol (VITAMIN D3) 125 MCG (5000 UT) TBDP Take 5,000 Units by mouth daily.   cimetidine (TAGAMET) 300 MG tablet Take 300 mg by mouth daily.   co-enzyme Q-10 30 MG capsule Take 30 mg by mouth daily.   DULoxetine (CYMBALTA) 20 MG capsule TAKE 1 CAPSULE BY MOUTH TWICE A DAY   EnovaRX-Naproxen 10 % CREA Apply 1 application topically 4 (four) times daily as needed (joint pain).   fish oil-omega-3 fatty acids 1000 MG capsule Take 2 capsules by mouth daily. Patient using wild alaskan salmon product with 2400 mg   Flaxseed Oil OIL by Does not apply route daily.   Glucosamine Sulfate 1000 MG CAPS Take 1 capsule by mouth daily.   Lecithin GRAN by Does not apply route daily.   levothyroxine (SYNTHROID) 25 MCG tablet TAKE 1 TABLET BY MOUTH EVERY DAY BEFORE BREAKFAST   loratadine (CLARITIN) 10 MG tablet Take 10 mg by mouth daily as needed.   Multiple Vitamins-Minerals (ALIVE WOMENS 50+) TABS Take 1 tablet by mouth daily at 12 noon.   polyethylene glycol (MIRALAX / GLYCOLAX) packet Take 51 g by mouth daily as needed. Takes 3 capfuls daily   senna (SENOKOT) 8.6 MG tablet Take 1 tablet by mouth daily.   tiotropium (SPIRIVA) 18  MCG inhalation capsule Place 18 mcg into inhaler and inhale daily as needed.   traZODone (DESYREL) 50 MG tablet Take 3 tablets (150 mg total) by mouth at bedtime.   TURMERIC PO Take 1 capsule by mouth daily.   vitamin E 400 UNIT capsule Take 400 Units by mouth daily.   No facility-administered encounter medications on file as of 08/04/2021.   Contacted Valerie Kelley for general disease state and medication adherence call.   Patient is not > 5 days past due for refill on the following medications per chart history:  [facility] Medications: Medication Name/mg Last Fill Days Supply None    What concerns do you have about your medications? Pt has no concerns   The patient reports the following side effects with her medications. Benzonatate 100 mg made her fee sick on her stomach and the urgent care marked it off to not give her anymore   How often do you forget or accidentally miss a dose? Never  Do you use a pillbox? Yes  Are you having any problems getting your medications from your pharmacy? No  Has the cost of your medications been a concern? No  Since last visit with CPP, no interventions have been made:   The patient has not had an ED visit since last contact.   The patient reports the following problems with their health. Pt went to the urgent care for an URI  and is on medications currently.     Care Gaps: Last annual wellness visit? 12/31/30 If applicable: N/A Last eye exam / retinopathy screening? Diabetic foot exam?    Valerie Kelley, [facility] Pharmacist Assistant  [phone]

## 2021-08-10 DIAGNOSIS — Z20828 Contact with and (suspected) exposure to other viral communicable diseases: Secondary | ICD-10-CM | POA: Diagnosis not present

## 2021-08-15 DIAGNOSIS — Z23 Encounter for immunization: Secondary | ICD-10-CM | POA: Diagnosis not present

## 2021-08-24 ENCOUNTER — Encounter: Payer: Self-pay | Admitting: Family Medicine

## 2021-08-24 ENCOUNTER — Ambulatory Visit (INDEPENDENT_AMBULATORY_CARE_PROVIDER_SITE_OTHER): Payer: Medicare Other | Admitting: Family Medicine

## 2021-08-24 ENCOUNTER — Other Ambulatory Visit: Payer: Self-pay

## 2021-08-24 VITALS — BP 148/68 | HR 80 | Temp 96.1°F | Resp 18 | Ht <= 58 in | Wt 89.2 lb

## 2021-08-24 DIAGNOSIS — N1832 Chronic kidney disease, stage 3b: Secondary | ICD-10-CM

## 2021-08-24 DIAGNOSIS — E7849 Other hyperlipidemia: Secondary | ICD-10-CM

## 2021-08-24 DIAGNOSIS — K5904 Chronic idiopathic constipation: Secondary | ICD-10-CM | POA: Diagnosis not present

## 2021-08-24 DIAGNOSIS — E039 Hypothyroidism, unspecified: Secondary | ICD-10-CM | POA: Diagnosis not present

## 2021-08-24 DIAGNOSIS — J4540 Moderate persistent asthma, uncomplicated: Secondary | ICD-10-CM

## 2021-08-24 DIAGNOSIS — M810 Age-related osteoporosis without current pathological fracture: Secondary | ICD-10-CM

## 2021-08-24 NOTE — Assessment & Plan Note (Signed)
The current medical regimen is effective;  continue present plan and medications. Recommend use ventolin prior to exercise.

## 2021-08-24 NOTE — Assessment & Plan Note (Signed)
Check lipid.  Iikely anticipate it being too high. Consider repatha.

## 2021-08-24 NOTE — Progress Notes (Signed)
Subjective:  Patient ID: Valerie Kelley, female    DOB: 11-26-1933  Age: 87 y.o. MRN: [id number]  Chief Complaint  Patient presents with   Chronic Kidney Disease    HPI Patient is an 87 year old white female who presents for chronic follow-up.  Past medical history includes hypertension with chronic kidney disease, hyperlipidemia (familial), chronic insomnia, chronic pain (arthritis in numerous joints and neck and back pain), GERD, hypothyroidism, and osteoporosis.  Hypothyroidism: Currently on Synthroid 25 mcg once daily in AM.  Last labs were drawn 1 month ago and were therapeutic.  Osteoporosis: Currently on vitamin D supplement.  I had recommended Prolia when her bone density came back earlier this year.  She was concerned about side effects at the time.  Insomnia: Currently on trazodone 50 mg 3 tablets at night. Chronic idiopathic constipation: Currently on Senokot once daily and takes MiraLAX as needed.  Asthma: Currently on Symbicort 2 puffs twice daily and uses Ventolin as needed.  Patient does have shortness of breath with exertion.  Patient is on duloxetine 20 mg once daily initially given for hot flashes.  GERD: Currently on Tagamet 300 mg once daily.  Joint pain: Currently on glucosamine/chondroitin, naproxen cream, and turmeric.  Patient is unable to take NSAIDs due to her chronic kidney disease.  Hyperlipidemia (familial) patient takes coenzyme every 10, she alternates flaxseed oil with over-the-counter fish oil every other day.  Patient is intolerant to statins, Zetia, WelChol.      Current Outpatient Medications on File Prior to Visit  Medication Sig Dispense Refill   ACETAMINOPHEN PO Take 650 mg by mouth in the morning and at bedtime. For pain p     albuterol (PROVENTIL HFA;VENTOLIN HFA) 108 (90 BASE) MCG/ACT inhaler Inhale 2 puffs into the lungs every 6 (six) hours as needed for wheezing. For wheezing     b complex vitamins capsule Take 1 capsule by mouth daily.      budesonide-formoterol (SYMBICORT) 160-4.5 MCG/ACT inhaler Inhale 1 puff into the lungs daily as needed.     Cholecalciferol (VITAMIN D3) 125 MCG (5000 UT) TBDP Take 5,000 Units by mouth daily.     cimetidine (TAGAMET) 300 MG tablet Take 300 mg by mouth daily.     co-enzyme Q-10 30 MG capsule Take 30 mg by mouth daily.     DULoxetine (CYMBALTA) 20 MG capsule TAKE 1 CAPSULE BY MOUTH TWICE A DAY 180 capsule 1   EnovaRX-Naproxen 10 % CREA Apply 1 application topically 4 (four) times daily as needed (joint pain). 120 g 3   fish oil-omega-3 fatty acids 1000 MG capsule Take 2 capsules by mouth daily. Patient using wild alaskan salmon product with 2400 mg     Flaxseed Oil OIL by Does not apply route daily.     fluticasone (FLONASE) 50 MCG/ACT nasal spray Place into both nostrils.     Glucosamine Sulfate 1000 MG CAPS Take 1 capsule by mouth daily.     levothyroxine (SYNTHROID) 25 MCG tablet TAKE 1 TABLET BY MOUTH EVERY DAY BEFORE BREAKFAST 90 tablet 1   loratadine (CLARITIN) 10 MG tablet Take 10 mg by mouth daily as needed.     Multiple Vitamins-Minerals (ALIVE WOMENS 50+) TABS Take 1 tablet by mouth daily at 12 noon.     polyethylene glycol (MIRALAX / GLYCOLAX) packet Take 51 g by mouth daily as needed. Takes 3 capfuls daily     senna (SENOKOT) 8.6 MG tablet Take 1 tablet by mouth daily.     SYMBICORT 80-4.5  MCG/ACT inhaler Inhale into the lungs.     traZODone (DESYREL) 50 MG tablet Take 3 tablets (150 mg total) by mouth at bedtime. 270 tablet 1   TURMERIC PO Take 1 capsule by mouth daily.     vitamin E 400 UNIT capsule Take 400 Units by mouth daily.     No current facility-administered medications on file prior to visit.   Past Medical History:  Diagnosis Date   Arthritis    "all over" (04/15/2013)   Asthma    Emphysema    "mild" (04/15/2013)   Exertional shortness of breath    Gastric paresis    GERD (gastroesophageal reflux disease)    H/O hiatal hernia    High cholesterol     Hypothyroidism    Myalgia due to statin    Stage III chronic kidney disease ([place])    Past Surgical History:  Procedure Laterality Date   ABDOMINAL HYSTERECTOMY     REVERSE SHOULDER ARTHROPLASTY Left 04/15/2013   REVERSE SHOULDER ARTHROPLASTY Left 04/15/2013   Procedure: REVERSE SHOULDER ARTHROPLASTY LEFT;  Surgeon: Augustin Schooling, MD;  Location: [facility];  Service: Orthopedics;  Laterality: Left;   SINUS EXPLORATION     TONSILLECTOMY      Family History  Problem Relation Age of Onset   Stroke Mother    Cancer Brother        brain   Cancer Brother        leukemia   Cancer Brother        lung   Social History   Socioeconomic History   Marital status: Widowed    Spouse name: Not on file   Number of children: 2   Years of education: Not on file   Highest education level: Not on file  Occupational History   Occupation: Retired  Tobacco Use   Smoking status: Never   Smokeless tobacco: Never  Vaping Use   Vaping Use: Never used  Substance and Sexual Activity   Alcohol use: No   Drug use: No   Sexual activity: Not Currently  Other Topics Concern   Not on file  Social History Narrative   Not on file   Social Determinants of Health   Financial Resource Strain: Unknown   Difficulty of Paying Living Expenses: Patient refused  Food Insecurity: No Food Insecurity   Worried About Running Out of Food in the Last Year: Never true   [place] in the Last Year: Never true  Transportation Needs: No Transportation Needs   Lack of Transportation (Medical): No   Lack of Transportation (Non-Medical): No  Physical Activity: Insufficiently Active   Days of Exercise per Week: 5 days   Minutes of Exercise per Session: 10 min  Stress: Not on file  Social Connections: Not on file    Review of Systems  Constitutional:  Positive for fatigue. Negative for chills and fever.  HENT:  Negative for congestion, rhinorrhea and sore throat.   Respiratory:  Positive for shortness of  breath. Negative for cough.   Cardiovascular:  Negative for chest pain.  Gastrointestinal:  Negative for abdominal pain, constipation, diarrhea, nausea and vomiting.  Genitourinary:  Negative for dysuria and urgency.  Musculoskeletal:  Positive for arthralgias and back pain. Negative for myalgias.  Neurological:  Positive for weakness. Negative for dizziness, light-headedness and headaches.  Psychiatric/Behavioral:  Negative for dysphoric mood. The patient is not nervous/anxious.     Objective:  BP (!) 148/68   Pulse 80   Temp (!)  96.1 F (35.6 C)   Resp 18   Ht 4\' 10"  (1.473 m)   Wt 89 lb 3.2 oz (40.5 kg)   BMI 18.64 kg/m   BP/Weight 08/24/2021 0/16/0109 11/24/3555  Systolic BP 322 025 427  Diastolic BP 68 60 52  Wt. (Lbs) 89.2 91.2 92.2  BMI 18.64 19.06 19.27    Physical Exam Vitals reviewed.  Constitutional:      Appearance: Normal appearance. She is normal weight.  Neck:     Vascular: No carotid bruit.  Cardiovascular:     Rate and Rhythm: Normal rate and regular rhythm.     Heart sounds: Normal heart sounds.  Pulmonary:     Effort: Pulmonary effort is normal. No respiratory distress.     Breath sounds: Normal breath sounds.  Abdominal:     General: Abdomen is flat. Bowel sounds are normal.     Palpations: Abdomen is soft.     Tenderness: There is no abdominal tenderness.  Neurological:     Mental Status: She is alert and oriented to person, place, and time.  Psychiatric:        Mood and Affect: Mood normal.        Behavior: Behavior normal.    Diabetic Foot Exam - Simple   No data filed      Lab Results  Component Value Date   WBC 6.3 06/01/2021   HGB 12.2 06/01/2021   HCT 37.4 06/01/2021   PLT 278 06/01/2021   GLUCOSE 66 06/01/2021   CHOL 216 (H) 02/24/2021   TRIG 186 (H) 02/24/2021   HDL 61 02/24/2021   LDLCALC 123 (H) 02/24/2021   ALT 14 06/01/2021   AST 20 06/01/2021   NA 139 06/01/2021   K 4.4 06/01/2021   CL 101 06/01/2021   CREATININE  1.35 (H) 06/01/2021   BUN 15 06/01/2021   CO2 25 06/01/2021   TSH 6.580 (H) 06/01/2021      Assessment & Plan:   Problem List Items Addressed This Visit       Respiratory   Moderate persistent asthma without complication    The current medical regimen is effective;  continue present plan and medications. Recommend use ventolin prior to exercise.       Relevant Medications   SYMBICORT 80-4.5 MCG/ACT inhaler     Digestive   Chronic idiopathic constipation    Continue sennakot and miralax        Endocrine   Hypothyroidism    The current medical regimen is effective;  continue present plan and medications.         Musculoskeletal and Integument   Osteoporosis, post-menopausal - Primary    Prolia education given.        Genitourinary   Chronic kidney disease, stage 3b ([place])     Other   Familial hyperlipidemia    Check lipid.  Iikely anticipate it being too high. Consider repatha.       Relevant Orders   CBC with Differential/Platelet   Comprehensive metabolic panel   Lipid panel  .  No orders of the defined types were placed in this encounter.   Orders Placed This Encounter  Procedures   CBC with Differential/Platelet   Comprehensive metabolic panel   Lipid panel     Follow-up: Return in about 6 months (around 02/22/2022) for chronic fasting, awv (due aften 03/30/2022).  An After Visit Summary was printed and given to the patient.  [name], MD [facility] [phone]

## 2021-08-24 NOTE — Assessment & Plan Note (Signed)
Continue sennakot and miralax

## 2021-08-24 NOTE — Assessment & Plan Note (Signed)
The current medical regimen is effective;  continue present plan and medications.  

## 2021-08-24 NOTE — Assessment & Plan Note (Signed)
Prolia education given.

## 2021-08-25 LAB — LIPID PANEL
Chol/HDL Ratio: 4.4 ratio (ref 0.0–4.4)
Cholesterol, Total: 300 mg/dL — ABNORMAL HIGH (ref 100–199)
HDL: 68 mg/dL (ref >39)
LDL Chol Calc (NIH): 196 mg/dL — ABNORMAL HIGH (ref 0–99)
Triglycerides: 191 mg/dL — ABNORMAL HIGH (ref 0–149)
VLDL Cholesterol Cal: 36 mg/dL (ref 5–40)

## 2021-08-25 LAB — CBC WITH DIFFERENTIAL/PLATELET
Basophils Absolute: 0 10*3/uL (ref 0.0–0.2)
Basos: 0 %
EOS (ABSOLUTE): 0.1 10*3/uL (ref 0.0–0.4)
Eos: 1 %
Hematocrit: 41.9 % (ref 34.0–46.6)
Hemoglobin: 13.5 g/dL (ref 11.1–15.9)
Immature Grans (Abs): 0 10*3/uL (ref 0.0–0.1)
Immature Granulocytes: 0 %
Lymphocytes Absolute: 2.3 10*3/uL (ref 0.7–3.1)
Lymphs: 34 %
MCH: 29.5 pg (ref 26.6–33.0)
MCHC: 32.2 g/dL (ref 31.5–35.7)
MCV: 92 fL (ref 79–97)
Monocytes Absolute: 0.8 10*3/uL (ref 0.1–0.9)
Monocytes: 12 %
Neutrophils Absolute: 3.5 10*3/uL (ref 1.4–7.0)
Neutrophils: 53 %
Platelets: 269 10*3/uL (ref 150–450)
RBC: 4.58 x10E6/uL (ref 3.77–5.28)
RDW: 14.6 % (ref 11.7–15.4)
WBC: 6.7 10*3/uL (ref 3.4–10.8)

## 2021-08-25 LAB — COMPREHENSIVE METABOLIC PANEL
ALT: 18 IU/L (ref 0–32)
AST: 22 IU/L (ref 0–40)
Albumin/Globulin Ratio: 2 (ref 1.2–2.2)
Albumin: 4.9 g/dL — ABNORMAL HIGH (ref 3.6–4.6)
Alkaline Phosphatase: 87 IU/L (ref 44–121)
BUN/Creatinine Ratio: 12 (ref 12–28)
BUN: 16 mg/dL (ref 8–27)
Bilirubin Total: 0.4 mg/dL (ref 0.0–1.2)
CO2: 21 mmol/L (ref 20–29)
Calcium: 9.4 mg/dL (ref 8.7–10.3)
Chloride: 103 mmol/L (ref 96–106)
Creatinine, Ser: 1.33 mg/dL — ABNORMAL HIGH (ref 0.57–1.00)
Globulin, Total: 2.4 g/dL (ref 1.5–4.5)
Glucose: 89 mg/dL (ref 70–99)
Potassium: 4.3 mmol/L (ref 3.5–5.2)
Sodium: 137 mmol/L (ref 134–144)
Total Protein: 7.3 g/dL (ref 6.0–8.5)
eGFR: 39 mL/min/{1.73_m2} — ABNORMAL LOW (ref >59)

## 2021-08-25 LAB — CARDIOVASCULAR RISK ASSESSMENT

## 2021-08-25 NOTE — Progress Notes (Signed)
Blood count normal.  Liver function normal.  Kidney function normal.  LDL VERY HIGH. RECOMMEND START ON REPATHA 140 MG EVERY 2 WEEKS.  If pt refuses, please recommend referral to cardiology.

## 2021-08-28 ENCOUNTER — Other Ambulatory Visit: Payer: Self-pay

## 2021-08-28 MED ORDER — AMLODIPINE BESYLATE 2.5 MG PO TABS: 2.5000 mg | ORAL_TABLET | Freq: Every day | ORAL | 1 refills | Status: DC

## 2021-08-28 NOTE — Telephone Encounter (Signed)
Valerie Kelley called to report that her bp has been elevated since she was seen in the office.  Her bp has bp 180/72, 160/71, 140/66, 162/74, 155/70 pulse 65.  She wants to try amlodipine 2.5 mg that she has taken in the past.  Dr. Tobie Poet approved the amlodipine 2.5 mg to be taken daily and she was instructed to continue monitoring her bp and call us back in 2 weeks or before if her bp does not improve.

## 2021-08-31 NOTE — Progress Notes (Signed)
Acknowledged, but do not agree with choice.

## 2021-09-26 ENCOUNTER — Other Ambulatory Visit: Payer: Self-pay

## 2021-09-26 DIAGNOSIS — J301 Allergic rhinitis due to pollen: Secondary | ICD-10-CM | POA: Diagnosis not present

## 2021-09-26 DIAGNOSIS — J4520 Mild intermittent asthma, uncomplicated: Secondary | ICD-10-CM | POA: Diagnosis not present

## 2021-09-26 MED ORDER — AMLODIPINE BESYLATE 2.5 MG PO TABS: 2.5000 mg | ORAL_TABLET | Freq: Every day | ORAL | 1 refills | Status: DC

## 2021-09-26 MED ORDER — EZETIMIBE 10 MG PO TABS: 10.0000 mg | ORAL_TABLET | Freq: Every day | ORAL | 1 refills | Status: DC

## 2021-10-06 ENCOUNTER — Telehealth: Payer: Self-pay

## 2021-10-06 DIAGNOSIS — G5603 Carpal tunnel syndrome, bilateral upper limbs: Secondary | ICD-10-CM | POA: Diagnosis not present

## 2021-10-06 NOTE — Progress Notes (Signed)
Chronic Care Management Pharmacy Assistant   Name: Valerie Kelley  MRN: [id number] DOB: Aug 15, 1934   Reason for Encounter: General Adherence Call    Recent office visits:  08/24/21 [name] MD. Seen for CKD. D/C Spiriva inhaler 26mcg. No other med changes.   Recent consult visits:  None  Hospital visits:  None   Medications: Outpatient Encounter Medications as of 10/06/2021  Medication Sig   ACETAMINOPHEN PO Take 650 mg by mouth in the morning and at bedtime. For pain p   albuterol (PROVENTIL HFA;VENTOLIN HFA) 108 (90 BASE) MCG/ACT inhaler Inhale 2 puffs into the lungs every 6 (six) hours as needed for wheezing. For wheezing   amLODipine (NORVASC) 2.5 MG tablet Take 1 tablet (2.5 mg total) by mouth daily.   b complex vitamins capsule Take 1 capsule by mouth daily.   budesonide-formoterol (SYMBICORT) 160-4.5 MCG/ACT inhaler Inhale 1 puff into the lungs daily as needed.   Cholecalciferol (VITAMIN D3) 125 MCG (5000 UT) TBDP Take 5,000 Units by mouth daily.   cimetidine (TAGAMET) 300 MG tablet Take 300 mg by mouth daily.   co-enzyme Q-10 30 MG capsule Take 30 mg by mouth daily.   DULoxetine (CYMBALTA) 20 MG capsule TAKE 1 CAPSULE BY MOUTH TWICE A DAY   EnovaRX-Naproxen 10 % CREA Apply 1 application topically 4 (four) times daily as needed (joint pain).   ezetimibe (ZETIA) 10 MG tablet Take 1 tablet (10 mg total) by mouth daily.   fish oil-omega-3 fatty acids 1000 MG capsule Take 2 capsules by mouth daily. Patient using wild alaskan salmon product with 2400 mg   Flaxseed Oil OIL by Does not apply route daily.   fluticasone (FLONASE) 50 MCG/ACT nasal spray Place into both nostrils.   Glucosamine Sulfate 1000 MG CAPS Take 1 capsule by mouth daily.   levothyroxine (SYNTHROID) 25 MCG tablet TAKE 1 TABLET BY MOUTH EVERY DAY BEFORE BREAKFAST   loratadine (CLARITIN) 10 MG tablet Take 10 mg by mouth daily as needed.   Multiple Vitamins-Minerals (ALIVE WOMENS 50+) TABS Take 1 tablet by  mouth daily at 12 noon.   polyethylene glycol (MIRALAX / GLYCOLAX) packet Take 51 g by mouth daily as needed. Takes 3 capfuls daily   senna (SENOKOT) 8.6 MG tablet Take 1 tablet by mouth daily.   SYMBICORT 80-4.5 MCG/ACT inhaler Inhale into the lungs.   traZODone (DESYREL) 50 MG tablet Take 3 tablets (150 mg total) by mouth at bedtime.   TURMERIC PO Take 1 capsule by mouth daily.   vitamin E 400 UNIT capsule Take 400 Units by mouth daily.   No facility-administered encounter medications on file as of 10/06/2021.    Contacted [name] for general disease state and medication adherence call.   Patient is not > 5 days past due for refill on the following medications per chart history:  Star Medications: Medication Name/mg Last Fill Days Supply None noted    What concerns do you have about your medications? Pt stated her arthritis seems to have increased. She stated her legs are wobbly and feels she is going to fall. She stated it has gotten worse over the last 2 months and was wondering if its the Zetia causing this. She started taking this around December.  She stated she is having a lot of leg pain as well. She is having to take Tylenol to help with pain. Wants to know what to do. Can you please advise and pt wants a follow up on this.  The patient denies side effects with her medications.   How often do you forget or accidentally miss a dose? Never  Do you use a pillbox? Yes  Are you having any problems getting your medications from your pharmacy? No  Has the cost of your medications been a concern? No  Since last visit with CPP, no interventions have been made:   The patient has not had an ED visit since last contact.   The patient reports the following problems with their health. She stated she is having to have surgery on both her hands due to Carpal Tunnel  she reports the following  concerns or questions for [name] at this time. Wants a follow up on what could  be causing her increased muscle pain    Care Gaps: Last annual wellness visit? 11/23/52 If applicable: N/A Last eye exam / retinopathy screening? Diabetic foot exam?    [name], [place] Pharmacist Assistant  [phone]

## 2021-10-06 NOTE — Telephone Encounter (Signed)
Stopped taking Ezetimibe due to cramps. Does not want to inject herself for Cholesterol therapy. Will let PCP know

## 2021-11-01 ENCOUNTER — Telehealth: Payer: Self-pay

## 2021-11-01 DIAGNOSIS — E039 Hypothyroidism, unspecified: Secondary | ICD-10-CM | POA: Diagnosis not present

## 2021-11-01 NOTE — Telephone Encounter (Signed)
Patient calling as she has been having hypertension. Home readings for the last three days include 151/70, 162/70, and 161/78. Patient went to endo appointment today where BP was 178/74, Dr at appointment suggested patient reach out to [place] for adjustment of BP medication. Has been having some headaches, denies chest pain, chest tightness, and other symptoms.   Currently takes amlodipine 2.5 mg.  If adjusted needs script sent to [facility].   Valerie Kelley 11/01/21 4:35 PM

## 2021-11-02 ENCOUNTER — Ambulatory Visit (INDEPENDENT_AMBULATORY_CARE_PROVIDER_SITE_OTHER): Payer: Medicare Other | Admitting: Physician Assistant

## 2021-11-02 ENCOUNTER — Other Ambulatory Visit: Payer: Self-pay

## 2021-11-02 ENCOUNTER — Encounter: Payer: Self-pay | Admitting: Physician Assistant

## 2021-11-02 VITALS — BP 152/62 | HR 76 | Temp 98.3°F | Ht <= 58 in | Wt 94.0 lb

## 2021-11-02 DIAGNOSIS — E039 Hypothyroidism, unspecified: Secondary | ICD-10-CM

## 2021-11-02 DIAGNOSIS — I10 Essential (primary) hypertension: Secondary | ICD-10-CM

## 2021-11-02 MED ORDER — AMLODIPINE BESYLATE 5 MG PO TABS
5.0000 mg | ORAL_TABLET | Freq: Every day | ORAL | 0 refills | Status: DC
Start: 2021-11-02 — End: 2021-11-09

## 2021-11-02 NOTE — Telephone Encounter (Signed)
Appointment has been made.   Valerie Kelley, [place] 11/02/21 7:50 AM

## 2021-11-02 NOTE — Progress Notes (Signed)
Subjective:  Patient ID: Valerie Kelley, female    DOB: Oct 27, 1933  Age: 88 y.o. MRN: [id number]  Chief Complaint  Patient presents with   Hypertension    HPI  Pt in today to check blood pressure - states she has been checking at home and it has been ranging 170s/70-80s and yesterday was at endocrinologist and it was elevated at 176/74 She states they told her to increase her norvasc from 2.5 to 10 but I recommend not to increase that much at once and will do slight increase of her medication She denies chest pain, shortness of breath or edema She is having carpal tunnel release done tomorrow by [facility]  Pt also states that yesterday she did not have TSH drawn at endocrine office and wants done here today to be sent to her specialist Current Outpatient Medications on File Prior to Visit  Medication Sig Dispense Refill   ACETAMINOPHEN PO Take 650 mg by mouth in the morning and at bedtime. For pain p     albuterol (PROVENTIL HFA;VENTOLIN HFA) 108 (90 BASE) MCG/ACT inhaler Inhale 2 puffs into the lungs every 6 (six) hours as needed for wheezing. For wheezing     b complex vitamins capsule Take 1 capsule by mouth daily.     budesonide-formoterol (SYMBICORT) 160-4.5 MCG/ACT inhaler Inhale 1 puff into the lungs daily as needed.     Cholecalciferol (VITAMIN D3) 125 MCG (5000 UT) TBDP Take 5,000 Units by mouth daily.     cimetidine (TAGAMET) 300 MG tablet Take 300 mg by mouth daily.     co-enzyme Q-10 30 MG capsule Take 30 mg by mouth daily.     DULoxetine (CYMBALTA) 20 MG capsule TAKE 1 CAPSULE BY MOUTH TWICE A DAY 180 capsule 1   EnovaRX-Naproxen 10 % CREA Apply 1 application topically 4 (four) times daily as needed (joint pain). 120 g 3   ezetimibe (ZETIA) 10 MG tablet Take 1 tablet (10 mg total) by mouth daily. 90 tablet 1   fish oil-omega-3 fatty acids 1000 MG capsule Take 2 capsules by mouth daily. Patient using wild alaskan salmon product with 2400 mg     Flaxseed Oil OIL by  Does not apply route daily.     fluticasone (FLONASE) 50 MCG/ACT nasal spray Place into both nostrils.     Glucosamine Sulfate 1000 MG CAPS Take 1 capsule by mouth daily.     levothyroxine (SYNTHROID) 25 MCG tablet TAKE 1 TABLET BY MOUTH EVERY DAY BEFORE BREAKFAST 90 tablet 1   loratadine (CLARITIN) 10 MG tablet Take 10 mg by mouth daily as needed.     Multiple Vitamins-Minerals (ALIVE WOMENS 50+) TABS Take 1 tablet by mouth daily at 12 noon.     polyethylene glycol (MIRALAX / GLYCOLAX) packet Take 51 g by mouth daily as needed. Takes 3 capfuls daily     senna (SENOKOT) 8.6 MG tablet Take 1 tablet by mouth daily.     SYMBICORT 80-4.5 MCG/ACT inhaler Inhale into the lungs.     traZODone (DESYREL) 50 MG tablet Take 3 tablets (150 mg total) by mouth at bedtime. 270 tablet 1   TURMERIC PO Take 1 capsule by mouth daily.     vitamin E 400 UNIT capsule Take 400 Units by mouth daily.     [DISCONTINUED] amLODipine (NORVASC) 2.5 MG tablet Take 1 tablet (2.5 mg total) by mouth daily. 90 tablet 1   No current facility-administered medications on file prior to visit.   Past Medical History:  Diagnosis Date   Arthritis    "all over" (04/15/2013)   Asthma    Emphysema    "mild" (04/15/2013)   Exertional shortness of breath    Gastric paresis    GERD (gastroesophageal reflux disease)    H/O hiatal hernia    High cholesterol    Hypothyroidism    Myalgia due to statin    Stage III chronic kidney disease [facility])    Past Surgical History:  Procedure Laterality Date   ABDOMINAL HYSTERECTOMY     REVERSE SHOULDER ARTHROPLASTY Left 04/15/2013   REVERSE SHOULDER ARTHROPLASTY Left 04/15/2013   Procedure: REVERSE SHOULDER ARTHROPLASTY LEFT;  Surgeon: Augustin Schooling, MD;  Location: [facility];  Service: Orthopedics;  Laterality: Left;   SINUS EXPLORATION     TONSILLECTOMY      Family History  Problem Relation Age of Onset   Stroke Mother    Cancer Brother        brain   Cancer Brother        leukemia    Cancer Brother        lung   Social History   Socioeconomic History   Marital status: Widowed    Spouse name: Not on file   Number of children: 2   Years of education: Not on file   Highest education level: Not on file  Occupational History   Occupation: Retired  Tobacco Use   Smoking status: Never   Smokeless tobacco: Never  Vaping Use   Vaping Use: Never used  Substance and Sexual Activity   Alcohol use: No   Drug use: No   Sexual activity: Not Currently  Other Topics Concern   Not on file  Social History Narrative   Not on file   Social Determinants of Health   Financial Resource Strain: Unknown   Difficulty of Paying Living Expenses: Patient refused  Food Insecurity: No Food Insecurity   Worried About Running Out of Food in the Last Year: Never true   [place] in the Last Year: Never true  Transportation Needs: No Transportation Needs   Lack of Transportation (Medical): No   Lack of Transportation (Non-Medical): No  Physical Activity: Insufficiently Active   Days of Exercise per Week: 5 days   Minutes of Exercise per Session: 10 min  Stress: Not on file  Social Connections: Not on file    Review of Systems CONSTITUTIONAL: Negative for chills, fatigue, fever, unintentional weight gain and unintentional weight loss.  CARDIOVASCULAR: Negative for chest pain, dizziness, palpitations and pedal edema.  RESPIRATORY: Negative for recent cough and dyspnea.  GASTROINTESTINAL: Negative for abdominal pain, acid reflux symptoms, constipation, diarrhea, nausea and vomiting.    Objective:  BP (!) 152/62 (BP Location: Left Arm, Patient Position: Sitting)    Pulse 76    Temp 98.3 F (36.8 C) (Oral)    Ht 4\' 10"  (1.473 m)    Wt 94 lb (42.6 kg)    SpO2 98%    BMI 19.65 kg/m   BP/Weight 11/02/2021 08/24/2021 2/40/9735  Systolic BP 329 924 268  Diastolic BP 62 68 60  Wt. (Lbs) 94 89.2 91.2  BMI 19.65 18.64 19.06    Physical Exam PHYSICAL EXAM:   VS: BP (!) 152/62  (BP Location: Left Arm, Patient Position: Sitting)    Pulse 76    Temp 98.3 F (36.8 C) (Oral)    Ht 4\' 10"  (1.473 m)    Wt 94 lb (42.6 kg)    SpO2 98%  BMI 19.65 kg/m   GEN: Well nourished, well developed, in no acute distress  Cardiac: RRR; no murmurs, rubs, or gallops,no edema -  Respiratory:  normal respiratory rate and pattern with no distress - normal breath sounds with no rales, rhonchi, wheezes or rubs   Diabetic Foot Exam - Simple   No data filed      Lab Results  Component Value Date   WBC 6.7 08/24/2021   HGB 13.5 08/24/2021   HCT 41.9 08/24/2021   PLT 269 08/24/2021   GLUCOSE 89 08/24/2021   CHOL 300 (H) 08/24/2021   TRIG 191 (H) 08/24/2021   HDL 68 08/24/2021   LDLCALC 196 (H) 08/24/2021   ALT 18 08/24/2021   AST 22 08/24/2021   NA 137 08/24/2021   K 4.3 08/24/2021   CL 103 08/24/2021   CREATININE 1.33 (H) 08/24/2021   BUN 16 08/24/2021   CO2 21 08/24/2021   TSH 6.580 (H) 06/01/2021      Assessment & Plan:   Problem List Items Addressed This Visit       Endocrine   Hypothyroidism   Relevant Orders   TSH   Other Visit Diagnoses     Benign hypertension    -  Primary   Relevant Orders   CBC with Differential/Platelet   Comprehensive metabolic panel   TSH Increase norvasc to 5mg  qd     .  No orders of the defined types were placed in this encounter.   Orders Placed This Encounter  Procedures   CBC with Differential/Platelet   Comprehensive metabolic panel   TSH     Follow-up: Return in about 3 weeks (around 11/23/2021) for follow up with Dr Tobie Poet - .  An After Visit Summary was printed and given to the patient.  Yetta Flock [facility] [phone]

## 2021-11-03 DIAGNOSIS — G5602 Carpal tunnel syndrome, left upper limb: Secondary | ICD-10-CM | POA: Diagnosis not present

## 2021-11-03 LAB — CBC WITH DIFFERENTIAL/PLATELET
Basophils Absolute: 0 10*3/uL (ref 0.0–0.2)
Basos: 0 %
EOS (ABSOLUTE): 0.1 10*3/uL (ref 0.0–0.4)
Eos: 1 %
Hematocrit: 37.4 % (ref 34.0–46.6)
Hemoglobin: 12.3 g/dL (ref 11.1–15.9)
Immature Grans (Abs): 0 10*3/uL (ref 0.0–0.1)
Immature Granulocytes: 0 %
Lymphocytes Absolute: 2.1 10*3/uL (ref 0.7–3.1)
Lymphs: 29 %
MCH: 30.1 pg (ref 26.6–33.0)
MCHC: 32.9 g/dL (ref 31.5–35.7)
MCV: 91 fL (ref 79–97)
Monocytes Absolute: 1 10*3/uL — ABNORMAL HIGH (ref 0.1–0.9)
Monocytes: 14 %
Neutrophils Absolute: 3.9 10*3/uL (ref 1.4–7.0)
Neutrophils: 56 %
Platelets: 265 10*3/uL (ref 150–450)
RBC: 4.09 x10E6/uL (ref 3.77–5.28)
RDW: 13.7 % (ref 11.7–15.4)
WBC: 7 10*3/uL (ref 3.4–10.8)

## 2021-11-03 LAB — COMPREHENSIVE METABOLIC PANEL
ALT: 19 IU/L (ref 0–32)
AST: 27 IU/L (ref 0–40)
Albumin/Globulin Ratio: 2 (ref 1.2–2.2)
Albumin: 4.6 g/dL (ref 3.6–4.6)
Alkaline Phosphatase: 79 IU/L (ref 44–121)
BUN/Creatinine Ratio: 16 (ref 12–28)
BUN: 20 mg/dL (ref 8–27)
Bilirubin Total: 0.3 mg/dL (ref 0.0–1.2)
CO2: 24 mmol/L (ref 20–29)
Calcium: 9.5 mg/dL (ref 8.7–10.3)
Chloride: 100 mmol/L (ref 96–106)
Creatinine, Ser: 1.24 mg/dL — ABNORMAL HIGH (ref 0.57–1.00)
Globulin, Total: 2.3 g/dL (ref 1.5–4.5)
Glucose: 84 mg/dL (ref 70–99)
Potassium: 4.8 mmol/L (ref 3.5–5.2)
Sodium: 137 mmol/L (ref 134–144)
Total Protein: 6.9 g/dL (ref 6.0–8.5)
eGFR: 43 mL/min/{1.73_m2} — ABNORMAL LOW (ref >59)

## 2021-11-03 LAB — TSH: TSH: 2.53 u[IU]/mL (ref 0.450–4.500)

## 2021-11-06 DIAGNOSIS — Z20822 Contact with and (suspected) exposure to covid-19: Secondary | ICD-10-CM | POA: Diagnosis not present

## 2021-11-09 ENCOUNTER — Other Ambulatory Visit: Payer: Self-pay

## 2021-11-09 DIAGNOSIS — I10 Essential (primary) hypertension: Secondary | ICD-10-CM

## 2021-11-09 MED ORDER — DULOXETINE HCL 20 MG PO CPEP: 20.0000 mg | ORAL_CAPSULE | Freq: Two times a day (BID) | ORAL | 1 refills | Status: DC

## 2021-11-09 MED ORDER — ACETAMINOPHEN ER 650 MG PO TBCR: 650.0000 mg | EXTENDED_RELEASE_TABLET | Freq: Three times a day (TID) | ORAL | 0 refills | Status: AC | PRN

## 2021-11-09 MED ORDER — TRAZODONE HCL 50 MG PO TABS: 150.0000 mg | ORAL_TABLET | Freq: Every day | ORAL | 1 refills | Status: DC

## 2021-11-09 MED ORDER — AMLODIPINE BESYLATE 5 MG PO TABS: 5.0000 mg | ORAL_TABLET | Freq: Every day | ORAL | 0 refills | Status: DC

## 2021-11-30 ENCOUNTER — Other Ambulatory Visit: Payer: Self-pay

## 2021-11-30 DIAGNOSIS — I10 Essential (primary) hypertension: Secondary | ICD-10-CM

## 2021-11-30 MED ORDER — AMLODIPINE BESYLATE 5 MG PO TABS: 5.0000 mg | ORAL_TABLET | Freq: Every day | ORAL | 0 refills | Status: DC

## 2021-12-02 DIAGNOSIS — H8110 Benign paroxysmal vertigo, unspecified ear: Secondary | ICD-10-CM | POA: Diagnosis not present

## 2021-12-02 DIAGNOSIS — R42 Dizziness and giddiness: Secondary | ICD-10-CM | POA: Diagnosis not present

## 2021-12-06 ENCOUNTER — Telehealth: Payer: Self-pay

## 2021-12-06 ENCOUNTER — Other Ambulatory Visit: Payer: Self-pay

## 2021-12-06 DIAGNOSIS — I10 Essential (primary) hypertension: Secondary | ICD-10-CM

## 2021-12-06 MED ORDER — AMLODIPINE BESYLATE 5 MG PO TABS: 5.0000 mg | ORAL_TABLET | Freq: Every day | ORAL | 1 refills | Status: DC

## 2021-12-06 NOTE — Progress Notes (Signed)
? ? ?Chronic Care Management ?Pharmacy Assistant  ? ?Name: Valerie Kelley  MRN: [id number] DOB: [birth year] ? ? ?Reason for Encounter: General Adherence Call  ? ?Recent office visits:  ?11/02/21 Valerie Duncans PA-C. Seen for HTN. Changed Amlodipine from 2.'5mg'$  to '5mg'$  daily.  ? ?Recent consult visits:  ?11/13/21 (Endocrinology) Valerie Kelley CMA. Orders Only. Ordered Levothyroxine 79mg.  ? ?11/09/21 (Endocrinology) PTemperance MNew Kelley. Orders Only. Ordered Levothyroxine 257m.  ? ?11/01/21 (Endocrinology) Valerie GreenhouseD. Seen for Hypothyroidism. No med changes.  ? ?Hospital visits:  ?None ? ?Medications: ?Outpatient Encounter Medications as of 12/06/2021  ?Medication Sig  ? acetaminophen (TYLENOL) 650 MG CR tablet Take 1 tablet (650 mg total) by mouth 3 (three) times daily as needed.  ? albuterol (PROVENTIL HFA;VENTOLIN HFA) 108 (90 BASE) MCG/ACT inhaler Inhale 2 puffs into the lungs every 6 (six) hours as needed for wheezing. For wheezing  ? amLODipine (NORVASC) 5 MG tablet Take 1 tablet (5 mg total) by mouth daily.  ? b complex vitamins capsule Take 1 capsule by mouth daily.  ? budesonide-formoterol (SYMBICORT) 160-4.5 MCG/ACT inhaler Inhale 1 puff into the lungs daily as needed.  ? Cholecalciferol (VITAMIN D3) 125 MCG (5000 UT) TBDP Take 5,000 Units by mouth daily.  ? cimetidine (TAGAMET) 300 MG tablet Take 300 mg by mouth daily.  ? co-enzyme Q-10 30 MG capsule Take 30 mg by mouth daily.  ? DULoxetine (CYMBALTA) 20 MG capsule Take 1 capsule (20 mg total) by mouth 2 (two) times daily.  ? EnovaRX-Naproxen 10 % CREA Apply 1 application topically 4 (four) times daily as needed (joint pain).  ? ezetimibe (ZETIA) 10 MG tablet Take 1 tablet (10 mg total) by mouth daily.  ? fish oil-omega-3 fatty acids 1000 MG capsule Take 2 capsules by mouth daily. Patient using wild [place] salmon product with 2400 mg  ? Flaxseed Oil OIL by Does not apply route daily.  ? fluticasone (FLONASE) 50 MCG/ACT nasal spray Place into both nostrils.  ?  Glucosamine Sulfate 1000 MG CAPS Take 1 capsule by mouth daily.  ? levothyroxine (SYNTHROID) 25 MCG tablet TAKE 1 TABLET BY MOUTH EVERY DAY BEFORE BREAKFAST  ? loratadine (CLARITIN) 10 MG tablet Take 10 mg by mouth daily as needed.  ? Multiple Vitamins-Minerals (ALIVE WOMENS 50+) TABS Take 1 tablet by mouth daily at 12 noon.  ? polyethylene glycol (MIRALAX / GLYCOLAX) packet Take 51 g by mouth daily as needed. Takes 3 capfuls daily  ? senna (SENOKOT) 8.6 MG tablet Take 1 tablet by mouth daily.  ? SYMBICORT 80-4.5 MCG/ACT inhaler Inhale into the lungs.  ? traZODone (DESYREL) 50 MG tablet Take 3 tablets (150 mg total) by mouth at bedtime.  ? TURMERIC PO Take 1 capsule by mouth daily.  ? vitamin E 400 UNIT capsule Take 400 Units by mouth daily.  ? ?No facility-administered encounter medications on file as of 12/06/2021.  ? ? ?Contacted [name] general disease state and medication adherence call.  ? ?Patient is not > 5 days past due for refill on the following medications per chart history: ? ?Star Medications: ?Medication Name/mg Last Fill Days Supply ?None noted  ? ?What concerns do you have about your medications? Pt denies any concerns  ? ?The patient denies side effects with her medications.  ? ?How often do you forget or accidentally miss a dose? Never ? ?Do you use a pillbox? Yes ? ?Are you having any problems getting your medications from your pharmacy? No ? ?Has the  cost of your medications been a concern? No ? ?Since last visit with CPP, no interventions have been made:  ? ?The patient has had an ED visit since last contact. Pt went to the urgent care ([facility])  this past Saturday due to Vertigo. Pt stated she has been unsteady on her feet.  They put her on Meclizine and Ondansetron '4mg'$ .  ? ?The patient reports the following problems with their health. Pt stated she is still struggling with her Arthritis that she takes Tylenol and Aleve cream gel to help. ? ?she denies  concerns or  questions for [name], at this time.  ? ?Patient states BP and BG readings are as follows ? ?Fasting: 12/04/21 93 ? ?DATE:             BP               PULSE ?12/04/21 150/77  61 ?12/05/21 139/75  76 ?12/06/21 134/75  71 ? ?Care Gaps: ?Last annual wellness visit? 03/29/21 ?If applicable: N/A ?Last eye exam / retinopathy screening? ?Diabetic foot exam? ?  ?[name], CMA ?Clinical Pharmacist Assistant  ?[phone]  ?

## 2021-12-06 NOTE — Telephone Encounter (Signed)
Patient was increased to 5 mg on 2/9. Home BP readings this week are as followed:  ? ?Monday: 150/77 ?Tuesday: 139/75 ?Today: 134/75  ? ?If continuing this dose she is requesting 90 day supply.  ? ?Valerie Kelley 12/06/21 9:00 AM ? ?

## 2021-12-07 ENCOUNTER — Ambulatory Visit (INDEPENDENT_AMBULATORY_CARE_PROVIDER_SITE_OTHER): Payer: Medicare Other | Admitting: Family Medicine

## 2021-12-07 ENCOUNTER — Encounter: Payer: Self-pay | Admitting: Family Medicine

## 2021-12-07 VITALS — BP 148/70 | HR 78 | Temp 97.0°F | Resp 18 | Ht 59.0 in | Wt 91.0 lb

## 2021-12-07 DIAGNOSIS — I129 Hypertensive chronic kidney disease with stage 1 through stage 4 chronic kidney disease, or unspecified chronic kidney disease: Secondary | ICD-10-CM | POA: Diagnosis not present

## 2021-12-07 DIAGNOSIS — J4540 Moderate persistent asthma, uncomplicated: Secondary | ICD-10-CM

## 2021-12-07 DIAGNOSIS — G4489 Other headache syndrome: Secondary | ICD-10-CM | POA: Diagnosis not present

## 2021-12-07 DIAGNOSIS — M898X9 Other specified disorders of bone, unspecified site: Secondary | ICD-10-CM

## 2021-12-07 DIAGNOSIS — N184 Chronic kidney disease, stage 4 (severe): Secondary | ICD-10-CM | POA: Diagnosis not present

## 2021-12-07 DIAGNOSIS — R5383 Other fatigue: Secondary | ICD-10-CM

## 2021-12-07 DIAGNOSIS — R202 Paresthesia of skin: Secondary | ICD-10-CM | POA: Insufficient documentation

## 2021-12-07 DIAGNOSIS — M810 Age-related osteoporosis without current pathological fracture: Secondary | ICD-10-CM | POA: Diagnosis not present

## 2021-12-07 DIAGNOSIS — R531 Weakness: Secondary | ICD-10-CM

## 2021-12-07 DIAGNOSIS — R5381 Other malaise: Secondary | ICD-10-CM

## 2021-12-07 DIAGNOSIS — R42 Dizziness and giddiness: Secondary | ICD-10-CM

## 2021-12-07 HISTORY — DX: Other headache syndrome: G44.89

## 2021-12-07 HISTORY — DX: Paresthesia of skin: R20.2

## 2021-12-07 HISTORY — DX: Other fatigue: R53.83

## 2021-12-07 LAB — SEDIMENTATION RATE: Sed Rate: 8 mm/hr (ref 0–40)

## 2021-12-07 NOTE — Assessment & Plan Note (Signed)
Checking labs. ?Hold thyroid medicine. ?

## 2021-12-07 NOTE — Patient Instructions (Addendum)
Weakness/fatigue:  ?Checking labs. ?Hold thyroid medicine. ? ?Hypertension:  ?Continue amlodipine 5 mg daily for blood pressure.   ?Hold medicine if systolic blood pressure (upper number) is less than 100. ? ?Following lab work: ?We will consider neck x-ray, lumbar x-ray due to pain and imbalance. ?If labs are normal, I will order an MRI of your brain for the dizziness. ? ?Asthma/COPD: ?Use albuterol inhaler 2 puffs prior to exertion/walking ?

## 2021-12-07 NOTE — Assessment & Plan Note (Signed)
The current medical regimen is effective;  continue present plan and medications.  

## 2021-12-07 NOTE — Assessment & Plan Note (Signed)
Ordered lab work.

## 2021-12-07 NOTE — Assessment & Plan Note (Signed)
Use albuterol inhaler 2 puffs prior to exertion/walking ?

## 2021-12-07 NOTE — Progress Notes (Deleted)
duplicate

## 2021-12-07 NOTE — Assessment & Plan Note (Signed)
Labwork was ordered. ?

## 2021-12-07 NOTE — Assessment & Plan Note (Signed)
We will consider neck x-ray, lumbar x-ray due to pain and imbalance. ? ?

## 2021-12-07 NOTE — Assessment & Plan Note (Signed)
If labs are normal, I will order an MRI of your brain for the dizziness. ?

## 2021-12-07 NOTE — Telephone Encounter (Signed)
BP is elevated, PCP seeing patient today. Will defer to them ?

## 2021-12-07 NOTE — Assessment & Plan Note (Signed)
Continue amlodipine 5 mg daily for blood pressure.   ?Hold medicine if systolic blood pressure (upper number) is less than 100. ?

## 2021-12-07 NOTE — Progress Notes (Signed)
? ?Acute Office Visit ? ?Subjective:  ? ? Patient ID: Valerie Kelley, female    DOB: [birth year], 88 y.o.   MRN: [id number] ? ?Chief Complaint  ?Patient presents with  ? Weakness  ? ? ?HPI: ?Patient is in today for weakness, tired, dizziness, shaky, walking sideways, body feels like jello,headache, bones ache x 2 yrs, but worsened in the last 6 months. Denies falls, but came close. Patient fees light headed at times. Worse with leaning over.  ?Weight has not changed significantly. Drinking carnation breakfast shakes, but not helped. Appetite is good. Drinks Propel and Water.  ? ?Sleep: 5-7 hrs per night. No naps during the day.  ? ?Has DYSPNEA ON EXERTION with walking up and down 12 steps twice and also when going to the mailbox (300 steps.) ? ?Seen on Saturday (seen at Urgent Care) for vertigo. Given zofran and antivert.) patient did not try antivert because she was afraid it would make her drowsy and she might fall. Dizziness lasted through Wednesday.  ? ?Past Medical History:  ?Diagnosis Date  ? Arthritis   ? "all over" (04/15/2013)  ? Asthma   ? Emphysema   ? "mild" (04/15/2013)  ? Exertional shortness of breath   ? Gastric paresis   ? GERD (gastroesophageal reflux disease)   ? H/O hiatal hernia   ? High cholesterol   ? Hypothyroidism   ? Myalgia due to statin   ? Stage III chronic kidney disease ([place])   ? ? ?Past Surgical History:  ?Procedure Laterality Date  ? ABDOMINAL HYSTERECTOMY    ? CARPAL TUNNEL RELEASE Left 11/06/2021  ? CT release.  ? REVERSE SHOULDER ARTHROPLASTY Left 04/15/2013  ? REVERSE SHOULDER ARTHROPLASTY Left 04/15/2013  ? Procedure: REVERSE SHOULDER ARTHROPLASTY LEFT;  Surgeon: Augustin Schooling, MD;  Location: [facility];  Service: Orthopedics;  Laterality: Left;  ? SINUS EXPLORATION    ? TONSILLECTOMY    ? ? ?Family History  ?Problem Relation Age of Onset  ? Stroke Mother   ? Cancer Brother   ?     brain  ? Cancer Brother   ?     leukemia  ? Cancer Brother   ?     lung  ? ? ?Social History   ? ?Socioeconomic History  ? Marital status: Widowed  ?  Spouse name: Not on file  ? Number of children: 2  ? Years of education: Not on file  ? Highest education level: Not on file  ?Occupational History  ? Occupation: Retired  ?Tobacco Use  ? Smoking status: Never  ? Smokeless tobacco: Never  ?Vaping Use  ? Vaping Use: Never used  ?Substance and Sexual Activity  ? Alcohol use: No  ? Drug use: No  ? Sexual activity: Not Currently  ?Other Topics Concern  ? Not on file  ?Social History Narrative  ? Not on file  ? ?Social Determinants of Health  ? ?Financial Resource Strain: Unknown  ? Difficulty of Paying Living Expenses: Patient refused  ?Food Insecurity: No Food Insecurity  ? Worried About Charity fundraiser in the Last Year: Never true  ? Ran Out of Food in the Last Year: Never true  ?Transportation Needs: No Transportation Needs  ? Lack of Transportation (Medical): No  ? Lack of Transportation (Non-Medical): No  ?Physical Activity: Insufficiently Active  ? Days of Exercise per Week: 5 days  ? Minutes of Exercise per Session: 10 min  ?Stress: Not on file  ?Social Connections: Not on file  ?  Intimate Partner Violence: Not on file  ? ? ?Outpatient Medications Prior to Visit  ?Medication Sig Dispense Refill  ? meclizine (ANTIVERT) 25 MG tablet Take 25 mg by mouth 3 (three) times daily as needed for dizziness.    ? acetaminophen (TYLENOL) 650 MG CR tablet Take 1 tablet (650 mg total) by mouth 3 (three) times daily as needed. 270 tablet 0  ? albuterol (PROVENTIL HFA;VENTOLIN HFA) 108 (90 BASE) MCG/ACT inhaler Inhale 2 puffs into the lungs every 6 (six) hours as needed for wheezing. For wheezing    ? amLODipine (NORVASC) 5 MG tablet Take 1 tablet (5 mg total) by mouth daily. 90 tablet 1  ? b complex vitamins capsule Take 1 capsule by mouth daily.    ? Cholecalciferol (VITAMIN D3) 125 MCG (5000 UT) TBDP Take 5,000 Units by mouth daily.    ? cimetidine (TAGAMET) 300 MG tablet Take 300 mg by mouth daily.    ? co-enzyme  Q-10 30 MG capsule Take 30 mg by mouth daily.    ? DULoxetine (CYMBALTA) 20 MG capsule Take 1 capsule (20 mg total) by mouth 2 (two) times daily. 180 capsule 1  ? EnovaRX-Naproxen 10 % CREA Apply 1 application topically 4 (four) times daily as needed (joint pain). 120 g 3  ? fish oil-omega-3 fatty acids 1000 MG capsule Take 2 capsules by mouth daily. Patient using wild alaskan salmon product with 2400 mg    ? Flaxseed Oil OIL by Does not apply route daily.    ? fluticasone (FLONASE) 50 MCG/ACT nasal spray Place into both nostrils.    ? Glucosamine Sulfate 1000 MG CAPS Take 1 capsule by mouth daily.    ? levothyroxine (SYNTHROID) 25 MCG tablet TAKE 1 TABLET BY MOUTH EVERY DAY BEFORE BREAKFAST 90 tablet 1  ? loratadine (CLARITIN) 10 MG tablet Take 10 mg by mouth daily as needed.    ? Multiple Vitamins-Minerals (ALIVE WOMENS 50+) TABS Take 1 tablet by mouth daily at 12 noon.    ? ondansetron (ZOFRAN-ODT) 4 MG disintegrating tablet Take 4 mg by mouth every 6 (six) hours as needed.    ? polyethylene glycol (MIRALAX / GLYCOLAX) packet Take 51 g by mouth daily as needed. Takes 3 capfuls daily    ? senna (SENOKOT) 8.6 MG tablet Take 1 tablet by mouth daily.    ? SYMBICORT 80-4.5 MCG/ACT inhaler Inhale into the lungs.    ? traZODone (DESYREL) 50 MG tablet Take 3 tablets (150 mg total) by mouth at bedtime. 270 tablet 1  ? TURMERIC PO Take 1 capsule by mouth daily.    ? vitamin E 400 UNIT capsule Take 400 Units by mouth daily.    ? budesonide-formoterol (SYMBICORT) 160-4.5 MCG/ACT inhaler Inhale 1 puff into the lungs daily as needed.    ? ezetimibe (ZETIA) 10 MG tablet Take 1 tablet (10 mg total) by mouth daily. 90 tablet 1  ? ?No facility-administered medications prior to visit.  ? ? ?Allergies  ?Allergen Reactions  ? Codeine Nausea And Vomiting and Nausea Only  ? Darvocet [Propoxyphene N-Acetaminophen] Nausea And Vomiting  ? Glucosamine   ? Other Hives and Nausea And Vomiting  ? Prednisone Swelling and Other (See Comments)   ?  Face swells and constant headache ?And headache  ? Pregabalin Other (See Comments)  ?  Makes her feel drunk.  ? Shellfish Allergy Hives  ? Statins   ?  Muscle pain.  ?  ? Welchol [Colesevelam]   ?  Muscle pain  ?  Zetia [Ezetimibe]   ? Penicillins Rash  ? ? ?Review of Systems  ?Constitutional:  Positive for fatigue. Negative for chills and fever.  ?HENT:  Negative for congestion, ear pain and sore throat.   ?Respiratory:  Positive for shortness of breath. Negative for cough.   ?Cardiovascular:  Negative for chest pain.  ?Gastrointestinal:  Positive for constipation (Miralax or sennakot.) and nausea (has nausea pills and ginger tablets.). Negative for abdominal pain, diarrhea and vomiting.  ?Endocrine: Positive for polyphagia and polyuria. Negative for polydipsia.  ?Genitourinary:  Positive for frequency. Negative for decreased urine volume, difficulty urinating, dysuria and hematuria.  ?Neurological:  Positive for dizziness (vertigo on Saturday (seen at Urgent Care)), tremors, weakness (Come and go.) and headaches.  ?Hematological:  Negative for adenopathy.  ? ?   ?Objective:  ?  ?Physical Exam ?Vitals reviewed.  ?Constitutional:   ?   Appearance: Normal appearance.  ?HENT:  ?   Right Ear: Tympanic membrane normal.  ?   Left Ear: Tympanic membrane normal.  ?   Nose: Nose normal.  ?   Mouth/Throat:  ?   Pharynx: No oropharyngeal exudate or posterior oropharyngeal erythema.  ?Neck:  ?   Vascular: No carotid bruit.  ?Cardiovascular:  ?   Rate and Rhythm: Normal rate and regular rhythm.  ?   Pulses: Normal pulses.  ?   Heart sounds: Normal heart sounds.  ?Pulmonary:  ?   Effort: Pulmonary effort is normal.  ?   Breath sounds: Normal breath sounds.  ?Abdominal:  ?   General: Bowel sounds are normal.  ?   Palpations: Abdomen is soft.  ?   Tenderness: There is no abdominal tenderness.  ?Lymphadenopathy:  ?   Cervical: No cervical adenopathy.  ?Skin: ?   Findings: No lesion or rash.  ?Neurological:  ?   Mental Status:  She is alert and oriented to person, place, and time.  ?Psychiatric:     ?   Mood and Affect: Mood normal.     ?   Behavior: Behavior normal.  ? ? ?BP (!) 148/70   Pulse 78   Temp (!) 97 ?F (36.1 ?C)   Resp 18   Ht 4'

## 2021-12-08 DIAGNOSIS — R5383 Other fatigue: Secondary | ICD-10-CM | POA: Diagnosis not present

## 2021-12-08 DIAGNOSIS — R531 Weakness: Secondary | ICD-10-CM | POA: Diagnosis not present

## 2021-12-08 DIAGNOSIS — M810 Age-related osteoporosis without current pathological fracture: Secondary | ICD-10-CM | POA: Diagnosis not present

## 2021-12-08 DIAGNOSIS — R5381 Other malaise: Secondary | ICD-10-CM | POA: Diagnosis not present

## 2021-12-11 LAB — COMPREHENSIVE METABOLIC PANEL
ALT: 16 IU/L (ref 0–32)
AST: 26 IU/L (ref 0–40)
Albumin/Globulin Ratio: 2 (ref 1.2–2.2)
Albumin: 4.8 g/dL — ABNORMAL HIGH (ref 3.6–4.6)
Alkaline Phosphatase: 84 IU/L (ref 44–121)
BUN/Creatinine Ratio: 12 (ref 12–28)
BUN: 16 mg/dL (ref 8–27)
Bilirubin Total: 0.2 mg/dL (ref 0.0–1.2)
CO2: 20 mmol/L (ref 20–29)
Calcium: 9.9 mg/dL (ref 8.7–10.3)
Chloride: 102 mmol/L (ref 96–106)
Creatinine, Ser: 1.36 mg/dL — ABNORMAL HIGH (ref 0.57–1.00)
Globulin, Total: 2.4 g/dL (ref 1.5–4.5)
Glucose: 91 mg/dL (ref 70–99)
Potassium: 4.5 mmol/L (ref 3.5–5.2)
Sodium: 138 mmol/L (ref 134–144)
Total Protein: 7.2 g/dL (ref 6.0–8.5)
eGFR: 38 mL/min/{1.73_m2} — ABNORMAL LOW (ref >59)

## 2021-12-11 LAB — CBC WITH DIFFERENTIAL/PLATELET
Basophils Absolute: 0 10*3/uL (ref 0.0–0.2)
Basos: 0 %
EOS (ABSOLUTE): 0 10*3/uL (ref 0.0–0.4)
Eos: 1 %
Hematocrit: 41.3 % (ref 34.0–46.6)
Hemoglobin: 13.7 g/dL (ref 11.1–15.9)
Immature Grans (Abs): 0 10*3/uL (ref 0.0–0.1)
Immature Granulocytes: 0 %
Lymphocytes Absolute: 2.1 10*3/uL (ref 0.7–3.1)
Lymphs: 28 %
MCH: 30.8 pg (ref 26.6–33.0)
MCHC: 33.2 g/dL (ref 31.5–35.7)
MCV: 93 fL (ref 79–97)
Monocytes Absolute: 0.8 10*3/uL (ref 0.1–0.9)
Monocytes: 10 %
Neutrophils Absolute: 4.6 10*3/uL (ref 1.4–7.0)
Neutrophils: 61 %
Platelets: 306 10*3/uL (ref 150–450)
RBC: 4.45 x10E6/uL (ref 3.77–5.28)
RDW: 13.4 % (ref 11.7–15.4)
WBC: 7.6 10*3/uL (ref 3.4–10.8)

## 2021-12-11 LAB — VITAMIN D 25 HYDROXY (VIT D DEFICIENCY, FRACTURES): Vit D, 25-Hydroxy: 38 ng/mL (ref 30.0–100.0)

## 2021-12-11 LAB — METHYLMALONIC ACID, SERUM: Methylmalonic Acid: 302 nmol/L (ref 0–378)

## 2021-12-11 NOTE — Progress Notes (Signed)
Blood count normal.  ?Liver function normal.  ?Kidney function abnormal, but stable. ?MMA, Vitamin D, ESR normal.  ?B12 and Folate pending. ?

## 2021-12-22 ENCOUNTER — Telehealth: Payer: Self-pay

## 2021-12-22 ENCOUNTER — Other Ambulatory Visit: Payer: Self-pay | Admitting: Family Medicine

## 2021-12-22 DIAGNOSIS — G4489 Other headache syndrome: Secondary | ICD-10-CM

## 2021-12-22 DIAGNOSIS — R42 Dizziness and giddiness: Secondary | ICD-10-CM

## 2021-12-22 NOTE — Progress Notes (Signed)
Mri b

## 2021-12-22 NOTE — Telephone Encounter (Signed)
Patient called and stated she was wanting you to schedule her a CT or MRI on the head, she spoke with you at her last appointment about having dizziness, and you mention she may need one of these test. Please advise.  ?

## 2021-12-25 ENCOUNTER — Telehealth: Payer: Self-pay | Admitting: Family Medicine

## 2021-12-25 NOTE — Telephone Encounter (Signed)
? ?  Valerie Kelley has been scheduled for the following appointment: ? ?WHAT: BRAIN MRI  ?WHERE: [facility] ?DATE: 12/27/21 ?TIME: 11:45 AM ARRIVAL TIME ? ?Patient has been made aware. ? ?

## 2021-12-27 DIAGNOSIS — R519 Headache, unspecified: Secondary | ICD-10-CM | POA: Diagnosis not present

## 2021-12-27 DIAGNOSIS — G4489 Other headache syndrome: Secondary | ICD-10-CM | POA: Diagnosis not present

## 2021-12-27 DIAGNOSIS — M4712 Other spondylosis with myelopathy, cervical region: Secondary | ICD-10-CM | POA: Diagnosis not present

## 2021-12-27 DIAGNOSIS — R42 Dizziness and giddiness: Secondary | ICD-10-CM | POA: Diagnosis not present

## 2021-12-29 ENCOUNTER — Other Ambulatory Visit: Payer: Self-pay

## 2021-12-29 ENCOUNTER — Other Ambulatory Visit: Payer: Self-pay | Admitting: Family Medicine

## 2021-12-29 DIAGNOSIS — R29898 Other symptoms and signs involving the musculoskeletal system: Secondary | ICD-10-CM

## 2021-12-29 DIAGNOSIS — R2689 Other abnormalities of gait and mobility: Secondary | ICD-10-CM

## 2021-12-29 DIAGNOSIS — Z20822 Contact with and (suspected) exposure to covid-19: Secondary | ICD-10-CM | POA: Diagnosis not present

## 2021-12-29 DIAGNOSIS — M4802 Spinal stenosis, cervical region: Secondary | ICD-10-CM

## 2021-12-29 DIAGNOSIS — R42 Dizziness and giddiness: Secondary | ICD-10-CM

## 2021-12-29 DIAGNOSIS — G4489 Other headache syndrome: Secondary | ICD-10-CM

## 2022-01-03 DIAGNOSIS — R051 Acute cough: Secondary | ICD-10-CM | POA: Diagnosis not present

## 2022-01-03 DIAGNOSIS — R059 Cough, unspecified: Secondary | ICD-10-CM | POA: Diagnosis not present

## 2022-01-03 DIAGNOSIS — Z20822 Contact with and (suspected) exposure to covid-19: Secondary | ICD-10-CM | POA: Diagnosis not present

## 2022-01-22 ENCOUNTER — Ambulatory Visit (INDEPENDENT_AMBULATORY_CARE_PROVIDER_SITE_OTHER): Payer: Medicare Other | Admitting: Family Medicine

## 2022-01-22 ENCOUNTER — Encounter: Payer: Self-pay | Admitting: Family Medicine

## 2022-01-22 VITALS — BP 130/70 | HR 63 | Temp 98.6°F | Resp 16 | Ht 59.0 in | Wt 92.0 lb

## 2022-01-22 DIAGNOSIS — R531 Weakness: Secondary | ICD-10-CM | POA: Diagnosis not present

## 2022-01-22 DIAGNOSIS — M898X9 Other specified disorders of bone, unspecified site: Secondary | ICD-10-CM

## 2022-01-22 DIAGNOSIS — E039 Hypothyroidism, unspecified: Secondary | ICD-10-CM

## 2022-01-22 DIAGNOSIS — G4489 Other headache syndrome: Secondary | ICD-10-CM

## 2022-01-22 NOTE — Progress Notes (Signed)
? ?Subjective:  ?Patient ID: Valerie Kelley, female    DOB: Jun 30, 1934  Age: 88 y.o. MRN: [id number] ? ?Chief Complaint  ?Patient presents with  ? Hypothyroidism  ? ? ?HPI ?Patient is here to check TSH because she is not taking levothyroxine. She says she forgot to stop her Biotin for the last five days and this can throw her thyroid levels off. Patient wanted to stop levothyroxine, because she was convinced she might feel better off of it. She may feel a little better she says.  ? ?Patient is in today for follow up of weakness, tired, dizziness, shaky, walking sideways, headache, bones ache x 2 yrs, but worsened in the last 7 months. She is scheduled to see Neurosurgery May 3rd. MRI Brain (12/27/2021) showed severe narrowing at craniocervical junction showing cord impingement.  ? ?She has had one fall since her last visit. Patient did not go anywhere after the fall. She was in her yard and fell forward.  She just had some bruises. No significant injuries. ? ?I discontinued amlodipine and patient checked bp daily. Usually SBP in 120-130s/60s.  ? ?Patient had right carotid stenosis 1-49% in 2020. Intolerant to statins, zetia, welchol. She needs carotid ultrasound to recheck, but again patient wants to wait for all testing until sees neurosurgery.  ? ?Lipids LDL very high in 08/2021. Patient refused repatha. She is not willing to come back for recheck of lipids due to her imbalance and difficulty traveling. She has to get a ride places.  ? ?Current Outpatient Medications on File Prior to Visit  ?Medication Sig Dispense Refill  ? acetaminophen (TYLENOL) 650 MG CR tablet Take 1 tablet (650 mg total) by mouth 3 (three) times daily as needed. 270 tablet 0  ? albuterol (PROVENTIL HFA;VENTOLIN HFA) 108 (90 BASE) MCG/ACT inhaler Inhale 2 puffs into the lungs every 6 (six) hours as needed for wheezing. For wheezing    ? b complex vitamins capsule Take 1 capsule by mouth daily.    ? Cholecalciferol (VITAMIN D3) 125 MCG (5000 UT)  TBDP Take 5,000 Units by mouth daily.    ? cimetidine (TAGAMET) 300 MG tablet Take 300 mg by mouth daily.    ? co-enzyme Q-10 30 MG capsule Take 30 mg by mouth daily.    ? DULoxetine (CYMBALTA) 20 MG capsule Take 1 capsule (20 mg total) by mouth 2 (two) times daily. 180 capsule 1  ? EnovaRX-Naproxen 10 % CREA Apply 1 application topically 4 (four) times daily as needed (joint pain). 120 g 3  ? fish oil-omega-3 fatty acids 1000 MG capsule Take 2 capsules by mouth daily. Patient using wild alaskan salmon product with 2400 mg    ? Flaxseed Oil OIL by Does not apply route daily.    ? fluticasone (FLONASE) 50 MCG/ACT nasal spray Place into both nostrils.    ? Glucosamine Sulfate 1000 MG CAPS Take 1 capsule by mouth daily.    ? loratadine (CLARITIN) 10 MG tablet Take 10 mg by mouth daily as needed.    ? Multiple Vitamins-Minerals (ALIVE WOMENS 50+) TABS Take 1 tablet by mouth daily at 12 noon.    ? ondansetron (ZOFRAN-ODT) 4 MG disintegrating tablet Take 4 mg by mouth every 6 (six) hours as needed.    ? polyethylene glycol (MIRALAX / GLYCOLAX) packet Take 51 g by mouth daily as needed. Takes 3 capfuls daily    ? senna (SENOKOT) 8.6 MG tablet Take 1 tablet by mouth daily.    ? SYMBICORT 80-4.5 MCG/ACT  inhaler Inhale into the lungs.    ? traZODone (DESYREL) 50 MG tablet Take 3 tablets (150 mg total) by mouth at bedtime. 270 tablet 1  ? TURMERIC PO Take 1 capsule by mouth daily.    ? vitamin E 400 UNIT capsule Take 400 Units by mouth daily.    ? ?No current facility-administered medications on file prior to visit.  ? ?Past Medical History:  ?Diagnosis Date  ? Arthritis   ? "all over" (04/15/2013)  ? Asthma   ? Emphysema   ? "mild" (04/15/2013)  ? Exertional shortness of breath   ? Gastric paresis   ? GERD (gastroesophageal reflux disease)   ? H/O hiatal hernia   ? High cholesterol   ? Hypothyroidism   ? Myalgia due to statin   ? Stage III chronic kidney disease ([place])   ? ?Past Surgical History:  ?Procedure Laterality Date  ?  ABDOMINAL HYSTERECTOMY    ? CARPAL TUNNEL RELEASE Left 11/06/2021  ? CT release.  ? REVERSE SHOULDER ARTHROPLASTY Left 04/15/2013  ? REVERSE SHOULDER ARTHROPLASTY Left 04/15/2013  ? Procedure: REVERSE SHOULDER ARTHROPLASTY LEFT;  Surgeon: Augustin Schooling, MD;  Location: [facility];  Service: Orthopedics;  Laterality: Left;  ? SINUS EXPLORATION    ? TONSILLECTOMY    ?  ?Family History  ?Problem Relation Age of Onset  ? Stroke Mother   ? Cancer Brother   ?     brain  ? Cancer Brother   ?     leukemia  ? Cancer Brother   ?     lung  ? ?Social History  ? ?Socioeconomic History  ? Marital status: Widowed  ?  Spouse name: Not on file  ? Number of children: 2  ? Years of education: Not on file  ? Highest education level: Not on file  ?Occupational History  ? Occupation: Retired  ?Tobacco Use  ? Smoking status: Never  ? Smokeless tobacco: Never  ?Vaping Use  ? Vaping Use: Never used  ?Substance and Sexual Activity  ? Alcohol use: No  ? Drug use: No  ? Sexual activity: Not Currently  ?Other Topics Concern  ? Not on file  ?Social History Narrative  ? Not on file  ? ?Social Determinants of Health  ? ?Financial Resource Strain: Unknown  ? Difficulty of Paying Living Expenses: Patient refused  ?Food Insecurity: Not on file  ?Transportation Needs: Not on file  ?Physical Activity: Not on file  ?Stress: Not on file  ?Social Connections: Not on file  ? ? ?Review of Systems  ?Constitutional:  Negative for chills, fatigue and fever.  ?HENT:  Negative for congestion, ear pain and sore throat.   ?Respiratory:  Positive for shortness of breath. Negative for cough.   ?Cardiovascular:  Negative for chest pain and palpitations.  ?Gastrointestinal:  Negative for abdominal pain, constipation, diarrhea, nausea and vomiting.  ?Endocrine: Negative for polydipsia, polyphagia and polyuria.  ?Genitourinary:  Negative for difficulty urinating and dysuria.  ?Musculoskeletal:  Negative for arthralgias, back pain and myalgias.  ?Skin:  Negative for rash.   ?Neurological:  Negative for headaches.  ?Psychiatric/Behavioral:  Negative for dysphoric mood. The patient is not nervous/anxious.   ? ? ?Objective:  ?BP 130/70   Pulse 63   Temp 98.6 ?F (37 ?C)   Resp 16   Ht '4\' 11"'$  (1.499 m)   Wt 92 lb (41.7 kg)   SpO2 98%   BMI 18.58 kg/m?  ? ? ?  01/22/2022  ?  3:17  PM 12/07/2021  ?  9:01 AM 11/02/2021  ?  8:24 AM  ?BP/Weight  ?Systolic BP 102 585 277  ?Diastolic BP 70 70 62  ?Wt. (Lbs) 92 91 94  ?BMI 18.58 kg/m2 18.38 kg/m2 19.65 kg/m2  ? ? ?Physical Exam ?Vitals reviewed.  ?Constitutional:   ?   Appearance: Normal appearance.  ?   Comments: Thin  ?Neck:  ?   Vascular: No carotid bruit.  ?Cardiovascular:  ?   Rate and Rhythm: Normal rate and regular rhythm.  ?   Heart sounds: Normal heart sounds.  ?Pulmonary:  ?   Effort: Pulmonary effort is normal. No respiratory distress.  ?   Breath sounds: Normal breath sounds.  ?Abdominal:  ?   General: Abdomen is flat. Bowel sounds are normal.  ?   Palpations: Abdomen is soft.  ?   Tenderness: There is no abdominal tenderness.  ?Neurological:  ?   Mental Status: She is alert and oriented to person, place, and time.  ?Psychiatric:     ?   Mood and Affect: Mood normal.     ?   Behavior: Behavior normal.  ? ? ?Diabetic Foot Exam - Simple   ?No data filed ?  ?  ? ?Lab Results  ?Component Value Date  ? WBC 7.6 12/08/2021  ? HGB 13.7 12/08/2021  ? HCT 41.3 12/08/2021  ? PLT 306 12/08/2021  ? GLUCOSE 91 12/08/2021  ? CHOL 300 (H) 08/24/2021  ? TRIG 191 (H) 08/24/2021  ? HDL 68 08/24/2021  ? LDLCALC 196 (H) 08/24/2021  ? ALT 16 12/08/2021  ? AST 26 12/08/2021  ? NA 138 12/08/2021  ? K 4.5 12/08/2021  ? CL 102 12/08/2021  ? CREATININE 1.36 (H) 12/08/2021  ? BUN 16 12/08/2021  ? CO2 20 12/08/2021  ? TSH 6.120 (H) 01/22/2022  ? ? ? ? ?Assessment & Plan:  ? ?Problem List Items Addressed This Visit   ? ?  ? Endocrine  ? Acquired hypothyroidism - Primary  ?  Check tsh, free T4. ? ?  ?  ? Relevant Orders  ? T4, free (Completed)  ? TSH (Completed)  ?   ? Other  ? Weakness  ?  Keep appt with neurosurgery.  ? ?  ?  ? Bone pain  ?  No etiology found.  ?Appt with neurosurgery  ? ?  ?  ? Other headache syndrome  ?  Appt with neurosurgery  ? ?  ?  ?. ? ?Orders P

## 2022-01-23 LAB — TSH: TSH: 6.12 u[IU]/mL — ABNORMAL HIGH (ref 0.450–4.500)

## 2022-01-23 LAB — T4, FREE: Free T4: 1.02 ng/dL (ref 0.82–1.77)

## 2022-01-24 DIAGNOSIS — G959 Disease of spinal cord, unspecified: Secondary | ICD-10-CM | POA: Diagnosis not present

## 2022-01-29 DIAGNOSIS — M542 Cervicalgia: Secondary | ICD-10-CM | POA: Diagnosis not present

## 2022-01-29 DIAGNOSIS — Z20822 Contact with and (suspected) exposure to covid-19: Secondary | ICD-10-CM | POA: Diagnosis not present

## 2022-02-01 DIAGNOSIS — G9529 Other cord compression: Secondary | ICD-10-CM | POA: Diagnosis not present

## 2022-02-01 DIAGNOSIS — M542 Cervicalgia: Secondary | ICD-10-CM | POA: Diagnosis not present

## 2022-02-01 DIAGNOSIS — R200 Anesthesia of skin: Secondary | ICD-10-CM | POA: Diagnosis not present

## 2022-02-01 DIAGNOSIS — M4802 Spinal stenosis, cervical region: Secondary | ICD-10-CM | POA: Diagnosis not present

## 2022-02-04 NOTE — Assessment & Plan Note (Signed)
Keep appt with neurosurgery.  ?

## 2022-02-04 NOTE — Assessment & Plan Note (Signed)
Check tsh, free T4 

## 2022-02-04 NOTE — Assessment & Plan Note (Signed)
Appt with neurosurgery  ?

## 2022-02-04 NOTE — Assessment & Plan Note (Signed)
No etiology found.  ?Appt with neurosurgery  ?

## 2022-02-05 ENCOUNTER — Telehealth: Payer: Self-pay

## 2022-02-05 NOTE — Progress Notes (Signed)
? ? ?Chronic Care Management ?Pharmacy Assistant  ? ?Name: Valerie Kelley  MRN: [id number] DOB: 1933/12/18 ? ? ?Reason for Encounter: General Adherence Call  ?  ?Recent office visits:  ?01/22/22 [name] MD. Seen for Hypothyroidism. D/C Amlodipine Besylate '5mg'$ , Levothyroxine Sodium 26mg and Meclizine HCI '25mg'$ .  ? ?12/07/21 CRochel BromeMD. Seen for weakness. Hold thyroid medicine for labs. Changed Symbicort from 160-4.517m to 80-4.75m57m D/C Ezetimibe '10mg'$ .  ? ?Recent consult visits:  ?None ? ?Hospital visits:  ?None ? ?Medications: ?Outpatient Encounter Medications as of 02/05/2022  ?Medication Sig  ? acetaminophen (TYLENOL) 650 MG CR tablet Take 1 tablet (650 mg total) by mouth 3 (three) times daily as needed.  ? albuterol (PROVENTIL HFA;VENTOLIN HFA) 108 (90 BASE) MCG/ACT inhaler Inhale 2 puffs into the lungs every 6 (six) hours as needed for wheezing. For wheezing  ? b complex vitamins capsule Take 1 capsule by mouth daily.  ? Cholecalciferol (VITAMIN D3) 125 MCG (5000 UT) TBDP Take 5,000 Units by mouth daily.  ? cimetidine (TAGAMET) 300 MG tablet Take 300 mg by mouth daily.  ? co-enzyme Q-10 30 MG capsule Take 30 mg by mouth daily.  ? DULoxetine (CYMBALTA) 20 MG capsule Take 1 capsule (20 mg total) by mouth 2 (two) times daily.  ? EnovaRX-Naproxen 10 % CREA Apply 1 application topically 4 (four) times daily as needed (joint pain).  ? fish oil-omega-3 fatty acids 1000 MG capsule Take 2 capsules by mouth daily. Patient using wild alaskan salmon product with 2400 mg  ? Flaxseed Oil OIL by Does not apply route daily.  ? fluticasone (FLONASE) 50 MCG/ACT nasal spray Place into both nostrils.  ? Glucosamine Sulfate 1000 MG CAPS Take 1 capsule by mouth daily.  ? loratadine (CLARITIN) 10 MG tablet Take 10 mg by mouth daily as needed.  ? Multiple Vitamins-Minerals (ALIVE WOMENS 50+) TABS Take 1 tablet by mouth daily at 12 noon.  ? ondansetron (ZOFRAN-ODT) 4 MG disintegrating tablet Take 4 mg by mouth every 6 (six) hours  as needed.  ? polyethylene glycol (MIRALAX / GLYCOLAX) packet Take 51 g by mouth daily as needed. Takes 3 capfuls daily  ? senna (SENOKOT) 8.6 MG tablet Take 1 tablet by mouth daily.  ? SYMBICORT 80-4.5 MCG/ACT inhaler Inhale into the lungs.  ? traZODone (DESYREL) 50 MG tablet Take 3 tablets (150 mg total) by mouth at bedtime.  ? TURMERIC PO Take 1 capsule by mouth daily.  ? vitamin E 400 UNIT capsule Take 400 Units by mouth daily.  ? ?No facility-administered encounter medications on file as of 02/05/2022.  ? ? ?Contacted [facility] Review Call ? ? ?Chart Review: ? ?Have there been any documented new, changed, or discontinued medications since last visit? Yes  ?Has there been any documented recent hospitalizations or ED visits since last visit with Clinical Pharmacist? No ?Brief Summary (including medication and/or Diagnosis changes):None ? ? ?Adherence Review: ? ?Does the Clinical Pharmacist Assistant have access to adherence rates? Yes ?Adherence rates for STAR metric medications (List medication(s)/day supply/ last 2 fill dates).None ?Does the patient have >5 day gap between last estimated fill dates for any of the above medications or other medication gaps? No ?Reason for medication gaps.None ? ? ?Disease State Questions: ? ?Able to connect with Patient? Yes ?Did patient have any problems with their health recently? Yes ?Note problems and Concerns:Pt is having issues with her Spine and stated the Doctors had told her she is slowly being paralyzed. She gets  numbness in her hands when she puts her head down. She is going to see a Neurologist about this and feels bad.  ?Have you had any admissions or emergency room visits or worsening of your condition(s) since last visit? No ?Details of ED visit, hospital visit and/or worsening condition(s): ?Have you had any visits with new specialists or providers since your last visit? Yes ?Explain:Pt did see [facility] and [facility].  ?Have  you had any new health care problem(s) since your last visit? No ?New problem(s) reported: ?Have you run out of any of your medications since you last spoke with clinical pharmacist? No ?What caused you to run out of your medications? ?Are there any medications you are not taking as prescribed? No ?What kept you from taking your medications as prescribed? ?Are you having any issues or side effects with your medications? No ?Note of issues or side effects: ?Do you have any other health concerns or questions you want to discuss with your Clinical Pharmacist before your next visit? No ?Note additional concerns and questions from Patient. ?Are there any health concerns that you feel we can do a better job addressing? No ?Note Patient's response. ?Are you having any problems with any of the following since the last visit: (select all that apply) ? Ambulating/Walking ? Details:Pt is weak all over due to the neck and spine issues ?12. Any falls since last visit? Yes ? Details:02/02/22. She fell and hit her head while being in her flower bed. Pt declined to go to the ER. She denied any headaches or blurry vision. She stated she is just sore.  ?13. Any increased or uncontrolled pain since last visit? No ? Details: ?14. Next visit Type: telephone ?      Visit with: Dr. Tobie Poet ?       Date:02/22/22 ?       Time:8:00am ? ?15. Additional Details? Yes, pt declined a f/u with CPP at this time. She has a lot going on and does not want anymore appts right now.  ? ? ?[name], CMA ?Clinical Pharmacist Assistant  ?[phone] ?

## 2022-02-12 DIAGNOSIS — M4711 Other spondylosis with myelopathy, occipito-atlanto-axial region: Secondary | ICD-10-CM | POA: Diagnosis not present

## 2022-02-13 ENCOUNTER — Other Ambulatory Visit: Payer: Self-pay

## 2022-02-13 MED ORDER — CIMETIDINE 300 MG PO TABS: 300.0000 mg | ORAL_TABLET | Freq: Every day | ORAL | 0 refills | Status: DC

## 2022-02-15 ENCOUNTER — Telehealth: Payer: Self-pay

## 2022-02-15 NOTE — Telephone Encounter (Signed)
Valerie Kelley was notified that the tagamet is non-formulary.  She has called her GI physician and they are checking on Prilosec for her.  She was encouraged to call us back if further assistance is needed.

## 2022-02-22 ENCOUNTER — Ambulatory Visit: Payer: Medicare Other | Admitting: Family Medicine

## 2022-03-07 DIAGNOSIS — J449 Chronic obstructive pulmonary disease, unspecified: Secondary | ICD-10-CM | POA: Diagnosis not present

## 2022-03-07 DIAGNOSIS — E119 Type 2 diabetes mellitus without complications: Secondary | ICD-10-CM | POA: Diagnosis not present

## 2022-03-07 DIAGNOSIS — R1030 Lower abdominal pain, unspecified: Secondary | ICD-10-CM | POA: Diagnosis not present

## 2022-03-07 DIAGNOSIS — I700 Atherosclerosis of aorta: Secondary | ICD-10-CM | POA: Diagnosis not present

## 2022-03-07 DIAGNOSIS — J439 Emphysema, unspecified: Secondary | ICD-10-CM | POA: Diagnosis not present

## 2022-03-07 DIAGNOSIS — K5900 Constipation, unspecified: Secondary | ICD-10-CM | POA: Diagnosis not present

## 2022-03-12 ENCOUNTER — Telehealth: Payer: Self-pay

## 2022-03-12 ENCOUNTER — Other Ambulatory Visit: Payer: Self-pay | Admitting: Family Medicine

## 2022-03-12 DIAGNOSIS — R9431 Abnormal electrocardiogram [ECG] [EKG]: Secondary | ICD-10-CM | POA: Diagnosis not present

## 2022-03-12 DIAGNOSIS — E039 Hypothyroidism, unspecified: Secondary | ICD-10-CM | POA: Diagnosis not present

## 2022-03-12 DIAGNOSIS — I6529 Occlusion and stenosis of unspecified carotid artery: Secondary | ICD-10-CM | POA: Diagnosis not present

## 2022-03-12 DIAGNOSIS — K3184 Gastroparesis: Secondary | ICD-10-CM | POA: Diagnosis not present

## 2022-03-12 DIAGNOSIS — I10 Essential (primary) hypertension: Secondary | ICD-10-CM | POA: Diagnosis not present

## 2022-03-12 DIAGNOSIS — N1831 Chronic kidney disease, stage 3a: Secondary | ICD-10-CM | POA: Diagnosis not present

## 2022-03-12 DIAGNOSIS — Z01812 Encounter for preprocedural laboratory examination: Secondary | ICD-10-CM | POA: Diagnosis not present

## 2022-03-12 DIAGNOSIS — Z01818 Encounter for other preprocedural examination: Secondary | ICD-10-CM | POA: Diagnosis not present

## 2022-03-12 DIAGNOSIS — J45909 Unspecified asthma, uncomplicated: Secondary | ICD-10-CM | POA: Diagnosis not present

## 2022-03-12 DIAGNOSIS — M549 Dorsalgia, unspecified: Secondary | ICD-10-CM | POA: Diagnosis not present

## 2022-03-12 DIAGNOSIS — I129 Hypertensive chronic kidney disease with stage 1 through stage 4 chronic kidney disease, or unspecified chronic kidney disease: Secondary | ICD-10-CM | POA: Diagnosis not present

## 2022-03-12 MED ORDER — AMLODIPINE BESYLATE 5 MG PO TABS: 5.0000 mg | ORAL_TABLET | Freq: Every day | ORAL | 2 refills | Status: DC

## 2022-03-12 NOTE — Telephone Encounter (Signed)
Patient informed. Valerie Kelley 

## 2022-03-12 NOTE — Telephone Encounter (Signed)
Patient had preop today and Nurse Practicioner called because patient's blood pressure was 170/64. Patient asked if she needs to restart amlodipine and what dose? She only has 11 pills left. Her surgery for lumbar spine will be on June 29th. Please advice

## 2022-03-22 DIAGNOSIS — M532X2 Spinal instabilities, cervical region: Secondary | ICD-10-CM | POA: Diagnosis not present

## 2022-03-22 DIAGNOSIS — K449 Diaphragmatic hernia without obstruction or gangrene: Secondary | ICD-10-CM | POA: Diagnosis not present

## 2022-03-22 DIAGNOSIS — Z885 Allergy status to narcotic agent status: Secondary | ICD-10-CM | POA: Diagnosis not present

## 2022-03-22 DIAGNOSIS — J45909 Unspecified asthma, uncomplicated: Secondary | ICD-10-CM | POA: Diagnosis not present

## 2022-03-22 DIAGNOSIS — M1990 Unspecified osteoarthritis, unspecified site: Secondary | ICD-10-CM | POA: Diagnosis not present

## 2022-03-22 DIAGNOSIS — R339 Retention of urine, unspecified: Secondary | ICD-10-CM | POA: Diagnosis not present

## 2022-03-22 DIAGNOSIS — N183 Chronic kidney disease, stage 3 unspecified: Secondary | ICD-10-CM | POA: Diagnosis not present

## 2022-03-22 DIAGNOSIS — I340 Nonrheumatic mitral (valve) insufficiency: Secondary | ICD-10-CM | POA: Diagnosis not present

## 2022-03-22 DIAGNOSIS — Z888 Allergy status to other drugs, medicaments and biological substances status: Secondary | ICD-10-CM | POA: Diagnosis not present

## 2022-03-22 DIAGNOSIS — M4802 Spinal stenosis, cervical region: Secondary | ICD-10-CM | POA: Diagnosis not present

## 2022-03-22 DIAGNOSIS — I358 Other nonrheumatic aortic valve disorders: Secondary | ICD-10-CM | POA: Diagnosis not present

## 2022-03-22 DIAGNOSIS — G992 Myelopathy in diseases classified elsewhere: Secondary | ICD-10-CM | POA: Diagnosis not present

## 2022-03-22 DIAGNOSIS — Z880 Allergy status to penicillin: Secondary | ICD-10-CM | POA: Diagnosis not present

## 2022-03-22 DIAGNOSIS — K3184 Gastroparesis: Secondary | ICD-10-CM | POA: Diagnosis not present

## 2022-03-22 DIAGNOSIS — G959 Disease of spinal cord, unspecified: Secondary | ICD-10-CM | POA: Diagnosis not present

## 2022-03-22 DIAGNOSIS — K219 Gastro-esophageal reflux disease without esophagitis: Secondary | ICD-10-CM | POA: Diagnosis not present

## 2022-03-22 DIAGNOSIS — Z79899 Other long term (current) drug therapy: Secondary | ICD-10-CM | POA: Diagnosis not present

## 2022-03-22 DIAGNOSIS — I361 Nonrheumatic tricuspid (valve) insufficiency: Secondary | ICD-10-CM | POA: Diagnosis not present

## 2022-03-22 DIAGNOSIS — R2689 Other abnormalities of gait and mobility: Secondary | ICD-10-CM | POA: Diagnosis not present

## 2022-04-02 ENCOUNTER — Other Ambulatory Visit: Payer: Self-pay | Admitting: Family Medicine

## 2022-04-04 ENCOUNTER — Telehealth: Payer: Self-pay

## 2022-04-04 NOTE — Progress Notes (Signed)
Chronic Care Management Pharmacy Assistant   Name: Valerie Kelley  MRN: [id number] DOB: 02-14-1934   Reason for Encounter: General Adherence Call    Recent office visits:  03/12/22 [name] MD. Start Amlodipine '5mg'$  qd.  Recent consult visits:  03/12/22 (Gen Surg) Rolm Bookbinder ANP. Seen for preop exam. No med changes.  02/14/22 (Digestive) Scherrie November MD. Orders only. Started on Omeprazole '40mg'$  daily.  Hospital visits:  03/22/22 [place] No notes available   Medications: Outpatient Encounter Medications as of 04/04/2022  Medication Sig   acetaminophen (TYLENOL) 650 MG CR tablet Take 1 tablet (650 mg total) by mouth 3 (three) times daily as needed.   albuterol (PROVENTIL HFA;VENTOLIN HFA) 108 (90 BASE) MCG/ACT inhaler Inhale 2 puffs into the lungs every 6 (six) hours as needed for wheezing. For wheezing   amLODipine (NORVASC) 5 MG tablet Take 1 tablet by mouth once daily   b complex vitamins capsule Take 1 capsule by mouth daily.   Cholecalciferol (VITAMIN D3) 125 MCG (5000 UT) TBDP Take 5,000 Units by mouth daily.   cimetidine (TAGAMET) 300 MG tablet Take 1 tablet (300 mg total) by mouth daily.   co-enzyme Q-10 30 MG capsule Take 30 mg by mouth daily.   DULoxetine (CYMBALTA) 20 MG capsule Take 1 capsule (20 mg total) by mouth 2 (two) times daily.   EnovaRX-Naproxen 10 % CREA Apply 1 application topically 4 (four) times daily as needed (joint pain).   fish oil-omega-3 fatty acids 1000 MG capsule Take 2 capsules by mouth daily. Patient using wild alaskan salmon product with 2400 mg   Flaxseed Oil OIL by Does not apply route daily.   fluticasone (FLONASE) 50 MCG/ACT nasal spray Place into both nostrils.   Glucosamine Sulfate 1000 MG CAPS Take 1 capsule by mouth daily.   loratadine (CLARITIN) 10 MG tablet Take 10 mg by mouth daily as needed.   Multiple Vitamins-Minerals (ALIVE WOMENS 50+) TABS Take 1 tablet by mouth daily at 12 noon.   ondansetron (ZOFRAN-ODT) 4 MG  disintegrating tablet Take 4 mg by mouth every 6 (six) hours as needed.   polyethylene glycol (MIRALAX / GLYCOLAX) packet Take 51 g by mouth daily as needed. Takes 3 capfuls daily   senna (SENOKOT) 8.6 MG tablet Take 1 tablet by mouth daily.   SYMBICORT 80-4.5 MCG/ACT inhaler Inhale into the lungs.   traZODone (DESYREL) 50 MG tablet Take 3 tablets (150 mg total) by mouth at bedtime.   TURMERIC PO Take 1 capsule by mouth daily.   vitamin E 400 UNIT capsule Take 400 Units by mouth daily.   No facility-administered encounter medications on file as of 04/04/2022.    Contacted [name] for General Review Call   Chart Review:  Have there been any documented new, changed, or discontinued medications since last visit? Yes  Has there been any documented recent hospitalizations or ED visits since last visit with Clinical Pharmacist? Yes Brief Summary (including medication and/or Diagnosis changes): No notes available    Adherence Review:  Does the Clinical Pharmacist Assistant have access to adherence rates? Yes Adherence rates for STAR metric medications (List medication(s)/day supply/ last 2 fill dates).None Adherence rates for medications indicated for disease state being reviewed (List medication(s)/day supply/ last 2 fill dates). Does the patient have >5 day gap between last estimated fill dates for any of the above medications or other medication gaps? No Reason for medication gaps.   Disease State Questions:  Able to connect with Patient?  No  Several attempt were made but unsuccessful call    [name], [place] Pharmacist Assistant  [phone]

## 2022-04-16 ENCOUNTER — Other Ambulatory Visit: Payer: Self-pay | Admitting: Family Medicine

## 2022-04-21 HISTORY — PX: CERVICAL FUSION: SHX112

## 2022-05-29 DIAGNOSIS — Z981 Arthrodesis status: Secondary | ICD-10-CM | POA: Diagnosis not present

## 2022-05-29 DIAGNOSIS — M47812 Spondylosis without myelopathy or radiculopathy, cervical region: Secondary | ICD-10-CM | POA: Diagnosis not present

## 2022-06-12 ENCOUNTER — Telehealth: Payer: Self-pay

## 2022-06-12 NOTE — Progress Notes (Signed)
Chronic Care Management Pharmacy Assistant   Name: Valerie Kelley  MRN: [id number] DOB: 1934/09/01   Reason for Encounter: General Adherence Call    Recent office visits:  None  Recent consult visits:  None  Hospital visits:  None  Medications: Outpatient Encounter Medications as of 06/12/2022  Medication Sig   acetaminophen (TYLENOL) 650 MG CR tablet Take 1 tablet (650 mg total) by mouth 3 (three) times daily as needed.   albuterol (PROVENTIL HFA;VENTOLIN HFA) 108 (90 BASE) MCG/ACT inhaler Inhale 2 puffs into the lungs every 6 (six) hours as needed for wheezing. For wheezing   amLODipine (NORVASC) 5 MG tablet Take 1 tablet by mouth once daily   b complex vitamins capsule Take 1 capsule by mouth daily.   Cholecalciferol (VITAMIN D3) 125 MCG (5000 UT) TBDP Take 5,000 Units by mouth daily.   cimetidine (TAGAMET) 300 MG tablet Take 1 tablet (300 mg total) by mouth daily.   co-enzyme Q-10 30 MG capsule Take 30 mg by mouth daily.   DULoxetine (CYMBALTA) 20 MG capsule TAKE 1 CAPSULE BY MOUTH TWICE  DAILY   EnovaRX-Naproxen 10 % CREA Apply 1 application topically 4 (four) times daily as needed (joint pain).   fish oil-omega-3 fatty acids 1000 MG capsule Take 2 capsules by mouth daily. Patient using wild alaskan salmon product with 2400 mg   Flaxseed Oil OIL by Does not apply route daily.   fluticasone (FLONASE) 50 MCG/ACT nasal spray Place into both nostrils.   Glucosamine Sulfate 1000 MG CAPS Take 1 capsule by mouth daily.   loratadine (CLARITIN) 10 MG tablet Take 10 mg by mouth daily as needed.   Multiple Vitamins-Minerals (ALIVE WOMENS 50+) TABS Take 1 tablet by mouth daily at 12 noon.   ondansetron (ZOFRAN-ODT) 4 MG disintegrating tablet Take 4 mg by mouth every 6 (six) hours as needed.   polyethylene glycol (MIRALAX / GLYCOLAX) packet Take 51 g by mouth daily as needed. Takes 3 capfuls daily   senna (SENOKOT) 8.6 MG tablet Take 1 tablet by mouth daily.   SYMBICORT 80-4.5  MCG/ACT inhaler Inhale into the lungs.   traZODone (DESYREL) 50 MG tablet TAKE 3 TABLETS BY MOUTH AT  BEDTIME   TURMERIC PO Take 1 capsule by mouth daily.   vitamin E 400 UNIT capsule Take 400 Units by mouth daily.   No facility-administered encounter medications on file as of 06/12/2022.    Contacted [name] for General Review Call   Chart Review:  Have there been any documented new, changed, or discontinued medications since last visit? Yes  Has there been any documented recent hospitalizations or ED visits since last visit with Clinical Pharmacist? Yes Brief Summary (including medication and/or Diagnosis changes):No notes available   Adherence Review:  Does the Clinical Pharmacist Assistant have access to adherence rates? Yes Adherence rates for STAR metric medications (List medication(s)/day supply/ last 2 fill dates). None  Adherence rates for medications indicated for disease state being reviewed (List medication(s)/day supply/ last 2 fill dates). Does the patient have >5 day gap between last estimated fill dates for any of the above medications or other medication gaps? No Reason for medication gaps.   Disease State Questions:  Able to connect with Patient? Yes Did patient have any problems with their health recently? No  Have you had any admissions or emergency room visits or worsening of your condition(s) since last visit? Yes, pt had surgery on her back   Have you had any visits with  new specialists or providers since your last visit? Yes Explain:Pt had surgery on her back through [facility] with Dr. Prince Rome Have you had any new health care problem(s) since your last visit? No  Have you run out of any of your medications since you last spoke with clinical pharmacist? No  Are there any medications you are not taking as prescribed? No  Are you having any issues or side effects with your medications? No  Do you have any other health concerns or  questions you want to discuss with your Clinical Pharmacist before your next visit? No  Are there any health concerns that you feel we can do a better job addressing? No  Are you having any problems with any of the following since the last visit: (select all that apply)  None  12. Any falls since last visit? No  Details: 13. Any increased or uncontrolled pain since last visit? No  14. Next visit Type: telephone       Visit with:Nathan Kennedy CPP        Date:10/18/22        Time:10:00am   15. Additional Details? Yes, pt had surgery on her back and she is healing from that. Her pain level is 6 out of 10 but cannot take any opioids to help with pain due to side effects. She is trying to keep up and walk as much as she can. She has a lot of help that will come and help her at home.    [name], [place] Pharmacist Assistant  [phone]

## 2022-06-13 DIAGNOSIS — Z23 Encounter for immunization: Secondary | ICD-10-CM | POA: Diagnosis not present

## 2022-07-04 DIAGNOSIS — Z23 Encounter for immunization: Secondary | ICD-10-CM | POA: Diagnosis not present

## 2022-07-16 ENCOUNTER — Other Ambulatory Visit: Payer: Self-pay

## 2022-07-16 MED ORDER — AMLODIPINE BESYLATE 5 MG PO TABS: 5.0000 mg | ORAL_TABLET | Freq: Every day | ORAL | 0 refills | Status: DC

## 2022-07-18 ENCOUNTER — Telehealth: Payer: Self-pay | Admitting: *Deleted

## 2022-07-18 NOTE — Chronic Care Management (AMB) (Signed)
  Care Coordination   Note   07/18/2022 Name: Valerie Kelley MRN: [id number] DOB: 01/26/1934  Valerie Kelley is a 88 y.o. year old female who sees Cox, Kirsten, MD for primary care. I reached out to Valerie Kelley by phone today to offer care coordination services.  Valerie Kelley was given information about Care Coordination services today including:   The Care Coordination services include support from the care team which includes your Nurse Coordinator, Clinical Social Worker, or Pharmacist.  The Care Coordination team is here to help remove barriers to the health concerns and goals most important to you. Care Coordination services are voluntary, and the patient may decline or stop services at any time by request to their care team member.   Care Coordination Consent Status: Patient agreed to services and verbal consent obtained.   Follow up plan:  Telephone appointment with care coordination team member scheduled for:  07/23/2022  Encounter Outcome:  Pt. Scheduled  Julian Hy, [place] Direct Dial: [phone]

## 2022-07-23 ENCOUNTER — Ambulatory Visit: Payer: Self-pay

## 2022-07-23 ENCOUNTER — Other Ambulatory Visit: Payer: Self-pay | Admitting: Family Medicine

## 2022-07-23 MED ORDER — AMLODIPINE BESYLATE 5 MG PO TABS
5.0000 mg | ORAL_TABLET | Freq: Every day | ORAL | 0 refills | Status: DC
Start: 2022-07-23 — End: 2022-09-17

## 2022-07-23 NOTE — Patient Outreach (Signed)
  Care Coordination   Initial Visit Note   07/23/2022 Name: Valerie Kelley MRN: [id number] DOB: 1933-11-14  Valerie Kelley is a 88 y.o. year old female who sees Cox, Kirsten, MD for primary care. I spoke with  Valerie Kelley by phone today.  What matters to the patients health and wellness today?  Placed call to patient today to complete assessment. Patient reports that she continues to struggle with her neck pain.  Reports she is wearing her stimulator 10 hours per day.   Patient reports she is almost out of her norvasc and needs RX sent to [place].  States that it is free at [facility].  Patient reports she wants to get her strength back.     Goals Addressed               This Visit's Progress     COMPLETED: I want to get my strength back. (pt-stated)        Care Coordination Interventions: Advised patient to call MD for any changes in condition Reviewed medications with patient and discussed patients concern about running out of Norvasc.  Secured chat sent to MD requesting RX be sent to [facility] for signs and symptoms of depression related to chronic disease state  Assessed social determinant of health barriers Offered to mail patient in home exercise plan and patient refused.  Offered to schedule follow up appointment and patient declined any further follow up.           SDOH assessments and interventions completed:  Yes  SDOH Interventions Today    Flowsheet Row Most Recent Value  SDOH Interventions   Food Insecurity Interventions Intervention Not Indicated  Housing Interventions Intervention Not Indicated  Transportation Interventions Intervention Not Indicated  Utilities Interventions Intervention Not Indicated  Alcohol Usage Interventions Intervention Not Indicated (Score <7)  Financial Strain Interventions Intervention Not Indicated  Physical Activity Interventions Intervention Not Indicated  Stress Interventions Intervention Not Indicated   Social Connections Interventions Intervention Not Indicated        Care Coordination Interventions Activated:  Yes  Care Coordination Interventions:  Yes, provided   Follow up plan: No further intervention required.   Encounter Outcome:  Pt. Visit Completed   Tomasa Rand, RN, BSN, CEN [place] Coordinator [phone]

## 2022-08-01 ENCOUNTER — Telehealth: Payer: Self-pay

## 2022-08-01 NOTE — Progress Notes (Signed)
Chronic Care Management Pharmacy Assistant   Name: PATRISIA FAETH  MRN: [id number] DOB: 07-Aug-1934   Reason for Encounter: General adherence call    Recent office visits:  None  Recent consult visits:  None  Hospital visits:  None  Medications: Outpatient Encounter Medications as of 08/01/2022  Medication Sig   acetaminophen (TYLENOL) 650 MG CR tablet Take 1 tablet (650 mg total) by mouth 3 (three) times daily as needed.   albuterol (PROVENTIL HFA;VENTOLIN HFA) 108 (90 BASE) MCG/ACT inhaler Inhale 2 puffs into the lungs every 6 (six) hours as needed for wheezing. For wheezing   amLODipine (NORVASC) 5 MG tablet Take 1 tablet (5 mg total) by mouth daily.   b complex vitamins capsule Take 1 capsule by mouth daily.   Cholecalciferol (VITAMIN D3) 125 MCG (5000 UT) TBDP Take 5,000 Units by mouth daily.   cimetidine (TAGAMET) 300 MG tablet Take 1 tablet (300 mg total) by mouth daily. (Patient not taking: Reported on 07/23/2022)   co-enzyme Q-10 30 MG capsule Take 30 mg by mouth daily.   DULoxetine (CYMBALTA) 20 MG capsule TAKE 1 CAPSULE BY MOUTH TWICE  DAILY   EnovaRX-Naproxen 10 % CREA Apply 1 application topically 4 (four) times daily as needed (joint pain).   fish oil-omega-3 fatty acids 1000 MG capsule Take 2 capsules by mouth every other day. Patient using wild alaskan salmon product with 2400 mg   Flaxseed Oil OIL by Does not apply route every other day.   fluticasone (FLONASE) 50 MCG/ACT nasal spray Place into both nostrils.   Glucosamine Sulfate 1000 MG CAPS Take 1 capsule by mouth daily. 500 mg tablets   loratadine (CLARITIN) 10 MG tablet Take 10 mg by mouth daily as needed.   Multiple Vitamins-Minerals (ALIVE WOMENS 50+) TABS Take 1 tablet by mouth daily at 12 noon.   omeprazole (PRILOSEC) 40 MG capsule Take 40 mg by mouth daily.   ondansetron (ZOFRAN-ODT) 4 MG disintegrating tablet Take 4 mg by mouth every 6 (six) hours as needed. (Patient not taking: Reported on  07/23/2022)   polyethylene glycol (MIRALAX / GLYCOLAX) packet Take 51 g by mouth daily. Takes 3 capfuls daily   senna (SENOKOT) 8.6 MG tablet Take 1 tablet by mouth daily. (Patient not taking: Reported on 07/23/2022)   SYMBICORT 80-4.5 MCG/ACT inhaler Inhale into the lungs.   traZODone (DESYREL) 50 MG tablet TAKE 3 TABLETS BY MOUTH AT  BEDTIME   TURMERIC PO Take 1 capsule by mouth daily. (Patient not taking: Reported on 07/23/2022)   vitamin E 400 UNIT capsule Take 400 Units by mouth daily.   No facility-administered encounter medications on file as of 08/01/2022.    Contacted [name] for General Review Call   Chart Review:  Have there been any documented new, changed, or discontinued medications since last visit? No  Has there been any documented recent hospitalizations or ED visits since last visit with Clinical Pharmacist? No Brief Summary (including medication and/or Diagnosis changes):   Adherence Review:  Does the Clinical Pharmacist Assistant have access to adherence rates? Yes Adherence rates for STAR metric medications (None noted) Adherence rates for medications indicated for disease state being reviewed (List medication(s)/day supply/ last 2 fill dates). Does the patient have >5 day gap between last estimated fill dates for any of the above medications or other medication gaps? No Reason for medication gaps.   Disease State Questions:  Able to connect with Patient? Yes  Did patient have any problems with their  health recently? No  Have you had any admissions or emergency room visits or worsening of your condition(s) since last visit? No  Have you had any visits with new specialists or providers since your last visit? No  Have you had any new health care problem(s) since your last visit? No  Have you run out of any of your medications since you last spoke with clinical pharmacist? No  Are there any medications you are not taking as prescribed? No  Are  you having any issues or side effects with your medications? No  Do you have any other health concerns or questions you want to discuss with your Clinical Pharmacist before your next visit? No  Are there any health concerns that you feel we can do a better job addressing? No  Are you having any problems with any of the following since the last visit: (select all that apply)  Driving  Details:Pt just doesn't like driving  12. Any falls since last visit? No   13. Any increased or uncontrolled pain since last visit? No   14. Next visit Type: telephone       Visit with:Nathan Kennedy CPP        Date:10/18/22        Time:10:00am  15. Additional Details? Yes, pt just reports she is still healing from her spine surgery but overall doing well.     [name], [place] Pharmacist Assistant  [phone]

## 2022-08-08 DIAGNOSIS — Z8719 Personal history of other diseases of the digestive system: Secondary | ICD-10-CM | POA: Diagnosis not present

## 2022-08-08 DIAGNOSIS — K5904 Chronic idiopathic constipation: Secondary | ICD-10-CM | POA: Diagnosis not present

## 2022-08-08 DIAGNOSIS — K219 Gastro-esophageal reflux disease without esophagitis: Secondary | ICD-10-CM | POA: Diagnosis not present

## 2022-09-16 ENCOUNTER — Other Ambulatory Visit: Payer: Self-pay | Admitting: Family Medicine

## 2022-09-19 ENCOUNTER — Other Ambulatory Visit: Payer: Self-pay

## 2022-10-18 ENCOUNTER — Ambulatory Visit: Payer: Medicare Other

## 2022-10-18 ENCOUNTER — Other Ambulatory Visit: Payer: Self-pay | Admitting: Family Medicine

## 2022-10-18 NOTE — Patient Outreach (Signed)
Care Management & Coordination Services Pharmacy Note  10/18/2022 Name:  Valerie Kelley MRN:  [id number] DOB:  10/20/1933   Recommendations/Changes made from today's visit: -Patient has no f/u scheduled with PCP. When I asked her to do so, she stated she won't until March when Covid is lighter. Counseled that telephone appts are available, she declined -BP is elevated, states her normal is 389'H systolic but she hasn't checked in weeks. Will check daily for next week and team will call in a week for numbers -Per last PCP note in May, PCP would like to DC amlodipine. However, it is still on patients medslist and patient is taking it. Will co-sign PCP to note. -Due for DEXA scan in 2023   Subjective: Valerie Kelley is an 89 y.o. year old female who is a primary patient of Cox, Kirsten, MD.  The care coordination team was consulted for assistance with disease management and care coordination needs.    Engaged with patient by telephone for follow up visit.  Recent office visits:  None   Recent consult visits:  None   Hospital visits:  None   Objective:  Lab Results  Component Value Date   CREATININE 1.36 (H) 12/08/2021   BUN 16 12/08/2021   EGFR 38 (L) 12/08/2021   GFRNONAA 41 (L) 10/06/2020   GFRAA 47 (L) 10/06/2020   NA 138 12/08/2021   K 4.5 12/08/2021   CALCIUM 9.9 12/08/2021   CO2 20 12/08/2021   GLUCOSE 91 12/08/2021    No results found for: "HGBA1C", "FRUCTOSAMINE", "GFR", "MICROALBUR"  Last diabetic Eye exam: No results found for: "HMDIABEYEEXA"  Last diabetic Foot exam: No results found for: "HMDIABFOOTEX"   Lab Results  Component Value Date   CHOL 300 (H) 08/24/2021   HDL 68 08/24/2021   LDLCALC 196 (H) 08/24/2021   TRIG 191 (H) 08/24/2021   CHOLHDL 4.4 08/24/2021       Latest Ref Rng & Units 12/08/2021   11:46 AM 11/02/2021    8:47 AM 08/24/2021    8:56 AM  Hepatic Function  Total Protein 6.0 - 8.5 g/dL 7.2  6.9  7.3   Albumin 3.6 - 4.6 g/dL 4.8   4.6  4.9   AST 0 - 40 IU/L '26  27  22   '$ ALT 0 - 32 IU/L '16  19  18   '$ Alk Phosphatase 44 - 121 IU/L 84  79  87   Total Bilirubin 0.0 - 1.2 mg/dL 0.2  0.3  0.4     Lab Results  Component Value Date/Time   TSH 6.120 (H) 01/22/2022 04:12 PM   TSH 2.530 11/02/2021 08:47 AM   FREET4 1.02 01/22/2022 04:12 PM   FREET4 1.07 02/24/2021 08:42 AM       Latest Ref Rng & Units 12/08/2021   11:46 AM 11/02/2021    8:47 AM 08/24/2021    8:56 AM  CBC  WBC 3.4 - 10.8 x10E3/uL 7.6  7.0  6.7   Hemoglobin 11.1 - 15.9 g/dL 13.7  12.3  13.5   Hematocrit 34.0 - 46.6 % 41.3  37.4  41.9   Platelets 150 - 450 x10E3/uL 306  265  269     Lab Results  Component Value Date/Time   VD25OH 38.0 12/08/2021 11:46 AM   VD25OH 55.0 08/25/2020 10:06 AM   VITAMINB12 1,901 (H) 08/25/2020 10:06 AM    Clinical ASCVD: No  The ASCVD Risk score (Arnett DK, et al., 2019) failed to calculate for the following  reasons:   The 2019 ASCVD risk score is only valid for ages 66 to 34    Other: (CHADS2VASc if Afib, MMRC or CAT for COPD, ACT, DEXA)     10/18/2022   10:09 AM 07/23/2022   10:12 AM 08/24/2021    8:18 AM  Depression screen PHQ 2/9  Decreased Interest 0 0 0  Down, Depressed, Hopeless 0 0 0  PHQ - 2 Score 0 0 0     Social History   Tobacco Use  Smoking Status Never  Smokeless Tobacco Never   BP Readings from Last 3 Encounters:  01/22/22 130/70  12/07/21 (!) 148/70  11/02/21 (!) 152/62   Pulse Readings from Last 3 Encounters:  01/22/22 63  12/07/21 78  11/02/21 76   Wt Readings from Last 3 Encounters:  01/22/22 92 lb (41.7 kg)  12/07/21 91 lb (41.3 kg)  11/02/21 94 lb (42.6 kg)   BMI Readings from Last 3 Encounters:  01/22/22 18.58 kg/m  12/07/21 18.38 kg/m  11/02/21 19.65 kg/m    Allergies  Allergen Reactions   Codeine Nausea And Vomiting and Nausea Only   Darvocet [Propoxyphene N-Acetaminophen] Nausea And Vomiting   Glucosamine    Other Hives and Nausea And Vomiting   Prednisone  Swelling and Other (See Comments)    Face swells and constant headache And headache   Pregabalin Other (See Comments)    Makes her feel drunk.   Shellfish Allergy Hives   Statins     Muscle pain.     Welchol [Colesevelam]     Muscle pain   Zetia [Ezetimibe]    Penicillins Rash    Medications Reviewed Today     Reviewed by Lane Hacker, [facility] (Pharmacist) on 10/18/22 at 1034  Med List Status: <None>   Medication Order Taking? Sig Documenting Provider Last Dose Status Informant  acetaminophen (TYLENOL) 650 MG CR tablet [id number] No Take 1 tablet (650 mg total) by mouth 3 (three) times daily as needed. Cox, Kirsten, MD Taking Active   albuterol (PROVENTIL HFA;VENTOLIN HFA) 108 (90 BASE) MCG/ACT inhaler [id number] No Inhale 2 puffs into the lungs every 6 (six) hours as needed for wheezing. For wheezing [provider] Taking Active Self  amLODipine (NORVASC) 5 MG tablet [id number]  TAKE 1 TABLET BY MOUTH DAILY Cox, Kirsten, MD  Active   b complex vitamins capsule [id number] No Take 1 capsule by mouth daily. [provider] Taking Active   Cholecalciferol (VITAMIN D3) 125 MCG (5000 UT) TBDP [id number] No Take 5,000 Units by mouth daily. [provider] Taking Active Self  cimetidine (TAGAMET) 300 MG tablet [id number] No Take 1 tablet (300 mg total) by mouth daily.  Patient not taking: Reported on 07/23/2022   CoxElnita Maxwell, MD Not Taking Active   co-enzyme Q-10 30 MG capsule [id number] No Take 30 mg by mouth daily. [provider] Taking Active   DULoxetine (CYMBALTA) 20 MG capsule [id number] No TAKE 1 CAPSULE BY MOUTH TWICE  DAILY Cox, Kirsten, MD Taking Active   EnovaRX-Naproxen 10 % CREA [id number] No Apply 1 application topically 4 (four) times daily as needed (joint pain). Cox, Kirsten, MD Taking Active   fish oil-omega-3 fatty acids 1000 MG capsule [id number] No Take 2 capsules by mouth every other day. Patient using wild alaskan salmon product with 2400 mg  [provider] Taking Active Self  Flaxseed Oil OIL [id number] No by Does not apply route every other day. [provider] Taking Active   fluticasone (FLONASE) 50  MCG/ACT nasal spray [id number] No Place into both nostrils. [provider] Taking Active   Glucosamine Sulfate 1000 MG CAPS [id number] No Take 1 capsule by mouth daily. 500 mg tablets [provider] Taking Active   loratadine (CLARITIN) 10 MG tablet [id number] No Take 10 mg by mouth daily as needed. [provider] Taking Active Self  Multiple Vitamins-Minerals (ALIVE WOMENS 50+) TABS [id number] No Take 1 tablet by mouth daily at 12 noon. [provider] Taking Active   omeprazole (PRILOSEC) 40 MG capsule [id number] No Take 40 mg by mouth daily. [provider] Taking Active   ondansetron (ZOFRAN-ODT) 4 MG disintegrating tablet [id number] No Take 4 mg by mouth every 6 (six) hours as needed.  Patient not taking: Reported on 07/23/2022   [provider] Not Taking Active   polyethylene glycol (MIRALAX / GLYCOLAX) packet [id number] No Take 51 g by mouth daily. Takes 3 capfuls daily [provider] Taking Active Self  senna (SENOKOT) 8.6 MG tablet [id number] No Take 1 tablet by mouth daily.  Patient not taking: Reported on 07/23/2022   [provider] Not Taking Active   SYMBICORT 80-4.5 MCG/ACT inhaler [id number] No Inhale into the lungs. [provider] Taking Active   traZODone (DESYREL) 50 MG tablet [id number] No TAKE 3 TABLETS BY MOUTH AT  BEDTIME [name], MD Taking Active   TURMERIC PO [id number] No Take 1 capsule by mouth daily.  Patient not taking: Reported on 07/23/2022   [provider] Not Taking Active   vitamin E 400 UNIT capsule [id number] No Take 400 Units by mouth daily. [provider] Taking Active Self            SDOH:  (Social Determinants of Health) assessments and interventions performed: Yes SDOH  Interventions    Flowsheet Row Care Coordination from 10/18/2022 in [place] Coordination from 07/23/2022 in [place] Management from 07/17/2021 in [place]  SDOH Interventions     Food Insecurity Interventions -- Intervention Not Indicated --  Housing Interventions -- Intervention Not Indicated --  Transportation Interventions -- Intervention Not Indicated --  Utilities Interventions -- Intervention Not Indicated --  Alcohol Usage Interventions -- Intervention Not Indicated (Score <7) --  Financial Strain Interventions Intervention Not Indicated Intervention Not Indicated Intervention Not Indicated  Physical Activity Interventions -- Intervention Not Indicated --  Stress Interventions -- Intervention Not Indicated --  Social Connections Interventions -- Intervention Not Indicated --       Medication Assistance: None required.  Patient affirms current coverage meets needs.   Name and location of Current pharmacy:  [facility], [address] [place] [address] Phone: [phone] Fax: [facility] [address] [place] [address] Phone: [phone] Fax: [phone]   Assessment/Plan   Hypertension (BP goal <130/80) BP Readings from Last 3 Encounters:  01/22/22 130/70  12/07/21 (!) 148/70  11/02/21 (!) 152/62  -Controlled -Current treatment: Amlodipine '5mg'$  Query Appropriate,  -Medications previously tried:  None reported -Current home readings:   Jan 2024: Patient hasn't checked in weeks but states normal for her is 373'S systolic -Current dietary habits: oatmeal for breakfast, potatoes, broccoli, carrots, chicken, mashed potatoes, snacks on junk food. Drinks 2 - 32 ounce bottles of water daily.  -Current exercise habits: walks to  mailbox and  works in Theatre stage manager -Denies hypotensive/hypertensive symptoms -Educated on BP goals and benefits of medications for prevention of heart attack, stroke and kidney damage; Daily salt intake goal < 2300 mg; Exercise goal of 150 minutes per week; Importance of home blood pressure monitoring; Proper BP monitoring technique; -Counseled to monitor BP at home weekly, document, and provide log at future appointments -Counseled on diet and exercise extensively Jan 2024: BP is elevated, states her normal is 502'D systolic but she hasn't checked in weeks. Will check daily for next week and team will call in a week for numbers   Hyperlipidemia: (LDL goal < 100) The ASCVD Risk score (Arnett DK, et al., 2019) failed to calculate for the following reasons:   The 2019 ASCVD risk score is only valid for ages 58 to 83 Lab Results  Component Value Date   CHOL 300 (H) 08/24/2021   CHOL 216 (H) 02/24/2021   CHOL 329 (H) 08/25/2020   Lab Results  Component Value Date   HDL 68 08/24/2021   HDL 61 02/24/2021   HDL 82 08/25/2020   Lab Results  Component Value Date   LDLCALC 196 (H) 08/24/2021   LDLCALC 123 (H) 02/24/2021   LDLCALC 224 (H) 08/25/2020   Lab Results  Component Value Date   TRIG 191 (H) 08/24/2021   TRIG 186 (H) 02/24/2021   TRIG 129 08/25/2020   Lab Results  Component Value Date   CHOLHDL 4.4 08/24/2021   CHOLHDL 3.5 02/24/2021   CHOLHDL 4.0 08/25/2020   No results found for: "LDLDIRECT" Last vitamin D Lab Results  Component Value Date   VD25OH 38.0 12/08/2021   Lab Results  Component Value Date   TSH 6.120 (H) 01/22/2022  -Uncontrolled -Current treatment: Fish oil 1000 mg 2 capsules daily Appropriate, Query effective,  Lecithin granules daily Query Appropriate,  Flaxseed oil daily Query Appropriate,  -Medications previously tried: statins, zetia (stomach problems - stopped February 2022) -Current dietary patterns: eats at home mainly. Potatoes, chicken and  oatmeal are most common foods that she can tolerate.   -Current exercise habits: walks to mail box and  -Educated on Cholesterol goals;  Benefits of statin for ASCVD risk reduction; Importance of limiting foods high in cholesterol; Exercise goal of 150 minutes per week; -Counseled on diet and exercise extensively Educated on benefits of cholesterol management and options of non-statin medications. Patient reports that her stomach is very sensitive to medications and she is unable to tolerate many things.  Patient declines Repatha or Nexletol.    Asthma (Goal: control symptoms and prevent exacerbations) -Controlled -Current treatment  Symbicort 1 puff daily prn Appropriate, Effective, Safe, Accessible Albuterol inhaler 2 puffs every 6 hours prn wheezing Appropriate, Effective, Safe, Accessible -Medications previously tried: none reported  -Exacerbations requiring treatment in last 6 months: none -Patient denies consistent use of maintenance inhaler -Frequency of rescue inhaler use: rarely -Counseled on Proper inhaler technique; -Assessed patient's breathing. She reports asthma flares with allergy season. She reports managing well currently and rarely using inhalers.  October 2022: Asked patient about med cost, she told me the only med she pays for is Symbicort. I explained the process of PAP and how I could try to fill one out for her, she adamantly denied it. I asked her multiple times and she denied it Jan 2024: Patient denies any problems   Osteoporosis / Osteopenia (Goal reduce risk of osteoporotic fracture) -Uncontrolled -Last DEXA Scan: 04/13/2020  T-Score femoral neck: -2.7             T-Score forearm radius: -2.6             -Patient is a candidate for pharmacologic treatment due to T-Score < -2.5 in femoral neck -Current treatment  Vitamin d 5000 units daily Appropriate, Effective, Safe, Accessible -Medications previously tried:  calcium (stopped due to  kidneys) -Recommend [phone] units of vitamin D daily. Recommend 1200 mg of calcium daily from dietary and supplemental sources. Recommend weight-bearing and muscle strengthening exercises for building and maintaining bone density. -Counseled on diet and exercise extensively Oct 2022: Recommended to continue current medication. Patient declines Prolia due to sensitivity to medications and she is unable to take calcium due to kidneys.  Jan 2024: Due for DEXA scan   Insomnia (Goal: improve sleep) -Controlled (Sleeping 8 hours/night) -Current treatment  Trazodone 50 mg daily Query Appropriate,  -Medications previously tried: none reported  -Recommended to continue current medication     Depression/Anxiety -Controlled -Current treatment: Duloxetine '20mg'$  BID Appropriate, Effective, Safe, Accessible -Medications previously tried/failed: N/A -PHQ9:     10/18/2022   10:09 AM 07/23/2022   10:12 AM 08/24/2021    8:18 AM  Depression screen PHQ 2/9  Decreased Interest 0 0 0  Down, Depressed, Hopeless 0 0 0  PHQ - 2 Score 0 0 0  -GAD7:     No data to display        -Educated on Benefits of medication for symptom control -Recommended to continue current medication  CPP F/U Feb 2025  [name], Pharm.D. - [phone]

## 2022-10-25 ENCOUNTER — Telehealth: Payer: Self-pay

## 2022-10-25 NOTE — Progress Notes (Signed)
10/25/22- Spoke with pt to get some updated BP readings  10/18/22 119/61 74 10/19/22 141/69 74 10/20/22 123/61 71 10/21/22 130/59 86 10/22/22 134/58 81 10/23/22 123/52 69 10/24/22 116/58 [place], [place] Production designer, theatre/television/film  [phone]

## 2022-10-25 NOTE — Telephone Encounter (Signed)
10/18/22 119/61 74 10/19/22 141/69 74 10/20/22 123/61 71 10/21/22 130/59 86 10/22/22 134/58 81 10/23/22 123/52 69 10/24/22 116/58 69   BP is significantly better than 016'Y systolic. Maintain therapy

## 2022-11-06 ENCOUNTER — Telehealth: Payer: Self-pay

## 2022-11-06 NOTE — Progress Notes (Signed)
Care Management & Coordination Services Pharmacy Team  Reason for Encounter: Hypertension  Contacted patient to discuss hypertension disease state. Spoke with patient on 11/09/2022     Current antihypertensive regimen:  Amlodipine 50m daily  Patient verbally confirms she is taking the above medications as directed. Yes  How often are you checking your Blood Pressure? daily  she checks her blood pressure in the morning before taking her medication.  Current home BP readings: 11/06/22 140/69 79, 11/07/22 146/59 78, 11/08/22 145/63 76, 11/09/22 137/66 71  Wrist or arm cuff: Arm  Caffeine intake:None Salt intake:Limited OTC medications including pseudoephedrine or NSAIDs?Tylenol Arthritis 650 mg daily   Any readings above 180/100? No  What recent interventions/DTPs have been made by any provider to improve Blood Pressure control since last CPP Visit: No changes   Any recent hospitalizations or ED visits since last visit with CPP? No  What diet changes have been made to improve Blood Pressure Control?  No diet changes   What exercise is being done to improve your Blood Pressure Control?  Pt cleans house and walks to the mailbox daily   Adherence Review: Is the patient currently on ACE/ARB medication? No Does the patient have >5 day gap between last estimated fill dates? No  Star Rating Drugs:  Medication:  Last Fill: Day Supply None noted   Chart Updates: Recent office visits:  None  Recent consult visits:  None  Hospital visits:  None  Medications: Outpatient Encounter Medications as of 11/06/2022  Medication Sig   acetaminophen (TYLENOL) 650 MG CR tablet Take 1 tablet (650 mg total) by mouth 3 (three) times daily as needed.   albuterol (PROVENTIL HFA;VENTOLIN HFA) 108 (90 BASE) MCG/ACT inhaler Inhale 2 puffs into the lungs every 6 (six) hours as needed for wheezing. For wheezing   amLODipine (NORVASC) 5 MG tablet TAKE 1 TABLET BY MOUTH DAILY   b complex vitamins  capsule Take 1 capsule by mouth daily.   Cholecalciferol (VITAMIN D3) 125 MCG (5000 UT) TBDP Take 5,000 Units by mouth daily.   cimetidine (TAGAMET) 300 MG tablet Take 1 tablet (300 mg total) by mouth daily. (Patient not taking: Reported on 07/23/2022)   co-enzyme Q-10 30 MG capsule Take 30 mg by mouth daily.   DULoxetine (CYMBALTA) 20 MG capsule TAKE 1 CAPSULE BY MOUTH TWICE  DAILY   EnovaRX-Naproxen 10 % CREA Apply 1 application topically 4 (four) times daily as needed (joint pain).   fish oil-omega-3 fatty acids 1000 MG capsule Take 2 capsules by mouth every other day. Patient using wild alaskan salmon product with 2400 mg   Flaxseed Oil OIL by Does not apply route every other day.   fluticasone (FLONASE) 50 MCG/ACT nasal spray Place into both nostrils.   Glucosamine Sulfate 1000 MG CAPS Take 1 capsule by mouth daily. 500 mg tablets   loratadine (CLARITIN) 10 MG tablet Take 10 mg by mouth daily as needed.   Multiple Vitamins-Minerals (ALIVE WOMENS 50+) TABS Take 1 tablet by mouth daily at 12 noon.   omeprazole (PRILOSEC) 40 MG capsule Take 40 mg by mouth daily.   ondansetron (ZOFRAN-ODT) 4 MG disintegrating tablet Take 4 mg by mouth every 6 (six) hours as needed. (Patient not taking: Reported on 07/23/2022)   polyethylene glycol (MIRALAX / GLYCOLAX) packet Take 51 g by mouth daily. Takes 3 capfuls daily   senna (SENOKOT) 8.6 MG tablet Take 1 tablet by mouth daily. (Patient not taking: Reported on 07/23/2022)   SYMBICORT 80-4.5 MCG/ACT inhaler Inhale  into the lungs.   traZODone (DESYREL) 50 MG tablet TAKE 3 TABLETS BY MOUTH AT  BEDTIME   TURMERIC PO Take 1 capsule by mouth daily. (Patient not taking: Reported on 07/23/2022)   vitamin E 400 UNIT capsule Take 400 Units by mouth daily.   No facility-administered encounter medications on file as of 11/06/2022.    Recent Office Vitals: BP Readings from Last 3 Encounters:  01/22/22 130/70  12/07/21 (!) 148/70  11/02/21 (!) 152/62   Pulse  Readings from Last 3 Encounters:  01/22/22 63  12/07/21 78  11/02/21 76    Wt Readings from Last 3 Encounters:  01/22/22 92 lb (41.7 kg)  12/07/21 91 lb (41.3 kg)  11/02/21 94 lb (42.6 kg)     Kidney Function Lab Results  Component Value Date/Time   CREATININE 1.36 (H) 12/08/2021 11:46 AM   CREATININE 1.24 (H) 11/02/2021 08:47 AM   GFRNONAA 41 (L) 10/06/2020 11:58 AM   GFRAA 47 (L) 10/06/2020 11:58 AM       Latest Ref Rng & Units 12/08/2021   11:46 AM 11/02/2021    8:47 AM 08/24/2021    8:56 AM  BMP  Glucose 70 - 99 mg/dL 91  84  89   BUN 8 - 27 mg/dL 16  20  16   $ Creatinine 0.57 - 1.00 mg/dL 1.36  1.24  1.33   BUN/Creat Ratio 12 - 28 12  16  12   $ Sodium 134 - 144 mmol/L 138  137  137   Potassium 3.5 - 5.2 mmol/L 4.5  4.8  4.3   Chloride 96 - 106 mmol/L 102  100  103   CO2 20 - 29 mmol/L 20  24  21   $ Calcium 8.7 - 10.3 mg/dL 9.9  9.5  9.4      [name], [place] Pharmacist Assistant  [phone]

## 2022-11-21 ENCOUNTER — Ambulatory Visit (INDEPENDENT_AMBULATORY_CARE_PROVIDER_SITE_OTHER): Payer: Medicare Other

## 2022-11-21 VITALS — BP 136/66 | HR 76

## 2022-11-21 DIAGNOSIS — S90122A Contusion of left lesser toe(s) without damage to nail, initial encounter: Secondary | ICD-10-CM | POA: Diagnosis not present

## 2022-11-21 DIAGNOSIS — W228XXA Striking against or struck by other objects, initial encounter: Secondary | ICD-10-CM | POA: Diagnosis not present

## 2022-11-21 DIAGNOSIS — M533 Sacrococcygeal disorders, not elsewhere classified: Secondary | ICD-10-CM | POA: Diagnosis not present

## 2022-11-21 DIAGNOSIS — Y998 Other external cause status: Secondary | ICD-10-CM | POA: Diagnosis not present

## 2022-11-21 DIAGNOSIS — I10 Essential (primary) hypertension: Secondary | ICD-10-CM | POA: Diagnosis not present

## 2022-11-21 DIAGNOSIS — S59902A Unspecified injury of left elbow, initial encounter: Secondary | ICD-10-CM | POA: Diagnosis not present

## 2022-11-21 DIAGNOSIS — Z043 Encounter for examination and observation following other accident: Secondary | ICD-10-CM | POA: Diagnosis not present

## 2022-11-21 DIAGNOSIS — Z0000 Encounter for general adult medical examination without abnormal findings: Secondary | ICD-10-CM | POA: Diagnosis not present

## 2022-11-21 DIAGNOSIS — M25552 Pain in left hip: Secondary | ICD-10-CM | POA: Diagnosis not present

## 2022-11-21 DIAGNOSIS — M542 Cervicalgia: Secondary | ICD-10-CM | POA: Diagnosis not present

## 2022-11-21 DIAGNOSIS — S50312A Abrasion of left elbow, initial encounter: Secondary | ICD-10-CM | POA: Diagnosis not present

## 2022-11-21 DIAGNOSIS — R519 Headache, unspecified: Secondary | ICD-10-CM | POA: Diagnosis not present

## 2022-11-21 DIAGNOSIS — S79912A Unspecified injury of left hip, initial encounter: Secondary | ICD-10-CM | POA: Diagnosis not present

## 2022-11-21 DIAGNOSIS — S0003XA Contusion of scalp, initial encounter: Secondary | ICD-10-CM | POA: Diagnosis not present

## 2022-11-21 DIAGNOSIS — M25522 Pain in left elbow: Secondary | ICD-10-CM | POA: Diagnosis not present

## 2022-11-21 NOTE — Patient Instructions (Signed)
Valerie Kelley , Thank you for taking time to come for your Medicare Wellness Visit. I appreciate your ongoing commitment to your health goals. Please review the following plan we discussed and let me know if I can assist you in the future.   Screening recommendations/referrals: Bone Density: Due (last done 2021 showing osteoporosis) Recommended yearly ophthalmology/optometry visit for glaucoma screening and checkup Recommended yearly dental visit for hygiene and checkup  Vaccinations: Influenza vaccine: up-to-date Pneumococcal vaccine: up-to-date Tdap vaccine: up-to-date Shingles vaccine: Complete    Advanced directives: Please bring a copy for your medical record   Preventive Care 65 Years and Older, Female   Preventive care refers to lifestyle choices and visits with your health care provider that can promote health and wellness.  What does preventive care include? A yearly physical exam. This is also called an annual well check. Dental exams once or twice a year. Routine eye exams. Ask your health care provider how often you should have your eyes checked. Personal lifestyle choices, including: Daily care of your teeth and gums. Regular physical activity. Eating a healthy diet. Avoiding tobacco and drug use. Limiting alcohol use. Practicing safe sex. Taking low-dose aspirin every day. Taking vitamin and mineral supplements as recommended by your health care provider.  What happens during an annual well check? The services and screenings done by your health care provider during your annual well check will depend on your age, overall health, lifestyle risk factors, and family history of disease.  Counseling Your health care provider may ask you questions about your: Alcohol use. Tobacco use. Drug use. Emotional well-being. Home and relationship well-being. Sexual activity. Eating habits. History of falls. Memory and ability to understand (cognition). Work and work  Statistician. Reproductive health.  Screening You may have the following tests or measurements: Height, weight, and BMI. Blood pressure. Lipid and cholesterol levels. These may be checked every 5 years, or more frequently if you are over 64 years old. Skin check. Lung cancer screening. You may have this screening every year starting at age 28 if you have a 30-pack-year history of smoking and currently smoke or have quit within the past 15 years. Fecal occult blood test (FOBT) of the stool. You may have this test every year starting at age 2. Flexible sigmoidoscopy or colonoscopy. You may have a sigmoidoscopy every 5 years or a colonoscopy every 10 years starting at age 58. Hepatitis C blood test. Hepatitis B blood test. Sexually transmitted disease (STD) testing. Diabetes screening. This is done by checking your blood sugar (glucose) after you have not eaten for a while (fasting). You may have this done every 1-3 years. Bone density scan. This is done to screen for osteoporosis. You may have this done starting at age 71. Mammogram. This may be done every 1-2 years. Talk to your health care provider about how often you should have regular mammograms. Talk with your health care provider about your test results, treatment options, and if necessary, the need for more tests.  Vaccines Your health care provider may recommend certain vaccines, such as: Influenza vaccine. This is recommended every year. Tetanus, diphtheria, and acellular pertussis (Tdap, Td) vaccine. You may need a Td booster every 10 years. Zoster vaccine. You may need this after age 41. Pneumococcal 13-valent conjugate (PCV13) vaccine. One dose is recommended after age 51. Pneumococcal polysaccharide (PPSV23) vaccine. One dose is recommended after age 43. Talk to your health care provider about which screenings and vaccines you need and how often you need them.  This information is not intended to replace advice given to you by  your health care provider. Make sure you discuss any questions you have with your health care provider. Document Released: 10/07/2015 Document Revised: 05/30/2016 Document Reviewed: 07/12/2015 Elsevier Interactive Patient Education  2017 [employer] Prevention in the Home  Falls can cause injuries. They can happen to people of all ages. There are many things you can do to make your home safe and to help prevent falls.  What can I do on the outside of my home? Regularly fix the edges of walkways and driveways and fix any cracks. Remove anything that might make you trip as you walk through a door, such as a raised step or threshold. Trim any bushes or trees on the path to your home. Use bright outdoor lighting. Clear any walking paths of anything that might make someone trip, such as rocks or tools. Regularly check to see if handrails are loose or broken. Make sure that both sides of any steps have handrails. Any raised decks and porches should have guardrails on the edges. Have any leaves, snow, or ice cleared regularly. Use sand or salt on walking paths during winter. Clean up any spills in your garage right away. This includes oil or grease spills.  What can I do in the bathroom? Use night lights. Install grab bars by the toilet and in the tub and shower. Do not use towel bars as grab bars. Use non-skid mats or decals in the tub or shower. If you need to sit down in the shower, use a plastic, non-slip stool. Keep the floor dry. Clean up any water that spills on the floor as soon as it happens. Remove soap buildup in the tub or shower regularly. Attach bath mats securely with double-sided non-slip rug tape. Do not have throw rugs and other things on the floor that can make you trip.  What can I do in the bedroom? Use night lights. Make sure that you have a light by your bed that is easy to reach. Do not use any sheets or blankets that are too big for your bed. They should  not hang down onto the floor. Have a firm chair that has side arms. You can use this for support while you get dressed. Do not have throw rugs and other things on the floor that can make you trip.  What can I do in the kitchen? Clean up any spills right away. Avoid walking on wet floors. Keep items that you use a lot in easy-to-reach places. If you need to reach something above you, use a strong step stool that has a grab bar. Keep electrical cords out of the way. Do not use floor polish or wax that makes floors slippery. If you must use wax, use non-skid floor wax. Do not have throw rugs and other things on the floor that can make you trip.  What can I do with my stairs? Do not leave any items on the stairs. Make sure that there are handrails on both sides of the stairs and use them. Fix handrails that are broken or loose. Make sure that handrails are as long as the stairways. Check any carpeting to make sure that it is firmly attached to the stairs. Fix any carpet that is loose or worn. Avoid having throw rugs at the top or bottom of the stairs. If you do have throw rugs, attach them to the floor with carpet tape. Make sure that  you have a light switch at the top of the stairs and the bottom of the stairs. If you do not have them, ask someone to add them for you.  What else can I do to help prevent falls? Wear shoes that: Do not have high heels. Have rubber bottoms. Are comfortable and fit you well. Are closed at the toe. Do not wear sandals. If you use a stepladder: Make sure that it is fully opened. Do not climb a closed stepladder. Make sure that both sides of the stepladder are locked into place. Ask someone to hold it for you, if possible. Clearly mark and make sure that you can see: Any grab bars or handrails. First and last steps. Where the edge of each step is. Use tools that help you move around (mobility aids) if they are needed. These  include: Canes. Walkers. Scooters. Crutches. Turn on the lights when you go into a dark area. Replace any light bulbs as soon as they burn out. Set up your furniture so you have a clear path. Avoid moving your furniture around. If any of your floors are uneven, fix them. If there are any pets around you, be aware of where they are. Review your medicines with your doctor. Some medicines can make you feel dizzy. This can increase your chance of falling. Ask your doctor what other things that you can do to help prevent falls.  This information is not intended to replace advice given to you by your health care provider. Make sure you discuss any questions you have with your health care provider. Document Released: 07/07/2009 Document Revised: 02/16/2016 Document Reviewed: 10/15/2014 Elsevier Interactive Patient Education  2017 [employer].

## 2022-11-21 NOTE — Progress Notes (Signed)
Subjective:   Valerie Kelley is a 89 y.o. female who presents for Medicare Annual (Subsequent) preventive examination. I connected with  Valerie Kelley on 11/21/22 by a audio enabled telemedicine application and verified that I am speaking with the correct person using two identifiers.  Patient Location: Home  Provider Location: Office/Clinic  I discussed the limitations of evaluation and management by telemedicine. The patient expressed understanding and agreed to proceed.  Cardiac Risk Factors include: advanced age (>43mn, >>51women);dyslipidemia     Objective:    Today's Vitals   11/21/22 1000  BP: 136/66  Pulse: 76  PainSc: 3    There is no height or weight on file to calculate BMI.     07/23/2022   10:22 AM 03/29/2021    9:40 AM 03/22/2020    9:52 AM 04/15/2013    9:05 PM 04/09/2013    2:31 PM  Advanced Directives  Does Patient Have a Medical Advance Directive? Yes Yes Yes Patient has advance directive, copy not in chart Patient has advance directive, copy not in chart  [place] of [place] Healthcare Power of [place] will    Does patient want to make changes to medical advance directive?  No - Patient declined No - Patient declined    Copy of [place]?  No - copy requested No - copy requested Copy requested from other (Comment)     Current Medications (verified) Outpatient Encounter Medications as of 11/21/2022  Medication Sig   acetaminophen (TYLENOL) 650 MG CR tablet Take 1 tablet (650 mg total) by mouth 3 (three) times daily as needed.   albuterol (PROVENTIL HFA;VENTOLIN HFA) 108 (90 BASE) MCG/ACT inhaler Inhale 2 puffs into the lungs every 6 (six) hours as needed for wheezing. For wheezing   amLODipine (NORVASC) 5 MG tablet TAKE 1 TABLET BY MOUTH DAILY   b complex vitamins capsule Take 1 capsule by mouth daily.   Cholecalciferol (VITAMIN D3) 125 MCG  (5000 UT) TBDP Take 5,000 Units by mouth daily.   cimetidine (TAGAMET) 300 MG tablet Take 1 tablet (300 mg total) by mouth daily.   co-enzyme Q-10 30 MG capsule Take 30 mg by mouth daily.   DULoxetine (CYMBALTA) 20 MG capsule TAKE 1 CAPSULE BY MOUTH TWICE  DAILY   EnovaRX-Naproxen 10 % CREA Apply 1 application topically 4 (four) times daily as needed (joint pain).   fish oil-omega-3 fatty acids 1000 MG capsule Take 2 capsules by mouth every other day. Patient using wild alask[place] salmon product with 2400 mg   Flaxseed Oil OIL by Does not apply route every other day.   fluticasone (FLONASE) 50 MCG/ACT nasal spray Place into both nostrils.   Glucosamine Sulfate 1000 MG CAPS Take 1 capsule by mouth daily. 500 mg tablets   loratadine (CLARITIN) 10 MG tablet Take 10 mg by mouth daily as needed.   Multiple Vitamins-Minerals (ALIVE WOMENS 50+) TABS Take 1 tablet by mouth daily at 12 noon.   omeprazole (PRILOSEC) 40 MG capsule Take 40 mg by mouth daily.   ondansetron (ZOFRAN-ODT) 4 MG disintegrating tablet Take 4 mg by mouth every 6 (six) hours as needed.   polyethylene glycol (MIRALAX / GLYCOLAX) packet Take 51 g by mouth daily. Takes 3 capfuls daily   senna (SENOKOT) 8.6 MG tablet Take 1 tablet by mouth daily.   SYMBICORT 80-4.5 MCG/ACT inhaler Inhale into the lungs.   traZODone (DESYREL) 50 MG tablet TAKE  3 TABLETS BY MOUTH AT  BEDTIME   TURMERIC PO Take 1 capsule by mouth daily.   vitamin E 400 UNIT capsule Take 400 Units by mouth daily.   No facility-administered encounter medications on file as of 11/21/2022.    Allergies (verified) Codeine, Darvocet [propoxyphene n-acetaminophen], Glucosamine, Other, Prednisone, Pregabalin, Shellfish allergy, Statins, Welchol [colesevelam], Zetia [ezetimibe], and Penicillins   History: Past Medical History:  Diagnosis Date   Arthritis    "all over" (04/15/2013)   Asthma    Emphysema    "mild" (04/15/2013)   Exertional shortness of breath    Gastric  paresis    GERD (gastroesophageal reflux disease)    H/O hiatal hernia    High cholesterol    Hypothyroidism    Myalgia due to statin    Stage III chronic kidney disease [facility])    Past Surgical History:  Procedure Laterality Date   ABDOMINAL HYSTERECTOMY     CARPAL TUNNEL RELEASE Left 11/06/2021   CT release.   CERVICAL FUSION  04/21/2022   REVERSE SHOULDER ARTHROPLASTY Left 04/15/2013   REVERSE SHOULDER ARTHROPLASTY Left 04/15/2013   Procedure: REVERSE SHOULDER ARTHROPLASTY LEFT;  Surgeon: Augustin Schooling, MD;  Location: [facility];  Service: Orthopedics;  Laterality: Left;   SINUS EXPLORATION     TONSILLECTOMY     Family History  Problem Relation Age of Onset   Stroke Mother    Cancer Brother        brain   Cancer Brother        leukemia   Cancer Brother        lung   Social History   Socioeconomic History   Marital status: Widowed    Spouse name: Not on file   Number of children: 2   Years of education: Not on file   Highest education level: Not on file  Occupational History   Occupation: Retired  Tobacco Use   Smoking status: Never   Smokeless tobacco: Never  Vaping Use   Vaping Use: Never used  Substance and Sexual Activity   Alcohol use: No   Drug use: No   Sexual activity: Not Currently  Other Topics Concern   Not on file  Social History Narrative   Not on file   Social Determinants of Health   Financial Resource Strain: Low Risk  (11/21/2022)   Overall Financial Resource Strain (CARDIA)    Difficulty of Paying Living Expenses: Not hard at all  Food Insecurity: No Food Insecurity (07/23/2022)   Hunger Vital Sign    Worried About Running Out of Food in the Last Year: Never true    [place] in the Last Year: Never true  Transportation Needs: No Transportation Needs (11/21/2022)   PRAPARE - Hydrologist (Medical): No    Lack of Transportation (Non-Medical): No  Physical Activity: Inactive (11/21/2022)   Exercise Vital  Sign    Days of Exercise per Week: 0 days    Minutes of Exercise per Session: 0 min  Stress: No Stress Concern Present (11/21/2022)   [place]    Feeling of Stress : Not at all  Social Connections: Socially Isolated (11/21/2022)   Social Connection and Isolation Panel [NHANES]    Frequency of Communication with Friends and Family: More than three times a week    Frequency of Social Gatherings with Friends and Family: More than three times a week    Attends Religious Services:  Never    Active Member of Clubs or Organizations: No    Attends Archivist Meetings: Never    Marital Status: Widowed    Tobacco Counseling Counseling given: Not Answered   Clinical Intake:  Pre-visit preparation completed: Yes Pain : 0-10 Pain Score: 3  Pain Type: Chronic pain Pain Location:  (Neck, Hips) Pain Frequency: Constant Pain Relieving Factors: [place], Tylenol Effect of Pain on Daily Activities: moderate Pain Relieving Factors: [place], Tylenol BMI - recorded: 18.57 Nutritional Status: BMI <19  Underweight Nutritional Risks: None Diabetes: No How often do you need to have someone help you when you read instructions, pamphlets, or other written materials from your doctor or pharmacy?: 2 - Rarely What is the last grade level you completed in school?: some college Interpreter Needed?: No      Activities of Daily Living    11/21/2022   10:46 AM  In your present state of health, do you have any difficulty performing the following activities:  Hearing? 0  Vision? 0  Difficulty concentrating or making decisions? 0  Walking or climbing stairs? 1  Dressing or bathing? 0  Doing errands, shopping? 0  Preparing Food and eating ? N  Using the Toilet? N  In the past six months, have you accidently leaked urine? Y  Do you have problems with loss of bowel control? N  Managing your Medications? N  Managing your  Finances? N  Housekeeping or managing your Housekeeping? N    Patient Care Team: [name], MD as PCP - General (Family Medicine) Scherrie November, MD as Referring Physician (Gastroenterology) Kyung Rudd., OD (Optometry) Suella Broad, MD as Consulting Physician (Physical Medicine and Rehabilitation) Gardiner Rhyme, MD as Referring Physician (Specialist) Thereasa Distance, MD as Referring Physician (Nephrology) Lane Hacker, [facility] (Pharmacist)     Assessment:   This is a routine wellness examination for [place].  Hearing/Vision screen No results found.  Dietary issues and exercise activities discussed: Current Exercise Habits: The patient does not participate in regular exercise at present, Exercise limited by: orthopedic condition(s)   Depression Screen    11/21/2022   10:23 AM 10/18/2022   10:09 AM 07/23/2022   10:12 AM 08/24/2021    8:18 AM 03/29/2021    9:41 AM 02/27/2021    2:24 PM 02/24/2021    8:04 AM  PHQ 2/9 Scores  PHQ - 2 Score 0 0 0 0 0 0 0  PHQ- 9 Score     3 3     Fall Risk    11/21/2022   10:44 AM 07/23/2022   10:21 AM 08/24/2021    8:18 AM 03/29/2021    9:40 AM 02/24/2021    8:03 AM  Fall Risk   Falls in the past year? 1 1 0 0 0  Number falls in past yr: 1 1 0 0 0  Injury with Fall? 0 1 1 0 0  Risk for fall due to : History of fall(s);Other (Comment)   Other (Comment)   Risk for fall due to: Comment no falls since surgery in July 2023   weakness   Follow up Falls evaluation completed;Education provided  Falls evaluation completed Falls evaluation completed;Education provided;Falls prevention discussed     FALL RISK PREVENTION PERTAINING TO THE HOME:  Any stairs in or around the home? No  If so, are there any without handrails? No  Home free of loose throw rugs in walkways, pet beds, electrical cords, etc? Yes  Adequate lighting in your home to  reduce risk of falls? Yes   ASSISTIVE DEVICES UTILIZED TO PREVENT FALLS:  Use of a cane, walker or  w/c? No  Grab bars in the bathroom? No  Shower chair or bench in shower? No  Elevated toilet seat or a handicapped toilet? No   Cognitive Function:        11/21/2022   10:47 AM 03/29/2021    9:42 AM 03/22/2020   11:09 AM  6CIT Screen  What Year? 0 points 0 points 0 points  What month? 0 points 0 points 0 points  What time? 0 points 0 points 0 points  Count back from 20 0 points 0 points 0 points  Months in reverse 2 points 2 points 2 points  Repeat phrase 2 points 0 points 2 points  Total Score 4 points 2 points 4 points    Immunizations Immunization History  Administered Date(s) Administered   COVID-19, mRNA, vaccine(Comirnaty)12 years and older 07/04/2022   Influenza Split 06/05/2017, 06/05/2019, 06/01/2020   Influenza, High Dose Seasonal PF 07/07/2021   Influenza-Unspecified 06/05/2017, 06/13/2022   PFIZER(Purple Top)SARS-COV-2 Vaccination 12/04/2019, 12/30/2019, 07/08/2020   Pfizer Covid-19 Vaccine Bivalent Booster 76yr & up 07/10/2021   Pneumococcal Conjugate-13 12/06/2014   Pneumococcal Polysaccharide-23 03/22/2020, 08/15/2021   Respiratory Syncytial Virus Vaccine,Recomb Aduvanted(Arexvy) 06/13/2022   Tdap 07/30/2012, 07/21/2021   Zoster Recombinat (Shingrix) 12/15/2020, 07/21/2021    TDAP status: Up to date  Flu Vaccine status: Up to date  Pneumococcal vaccine status: Up to date  Covid-19 vaccine status: Completed vaccines  Qualifies for Shingles Vaccine? Yes   Zostavax completed No   Shingrix Completed?: Yes  Screening Tests Health Maintenance  Topic Date Due   Medicare Annual Wellness (AWV)  03/29/2022   COVID-19 Vaccine (6 - 2023-24 season) 08/29/2022   DTaP/Tdap/Td (3 - Td or Tdap) 07/22/2031   Pneumonia Vaccine 89 Years old  Completed   INFLUENZA VACCINE  Completed   DEXA SCAN  Completed   Zoster Vaccines- Shingrix  Completed   HPV VACCINES  Aged Out    Health Maintenance  Health Maintenance Due  Topic Date Due   Medicare Annual Wellness  (AWV)  03/29/2022   COVID-19 Vaccine (6 - 2023-24 season) 08/29/2022    Colorectal cancer screening: No longer required.   Mammogram status: No longer required due to patient declines.  Bone Density status: Completed 03/2020. Results reflect: Bone density results: OSTEOPOROSIS. Patient does not care to schedule at this time.  Lung Cancer Screening: (Low Dose CT Chest recommended if Age 87-80years, 30 pack-year currently smoking OR have quit w/in 15years.) does not qualify.   Additional Screening:  Vision Screening: Recommended annual ophthalmology exams for early detection of glaucoma and other disorders of the eye. Is the patient up to date with their annual eye exam?  Yes  Who is the provider or what is the name of the office in which the patient attends annual eye exams[facility] Dental Screening: Recommended annual dental exams for proper oral hygiene  Community Resource Referral / Chronic Care Management: CRR required this visit?  No   CCM required this visit?  No      Plan:    1- Patient requesting refill on BP medication - deferred to provider.  Patient has upcoming appointment 12/20/22. 2- Discussed fall prevention - patient hasn't had any falls since her surgery this past July.  I have personally reviewed and noted the following in the patient's chart:   Medical and social history Use of alcohol, tobacco  or illicit drugs  Current medications and supplements including opioid prescriptions. Patient is not currently taking opioid prescriptions. Functional ability and status Nutritional status Physical activity Advanced directives List of other physicians Hospitalizations, surgeries, and ER visits in previous 12 months Vitals Screenings to include cognitive, depression, and falls Referrals and appointments  In addition, I have reviewed and discussed with patient certain preventive protocols, quality metrics, and best practice recommendations. A written  personalized care plan for preventive services as well as general preventive health recommendations were provided to patient.     Erie Noe, LPN   [id number]

## 2022-11-22 ENCOUNTER — Other Ambulatory Visit: Payer: Self-pay | Admitting: Family Medicine

## 2022-11-22 MED ORDER — AMLODIPINE BESYLATE 5 MG PO TABS: 5.0000 mg | ORAL_TABLET | Freq: Every day | ORAL | 0 refills | Status: DC

## 2022-11-25 ENCOUNTER — Other Ambulatory Visit: Payer: Self-pay | Admitting: Family Medicine

## 2022-11-25 MED ORDER — AMLODIPINE BESYLATE 5 MG PO TABS: 5.0000 mg | ORAL_TABLET | Freq: Every day | ORAL | 1 refills | Status: DC

## 2022-11-29 ENCOUNTER — Other Ambulatory Visit: Payer: Self-pay | Admitting: Family Medicine

## 2022-12-03 DIAGNOSIS — M4692 Unspecified inflammatory spondylopathy, cervical region: Secondary | ICD-10-CM | POA: Diagnosis not present

## 2022-12-07 ENCOUNTER — Telehealth: Payer: Self-pay

## 2022-12-07 NOTE — Progress Notes (Signed)
Care Management & Coordination Services Pharmacy Team  Reason for Encounter: Hypertension  Contacted patient to discuss hypertension disease state. Spoke with patient on 12/14/2022     Current antihypertensive regimen:  Amlodipine 5mg  daily Patient verbally confirms she is taking the above medications as directed. Yes  How often are you checking your Blood Pressure? daily  she checks her blood pressure in the morning before taking her medication.  Current home BP readings: 12/07/22 124/50 81, 12/09/22 139/57 81, 12/10/22 135/44 80, 12/11/22 125/58 70, 12/12/22 119/56 73, 12/13/22 118/64 70  Wrist or arm cuff: Arm  Caffeine intake:None Salt intake:Limited OTC medications including pseudoephedrine or NSAIDs?Tylenol Arthritis 650 mg daily  Any readings above 180/100? No  What recent interventions/DTPs have been made by any provider to improve Blood Pressure control since last CPP Visit: No changes   Any recent hospitalizations or ED visits since last visit with CPP? Yes, on 11/21/22 for a fall   What diet changes have been made to improve Blood Pressure Control?  No diet changes   What exercise is being done to improve your Blood Pressure Control?  Pt cleans house and walks on the treadmill and on her bike  Adherence Review: Is the patient currently on ACE/ARB medication? No Does the patient have >5 day gap between last estimated fill dates? No  Star Rating Drugs:  Medication:  Last Fill: Day Supply None noted   Chart Updates: Recent office visits:  11/21/22 Rhae Hammock LPN. Medicare Annual Wellness. No med changes.   Recent consult visits:  None  Hospital visits:  Medication Reconciliation was completed by comparing discharge summary, patient's EMR and Pharmacy list, and upon discussion with patient.  Admitted to the hospital on 11/21/22 due to Fall. Discharge date was 11/22/22. Discharged from [facility].    -All medications will remain the same.     Medications: Outpatient Encounter Medications as of 12/07/2022  Medication Sig   acetaminophen (TYLENOL) 650 MG CR tablet Take 1 tablet (650 mg total) by mouth 3 (three) times daily as needed.   albuterol (PROVENTIL HFA;VENTOLIN HFA) 108 (90 BASE) MCG/ACT inhaler Inhale 2 puffs into the lungs every 6 (six) hours as needed for wheezing. For wheezing   amLODipine (NORVASC) 5 MG tablet Take 1 tablet (5 mg total) by mouth daily.   b complex vitamins capsule Take 1 capsule by mouth daily.   Cholecalciferol (VITAMIN D3) 125 MCG (5000 UT) TBDP Take 5,000 Units by mouth daily.   cimetidine (TAGAMET) 300 MG tablet Take 1 tablet (300 mg total) by mouth daily.   co-enzyme Q-10 30 MG capsule Take 30 mg by mouth daily.   DULoxetine (CYMBALTA) 20 MG capsule TAKE 1 CAPSULE BY MOUTH TWICE  DAILY   EnovaRX-Naproxen 10 % CREA Apply 1 application topically 4 (four) times daily as needed (joint pain).   fish oil-omega-3 fatty acids 1000 MG capsule Take 2 capsules by mouth every other day. Patient using wild alaskan salmon product with 2400 mg   Flaxseed Oil OIL by Does not apply route every other day.   fluticasone (FLONASE) 50 MCG/ACT nasal spray Place into both nostrils.   Glucosamine Sulfate 1000 MG CAPS Take 1 capsule by mouth daily. 500 mg tablets   loratadine (CLARITIN) 10 MG tablet Take 10 mg by mouth daily as needed.   Multiple Vitamins-Minerals (ALIVE WOMENS 50+) TABS Take 1 tablet by mouth daily at 12 noon.   omeprazole (PRILOSEC) 40 MG capsule Take 40 mg by mouth daily.   ondansetron (  ZOFRAN-ODT) 4 MG disintegrating tablet Take 4 mg by mouth every 6 (six) hours as needed.   polyethylene glycol (MIRALAX / GLYCOLAX) packet Take 51 g by mouth daily. Takes 3 capfuls daily   senna (SENOKOT) 8.6 MG tablet Take 1 tablet by mouth daily.   SYMBICORT 80-4.5 MCG/ACT inhaler Inhale into the lungs.   traZODone (DESYREL) 50 MG tablet TAKE 3 TABLETS BY MOUTH AT  BEDTIME   TURMERIC PO Take 1 capsule by mouth  daily.   vitamin E 400 UNIT capsule Take 400 Units by mouth daily.   No facility-administered encounter medications on file as of 12/07/2022.    Recent Office Vitals: BP Readings from Last 3 Encounters:  11/21/22 136/66  01/22/22 130/70  12/07/21 (!) 148/70   Pulse Readings from Last 3 Encounters:  11/21/22 76  01/22/22 63  12/07/21 78    Wt Readings from Last 3 Encounters:  01/22/22 92 lb (41.7 kg)  12/07/21 91 lb (41.3 kg)  11/02/21 94 lb (42.6 kg)     Kidney Function Lab Results  Component Value Date/Time   CREATININE 1.36 (H) 12/08/2021 11:46 AM   CREATININE 1.24 (H) 11/02/2021 08:47 AM   GFRNONAA 41 (L) 10/06/2020 11:58 AM   GFRAA 47 (L) 10/06/2020 11:58 AM       Latest Ref Rng & Units 12/08/2021   11:46 AM 11/02/2021    8:47 AM 08/24/2021    8:56 AM  BMP  Glucose 70 - 99 mg/dL 91  84  89   BUN 8 - 27 mg/dL 16  20  16    Creatinine 0.57 - 1.00 mg/dL 1.36  1.24  1.33   BUN/Creat Ratio 12 - 28 12  16  12    Sodium 134 - 144 mmol/L 138  137  137   Potassium 3.5 - 5.2 mmol/L 4.5  4.8  4.3   Chloride 96 - 106 mmol/L 102  100  103   CO2 20 - 29 mmol/L 20  24  21    Calcium 8.7 - 10.3 mg/dL 9.9  9.5  9.4     [name], [facility] Pharmacist Assistant  [phone]

## 2022-12-19 NOTE — Progress Notes (Signed)
Subjective:  Patient ID: Valerie Kelley, female    DOB: 07-Sep-1934  Age: 89 y.o. MRN: [id number]  Chief Complaint  Patient presents with   Gastroesophageal Reflux   Hypertension    HPI Acquired hypothyroidism:not on levothyroxine.   Familial hyperlipidemia: otc fish oil 2 capsules every other day alternating with flax seed qod.   Neck pain/cervical stenosis. : patient sees Dr. Prince Rome with [facility]. Surgery  (C1-3 POSTERIOR SPINAL FUSION) performed 6.2023.  Patient has a stimulating device on her neck, but she is unsure what it is called Similar to a TENS unit.   [facility] 11/21/2022. Went to ED. Hit her head. Did numerous scans. No fractures. Unsure what caused her to fall.   HTN: Norvasc 5 mg daily. BP at home 132-146/ 60s. Pulse 60-70s.   GERD: Prilosec 40 mg daily  Insomnia: Trazodone 50 mg 3 oral qhs.  Asthma/COPD: low dose symbicort 2 puffs as needed rather than scheduled. Just stsarted claritin.   On cymbalta 20 mg once at night for hot flashes.      12/20/2022    8:48 AM 11/21/2022   10:23 AM 10/18/2022   10:09 AM 07/23/2022   10:12 AM 08/24/2021    8:18 AM  Depression screen PHQ 2/9  Decreased Interest 0 0 0 0 0  Down, Depressed, Hopeless 0 0 0 0 0  PHQ - 2 Score 0 0 0 0 0         03/29/2021    9:40 AM 08/24/2021    8:18 AM 07/23/2022   10:21 AM 11/21/2022   10:44 AM 12/20/2022    8:48 AM  Fall Risk  Falls in the past year? 0 0 1 1 1   Was there an injury with Fall? 0 1 1 0 1  Fall Risk Category Calculator 0 1 3 2 3   Fall Risk Category (Retired) Low Low High    (RETIRED) Patient Fall Risk Level Low fall risk      Patient at Risk for Falls Due to Other (Comment)   History of fall(s);Other (Comment) History of fall(s)  Patient at Risk for Falls Due to - Comments weakness   no falls since surgery in July 2023   Fall risk Follow up Falls evaluation completed;Education provided;Falls prevention discussed Falls evaluation completed  Falls evaluation completed;Education  provided Falls evaluation completed;Falls prevention discussed      Review of Systems  Constitutional:  Positive for fatigue. Negative for chills and fever.  HENT:  Negative for congestion, ear pain, rhinorrhea and sore throat.   Respiratory:  Negative for cough and shortness of breath.   Cardiovascular:  Negative for chest pain.  Gastrointestinal:  Negative for abdominal pain, constipation, diarrhea, nausea and vomiting.  Genitourinary:  Negative for dysuria and urgency.  Musculoskeletal:  Positive for arthralgias (diffuse). Negative for back pain and myalgias.  Neurological:  Negative for dizziness, weakness, light-headedness and headaches.       Balance issues  Psychiatric/Behavioral:  Negative for dysphoric mood. The patient is not nervous/anxious.     Current Outpatient Medications on File Prior to Visit  Medication Sig Dispense Refill   acetaminophen (TYLENOL) 650 MG CR tablet Take 1 tablet (650 mg total) by mouth 3 (three) times daily as needed. 270 tablet 0   albuterol (PROVENTIL HFA;VENTOLIN HFA) 108 (90 BASE) MCG/ACT inhaler Inhale 2 puffs into the lungs every 6 (six) hours as needed for wheezing. For wheezing     b complex vitamins capsule Take 1 capsule by mouth daily.  Cholecalciferol (VITAMIN D3) 125 MCG (5000 UT) TBDP Take 5,000 Units by mouth daily.     co-enzyme Q-10 30 MG capsule Take 30 mg by mouth daily.     DULoxetine (CYMBALTA) 20 MG capsule TAKE 1 CAPSULE BY MOUTH TWICE  DAILY 180 capsule 0   fish oil-omega-3 fatty acids 1000 MG capsule Take 2 capsules by mouth every other day. Patient using wild alaskan salmon product with 2400 mg     Flaxseed Oil OIL by Does not apply route every other day.     fluticasone (FLONASE) 50 MCG/ACT nasal spray Place into both nostrils.     Glucosamine Sulfate 1000 MG CAPS Take 1 capsule by mouth daily. 500 mg tablets     loratadine (CLARITIN) 10 MG tablet Take 10 mg by mouth daily as needed.     Multiple Vitamins-Minerals (ALIVE  WOMENS 50+) TABS Take 1 tablet by mouth daily at 12 noon.     omeprazole (PRILOSEC) 40 MG capsule Take 40 mg by mouth daily.     polyethylene glycol (MIRALAX / GLYCOLAX) packet Take 51 g by mouth daily. Takes 3 capfuls daily     SYMBICORT 80-4.5 MCG/ACT inhaler Inhale into the lungs.     traZODone (DESYREL) 50 MG tablet TAKE 3 TABLETS BY MOUTH AT  BEDTIME 270 tablet 0   vitamin E 400 UNIT capsule Take 400 Units by mouth daily.     No current facility-administered medications on file prior to visit.   Past Medical History:  Diagnosis Date   Arthritis    "all over" (04/15/2013)   Asthma    Emphysema    "mild" (04/15/2013)   Exertional shortness of breath    Gastric paresis    GERD (gastroesophageal reflux disease)    H/O hiatal hernia    High cholesterol    Hypothyroidism    Myalgia due to statin    Stage III chronic kidney disease [facility])    Past Surgical History:  Procedure Laterality Date   ABDOMINAL HYSTERECTOMY     CARPAL TUNNEL RELEASE Left 11/06/2021   CT release.   CERVICAL FUSION  04/21/2022   REVERSE SHOULDER ARTHROPLASTY Left 04/15/2013   REVERSE SHOULDER ARTHROPLASTY Left 04/15/2013   Procedure: REVERSE SHOULDER ARTHROPLASTY LEFT;  Surgeon: Augustin Schooling, MD;  Location: [place];  Service: Orthopedics;  Laterality: Left;   SINUS EXPLORATION     TONSILLECTOMY      Family History  Problem Relation Age of Onset   Stroke Mother    Cancer Brother        brain   Cancer Brother        leukemia   Cancer Brother        lung   Social History   Socioeconomic History   Marital status: Widowed    Spouse name: Not on file   Number of children: 2   Years of education: Not on file   Highest education level: Not on file  Occupational History   Occupation: Retired  Tobacco Use   Smoking status: Never   Smokeless tobacco: Never  Vaping Use   Vaping Use: Never used  Substance and Sexual Activity   Alcohol use: No   Drug use: No   Sexual activity: Not Currently   Other Topics Concern   Not on file  Social History Narrative   Not on file   Social Determinants of Health   Financial Resource Strain: Low Risk  (11/21/2022)   Overall Financial Resource Strain (CARDIA)  Difficulty of Paying Living Expenses: Not hard at all  Food Insecurity: No Food Insecurity (07/23/2022)   Hunger Vital Sign    Worried About Running Out of Food in the Last Year: Never true    Ran Out of Food in the Last Year: Never true  Transportation Needs: No Transportation Needs (11/21/2022)   PRAPARE - Hydrologist (Medical): No    Lack of Transportation (Non-Medical): No  Physical Activity: Inactive (11/21/2022)   Exercise Vital Sign    Days of Exercise per Week: 0 days    Minutes of Exercise per Session: 0 min  Stress: No Stress Concern Present (11/21/2022)   [place]    Feeling of Stress : Not at all  Social Connections: Socially Isolated (11/21/2022)   Social Connection and Isolation Panel [NHANES]    Frequency of Communication with Friends and Family: More than three times a week    Frequency of Social Gatherings with Friends and Family: More than three times a week    Attends Religious Services: Never    Marine scientist or Organizations: No    Attends Archivist Meetings: Never    Marital Status: Widowed    Objective:  BP (!) 135/59 Comment: at home  Pulse 78   Temp (!) 96.4 F (35.8 C)   Resp 18   Ht 4\' 9"  (1.448 m)   Wt 95 lb 6.4 oz (43.3 kg)   BMI 20.64 kg/m      12/20/2022    9:23 AM 12/20/2022    9:19 AM 12/20/2022    8:53 AM  BP/Weight  Systolic BP [id number]  Diastolic BP 59 60 70    Physical Exam Vitals reviewed.  Constitutional:      Appearance: Normal appearance. She is normal weight.  Neck:     Vascular: No carotid bruit.  Cardiovascular:     Rate and Rhythm: Normal rate and regular rhythm.     Heart sounds: Normal heart  sounds.  Pulmonary:     Effort: Pulmonary effort is normal. No respiratory distress.     Breath sounds: Normal breath sounds.  Abdominal:     General: Abdomen is flat. Bowel sounds are normal.     Palpations: Abdomen is soft.     Tenderness: There is no abdominal tenderness.  Neurological:     Mental Status: She is alert and oriented to person, place, and time.  Psychiatric:        Mood and Affect: Mood normal.        Behavior: Behavior normal.     Diabetic Foot Exam - Simple   No data filed      Lab Results  Component Value Date   WBC 7.3 12/20/2022   HGB 12.0 12/20/2022   HCT 36.3 12/20/2022   PLT 263 12/20/2022   GLUCOSE 89 12/20/2022   CHOL 286 (H) 12/20/2022   TRIG 177 (H) 12/20/2022   HDL 64 12/20/2022   LDLCALC 189 (H) 12/20/2022   ALT 14 12/20/2022   AST 23 12/20/2022   NA 138 12/20/2022   K 4.4 12/20/2022   CL 101 12/20/2022   CREATININE 1.39 (H) 12/20/2022   BUN 20 12/20/2022   CO2 21 12/20/2022   TSH 13.000 (H) 12/20/2022      Assessment & Plan:    Acquired hypothyroidism Assessment & Plan: Recheck TSH and adjust Synthroid as indicated    Orders: -  TSH  Benign hypertension -     CBC with Differential/Platelet -     Comprehensive metabolic panel  Familial hyperlipidemia Assessment & Plan: Recommend continue to work on eating healthy diet and exercise. Check labs.   Orders: -     Lipid panel  Gastroesophageal reflux disease without esophagitis  Moderate persistent asthma without complication Assessment & Plan: The current medical regimen is effective;  continue present plan and medications. Continue low dose symbicort, but prefer twice daily scheduled.    Anemia due to stage 3b chronic kidney disease [facility]) Assessment & Plan: Check labs.    Spinal stenosis in cervical region Assessment & Plan: Management per specialist.     Hypertensive chronic kidney disease w stg 1-4/unsp chr kdny Assessment & Plan: Well controlled.   No changes to medicines. Continue norvasc 5 mg daily.  Continue to work on eating a healthy diet and exercise.  Labs drawn today.     Stage 3b chronic kidney disease ([place]) Assessment & Plan: stable   Other orders -     amLODIPine Besylate; Take 1 tablet (5 mg total) by mouth daily.  Dispense: 90 tablet; Refill: 1 -     Cardiovascular Risk Assessment     Meds ordered this encounter  Medications   amLODipine (NORVASC) 5 MG tablet    Sig: Take 1 tablet (5 mg total) by mouth daily.    Dispense:  90 tablet    Refill:  1    Orders Placed This Encounter  Procedures   CBC with Differential/Platelet   Comprehensive metabolic panel   Lipid panel   TSH   Cardiovascular Risk Assessment     Follow-up: Return in about 6 months (around 06/22/2023) for chronic fasting.   I,Katherina A Bramblett,acting as a scribe for [name], MD.,have documented all relevant documentation on the behalf of [name], MD,as directed by  [name], MD while in the presence of [name], MD.   An After Visit Summary was printed and given to the patient.  I attest that I have reviewed this visit and agree with the plan scribed by my staff.   [name], MD [facility] [phone]

## 2022-12-20 ENCOUNTER — Encounter: Payer: Self-pay | Admitting: Family Medicine

## 2022-12-20 ENCOUNTER — Ambulatory Visit (INDEPENDENT_AMBULATORY_CARE_PROVIDER_SITE_OTHER): Payer: Medicare Other | Admitting: Family Medicine

## 2022-12-20 VITALS — BP 135/59 | HR 78 | Temp 96.4°F | Resp 18 | Ht <= 58 in | Wt 95.4 lb

## 2022-12-20 DIAGNOSIS — E039 Hypothyroidism, unspecified: Secondary | ICD-10-CM | POA: Diagnosis not present

## 2022-12-20 DIAGNOSIS — N1832 Chronic kidney disease, stage 3b: Secondary | ICD-10-CM

## 2022-12-20 DIAGNOSIS — M4802 Spinal stenosis, cervical region: Secondary | ICD-10-CM

## 2022-12-20 DIAGNOSIS — I10 Essential (primary) hypertension: Secondary | ICD-10-CM | POA: Diagnosis not present

## 2022-12-20 DIAGNOSIS — J4540 Moderate persistent asthma, uncomplicated: Secondary | ICD-10-CM | POA: Diagnosis not present

## 2022-12-20 DIAGNOSIS — I129 Hypertensive chronic kidney disease with stage 1 through stage 4 chronic kidney disease, or unspecified chronic kidney disease: Secondary | ICD-10-CM | POA: Diagnosis not present

## 2022-12-20 DIAGNOSIS — K219 Gastro-esophageal reflux disease without esophagitis: Secondary | ICD-10-CM

## 2022-12-20 DIAGNOSIS — E7849 Other hyperlipidemia: Secondary | ICD-10-CM

## 2022-12-20 DIAGNOSIS — D631 Anemia in chronic kidney disease: Secondary | ICD-10-CM

## 2022-12-20 MED ORDER — AMLODIPINE BESYLATE 5 MG PO TABS: 5.0000 mg | ORAL_TABLET | Freq: Every day | ORAL | 1 refills | Status: DC

## 2022-12-21 LAB — CBC WITH DIFFERENTIAL/PLATELET
Basophils Absolute: 0 10*3/uL (ref 0.0–0.2)
Basos: 0 %
EOS (ABSOLUTE): 0.1 10*3/uL (ref 0.0–0.4)
Eos: 1 %
Hematocrit: 36.3 % (ref 34.0–46.6)
Hemoglobin: 12 g/dL (ref 11.1–15.9)
Immature Grans (Abs): 0 10*3/uL (ref 0.0–0.1)
Immature Granulocytes: 0 %
Lymphocytes Absolute: 2.6 10*3/uL (ref 0.7–3.1)
Lymphs: 35 %
MCH: 29.6 pg (ref 26.6–33.0)
MCHC: 33.1 g/dL (ref 31.5–35.7)
MCV: 90 fL (ref 79–97)
Monocytes Absolute: 0.9 10*3/uL (ref 0.1–0.9)
Monocytes: 12 %
Neutrophils Absolute: 3.7 10*3/uL (ref 1.4–7.0)
Neutrophils: 52 %
Platelets: 263 10*3/uL (ref 150–450)
RBC: 4.05 x10E6/uL (ref 3.77–5.28)
RDW: 14 % (ref 11.7–15.4)
WBC: 7.3 10*3/uL (ref 3.4–10.8)

## 2022-12-21 LAB — COMPREHENSIVE METABOLIC PANEL
ALT: 14 IU/L (ref 0–32)
AST: 23 IU/L (ref 0–40)
Albumin/Globulin Ratio: 1.7 (ref 1.2–2.2)
Albumin: 4.6 g/dL (ref 3.7–4.7)
Alkaline Phosphatase: 166 IU/L — ABNORMAL HIGH (ref 44–121)
BUN/Creatinine Ratio: 14 (ref 12–28)
BUN: 20 mg/dL (ref 8–27)
Bilirubin Total: 0.3 mg/dL (ref 0.0–1.2)
CO2: 21 mmol/L (ref 20–29)
Calcium: 9.7 mg/dL (ref 8.7–10.3)
Chloride: 101 mmol/L (ref 96–106)
Creatinine, Ser: 1.39 mg/dL — ABNORMAL HIGH (ref 0.57–1.00)
Globulin, Total: 2.7 g/dL (ref 1.5–4.5)
Glucose: 89 mg/dL (ref 70–99)
Potassium: 4.4 mmol/L (ref 3.5–5.2)
Sodium: 138 mmol/L (ref 134–144)
Total Protein: 7.3 g/dL (ref 6.0–8.5)
eGFR: 37 mL/min/{1.73_m2} — ABNORMAL LOW (ref >59)

## 2022-12-21 LAB — LIPID PANEL
Chol/HDL Ratio: 4.5 ratio — ABNORMAL HIGH (ref 0.0–4.4)
Cholesterol, Total: 286 mg/dL — ABNORMAL HIGH (ref 100–199)
HDL: 64 mg/dL (ref >39)
LDL Chol Calc (NIH): 189 mg/dL — ABNORMAL HIGH (ref 0–99)
Triglycerides: 177 mg/dL — ABNORMAL HIGH (ref 0–149)
VLDL Cholesterol Cal: 33 mg/dL (ref 5–40)

## 2022-12-21 LAB — TSH: TSH: 13 u[IU]/mL — ABNORMAL HIGH (ref 0.450–4.500)

## 2022-12-23 DIAGNOSIS — N1832 Chronic kidney disease, stage 3b: Secondary | ICD-10-CM

## 2022-12-23 DIAGNOSIS — I10 Essential (primary) hypertension: Secondary | ICD-10-CM | POA: Insufficient documentation

## 2022-12-23 HISTORY — DX: Chronic kidney disease, stage 3b: N18.32

## 2022-12-23 NOTE — Assessment & Plan Note (Signed)
stable °

## 2022-12-23 NOTE — Assessment & Plan Note (Signed)
Management per specialist. 

## 2022-12-23 NOTE — Progress Notes (Signed)
Blood count normal.  Liver function normal.  Kidney function abnormal. Stable. Thyroid function abnormal. Start synthroid 50 mcg once daily Cholesterol: VERY HIGH. LDL HIGH AND TRIGS UP. Recommend repatha 140 mg every 2 weeks. Patient has familial hyperlipidemia.

## 2022-12-23 NOTE — Assessment & Plan Note (Signed)
Well controlled.  No changes to medicines. Continue norvasc 5 mg daily.  Continue to work on eating a healthy diet and exercise.  Labs drawn today.

## 2022-12-23 NOTE — Assessment & Plan Note (Signed)
The current medical regimen is effective;  continue present plan and medications. Continue low dose symbicort, but prefer twice daily scheduled.

## 2022-12-23 NOTE — Assessment & Plan Note (Signed)
Check labs 

## 2022-12-23 NOTE — Assessment & Plan Note (Signed)
Recheck TSH and adjust Synthroid as indicated

## 2022-12-23 NOTE — Assessment & Plan Note (Signed)
>>  ASSESSMENT AND PLAN FOR CKD STAGE 3B, GFR 30-44 ML/MIN (HCC) WRITTEN ON 12/23/2022  8:25 PM BY COX, KIRSTEN, MD  stable

## 2022-12-23 NOTE — Assessment & Plan Note (Signed)
Recommend continue to work on eating healthy diet and exercise. ?Check labs. ?

## 2023-01-03 DIAGNOSIS — N1832 Chronic kidney disease, stage 3b: Secondary | ICD-10-CM | POA: Diagnosis not present

## 2023-01-03 DIAGNOSIS — M898X9 Other specified disorders of bone, unspecified site: Secondary | ICD-10-CM | POA: Insufficient documentation

## 2023-01-03 DIAGNOSIS — E559 Vitamin D deficiency, unspecified: Secondary | ICD-10-CM | POA: Diagnosis not present

## 2023-01-03 DIAGNOSIS — I129 Hypertensive chronic kidney disease with stage 1 through stage 4 chronic kidney disease, or unspecified chronic kidney disease: Secondary | ICD-10-CM | POA: Diagnosis not present

## 2023-01-03 DIAGNOSIS — D631 Anemia in chronic kidney disease: Secondary | ICD-10-CM | POA: Diagnosis not present

## 2023-01-14 DIAGNOSIS — J301 Allergic rhinitis due to pollen: Secondary | ICD-10-CM | POA: Diagnosis not present

## 2023-01-14 DIAGNOSIS — J4520 Mild intermittent asthma, uncomplicated: Secondary | ICD-10-CM | POA: Diagnosis not present

## 2023-01-14 DIAGNOSIS — R0600 Dyspnea, unspecified: Secondary | ICD-10-CM | POA: Diagnosis not present

## 2023-01-22 ENCOUNTER — Other Ambulatory Visit: Payer: Self-pay

## 2023-01-22 DIAGNOSIS — J449 Chronic obstructive pulmonary disease, unspecified: Secondary | ICD-10-CM | POA: Insufficient documentation

## 2023-01-22 DIAGNOSIS — N183 Chronic kidney disease, stage 3 unspecified: Secondary | ICD-10-CM | POA: Insufficient documentation

## 2023-01-22 DIAGNOSIS — R0600 Dyspnea, unspecified: Secondary | ICD-10-CM | POA: Insufficient documentation

## 2023-01-22 DIAGNOSIS — Z8719 Personal history of other diseases of the digestive system: Secondary | ICD-10-CM | POA: Insufficient documentation

## 2023-01-22 DIAGNOSIS — R0602 Shortness of breath: Secondary | ICD-10-CM | POA: Insufficient documentation

## 2023-01-22 DIAGNOSIS — M1990 Unspecified osteoarthritis, unspecified site: Secondary | ICD-10-CM | POA: Insufficient documentation

## 2023-01-22 DIAGNOSIS — J309 Allergic rhinitis, unspecified: Secondary | ICD-10-CM | POA: Insufficient documentation

## 2023-01-22 DIAGNOSIS — K219 Gastro-esophageal reflux disease without esophagitis: Secondary | ICD-10-CM | POA: Insufficient documentation

## 2023-01-22 DIAGNOSIS — M791 Myalgia, unspecified site: Secondary | ICD-10-CM | POA: Insufficient documentation

## 2023-01-22 DIAGNOSIS — J45909 Unspecified asthma, uncomplicated: Secondary | ICD-10-CM | POA: Insufficient documentation

## 2023-01-29 DIAGNOSIS — R0600 Dyspnea, unspecified: Secondary | ICD-10-CM | POA: Diagnosis not present

## 2023-01-29 DIAGNOSIS — J301 Allergic rhinitis due to pollen: Secondary | ICD-10-CM | POA: Diagnosis not present

## 2023-01-29 DIAGNOSIS — J4520 Mild intermittent asthma, uncomplicated: Secondary | ICD-10-CM | POA: Diagnosis not present

## 2023-02-05 ENCOUNTER — Telehealth: Payer: Self-pay

## 2023-02-05 DIAGNOSIS — S0502XA Injury of conjunctiva and corneal abrasion without foreign body, left eye, initial encounter: Secondary | ICD-10-CM | POA: Diagnosis not present

## 2023-02-05 NOTE — Progress Notes (Signed)
Care Management & Coordination Services Pharmacy Team  Reason for Encounter: Hypertension  Contacted patient to discuss hypertension disease state. Spoke with patient on 02/13/2023     Current antihypertensive regimen:  Amlodipine 5mg  daily  Patient verbally confirms she is taking the above medications as directed. Yes  How often are you checking your Blood Pressure? daily  she checks her blood pressure in the morning before taking her medication.  Current home BP readings:  02/06/23 122/65 68 02/07/23 109/62 78 02/10/23 123/69 67 02/12/23 116/52 63 02/13/23 120/55 70  Wrist or arm cuff: Arm  Caffeine intake:None Salt intake:Limited OTC medications including pseudoephedrine or NSAIDs?Tylenol Arthritis 650 mg daily  Any readings above 180/100? No  What recent interventions/DTPs have been made by any provider to improve Blood Pressure control since last CPP Visit: No recent changes  Any recent hospitalizations or ED visits since last visit with CPP? Yes, on 11/21/22 for a fall   What diet changes have been made to improve Blood Pressure Control?  No diet changes  What exercise is being done to improve your Blood Pressure Control?  Pt cleans house and walks on the treadmill and on her bike  Adherence Review: Is the patient currently on ACE/ARB medication? No Does the patient have >5 day gap between last estimated fill dates? No  Star Rating Drugs:  Medication:  Last Fill: Day Supply None noted   Chart Updates: Recent office visits:  12/20/22 Blane Ohara MD. Seen for follow up. D/C Cimetidine 300mg , Naproxen 10%, Ondansetron 4mg , Sennosides 0.6mg  and Turmeric.   Recent consult visits:  01/29/23 Vikki Ports MD. (Pulmonologist) Seen for Dyspnea. Given samples of the Trelegy inhaler.   01/14/23 Vikki Ports MD. (Pulmonologist) Seen for Dyspnea. No med changes  01/03/23 Lisette Abu MD (Nephrology). Seen for HTN. No med changes.   Hospital visits:   None  Medications: Outpatient Encounter Medications as of 02/05/2023  Medication Sig   acetaminophen (TYLENOL) 650 MG CR tablet Take 1 tablet (650 mg total) by mouth 3 (three) times daily as needed.   albuterol (PROVENTIL HFA;VENTOLIN HFA) 108 (90 BASE) MCG/ACT inhaler Inhale 2 puffs into the lungs every 6 (six) hours as needed for wheezing. For wheezing   amLODipine (NORVASC) 5 MG tablet Take 1 tablet (5 mg total) by mouth daily.   b complex vitamins capsule Take 1 capsule by mouth daily.   Cholecalciferol (VITAMIN D3) 125 MCG (5000 UT) TBDP Take 5,000 Units by mouth daily.   co-enzyme Q-10 30 MG capsule Take 30 mg by mouth daily.   DULoxetine (CYMBALTA) 20 MG capsule TAKE 1 CAPSULE BY MOUTH TWICE  DAILY   fish oil-omega-3 fatty acids 1000 MG capsule Take 2 capsules by mouth every other day. Patient using wild alaskan salmon product with 2400 mg   Flaxseed Oil OIL 1 capsule by Does not apply route every other day.   fluticasone (FLONASE) 50 MCG/ACT nasal spray Place 1 spray into both nostrils daily.   Glucosamine Sulfate 1000 MG CAPS Take 1 capsule by mouth daily. 500 mg tablets   loratadine (CLARITIN) 10 MG tablet Take 10 mg by mouth daily as needed for allergies or rhinitis.   Multiple Vitamins-Minerals (ALIVE WOMENS 50+) TABS Take 1 tablet by mouth daily at 12 noon.   omeprazole (PRILOSEC) 40 MG capsule Take 40 mg by mouth daily.   polyethylene glycol (MIRALAX / GLYCOLAX) packet Take 51 g by mouth daily. Takes 3 capfuls daily   SYMBICORT 80-4.5 MCG/ACT inhaler Inhale 2 puffs into the  lungs in the morning and at bedtime.   traZODone (DESYREL) 50 MG tablet TAKE 3 TABLETS BY MOUTH AT  BEDTIME   vitamin E 400 UNIT capsule Take 400 Units by mouth daily.   No facility-administered encounter medications on file as of 02/05/2023.    Recent Office Vitals: BP Readings from Last 3 Encounters:  12/20/22 (!) 135/59  11/21/22 136/66  01/22/22 130/70   Pulse Readings from Last 3 Encounters:   12/20/22 78  11/21/22 76  01/22/22 63    Wt Readings from Last 3 Encounters:  12/20/22 95 lb 6.4 oz (43.3 kg)  01/22/22 92 lb (41.7 kg)  12/07/21 91 lb (41.3 kg)     Kidney Function Lab Results  Component Value Date/Time   CREATININE 1.39 (H) 12/20/2022 09:29 AM   CREATININE 1.36 (H) 12/08/2021 11:46 AM   GFRNONAA 41 (L) 10/06/2020 11:58 AM   GFRAA 47 (L) 10/06/2020 11:58 AM       Latest Ref Rng & Units 12/20/2022    9:29 AM 12/08/2021   11:46 AM 11/02/2021    8:47 AM  BMP  Glucose 70 - 99 mg/dL 89  91  84   BUN 8 - 27 mg/dL 20  16  20    Creatinine 0.57 - 1.00 mg/dL 1.61  0.96  0.45   BUN/Creat Ratio 12 - 28 14  12  16    Sodium 134 - 144 mmol/L 138  138  137   Potassium 3.5 - 5.2 mmol/L 4.4  4.5  4.8   Chloride 96 - 106 mmol/L 101  102  100   CO2 20 - 29 mmol/L 21  20  24    Calcium 8.7 - 10.3 mg/dL 9.7  9.9  9.5      Roxana Hires, [facility] Clinical Pharmacist Assistant  [phone]

## 2023-02-11 DIAGNOSIS — J439 Emphysema, unspecified: Secondary | ICD-10-CM | POA: Diagnosis not present

## 2023-02-11 DIAGNOSIS — I700 Atherosclerosis of aorta: Secondary | ICD-10-CM | POA: Diagnosis not present

## 2023-02-11 DIAGNOSIS — R0600 Dyspnea, unspecified: Secondary | ICD-10-CM | POA: Diagnosis not present

## 2023-02-11 DIAGNOSIS — I728 Aneurysm of other specified arteries: Secondary | ICD-10-CM | POA: Diagnosis not present

## 2023-02-11 DIAGNOSIS — J479 Bronchiectasis, uncomplicated: Secondary | ICD-10-CM | POA: Diagnosis not present

## 2023-02-11 DIAGNOSIS — I2510 Atherosclerotic heart disease of native coronary artery without angina pectoris: Secondary | ICD-10-CM | POA: Diagnosis not present

## 2023-02-12 ENCOUNTER — Ambulatory Visit: Payer: Medicare Other | Admitting: Cardiology

## 2023-02-12 DIAGNOSIS — S0502XA Injury of conjunctiva and corneal abrasion without foreign body, left eye, initial encounter: Secondary | ICD-10-CM | POA: Diagnosis not present

## 2023-03-08 ENCOUNTER — Other Ambulatory Visit: Payer: Self-pay | Admitting: Family Medicine

## 2023-03-12 DIAGNOSIS — R918 Other nonspecific abnormal finding of lung field: Secondary | ICD-10-CM | POA: Diagnosis not present

## 2023-03-12 DIAGNOSIS — J479 Bronchiectasis, uncomplicated: Secondary | ICD-10-CM | POA: Diagnosis not present

## 2023-03-12 DIAGNOSIS — J4520 Mild intermittent asthma, uncomplicated: Secondary | ICD-10-CM | POA: Diagnosis not present

## 2023-03-12 DIAGNOSIS — J301 Allergic rhinitis due to pollen: Secondary | ICD-10-CM | POA: Diagnosis not present

## 2023-03-25 DIAGNOSIS — H9193 Unspecified hearing loss, bilateral: Secondary | ICD-10-CM | POA: Diagnosis not present

## 2023-03-25 DIAGNOSIS — H903 Sensorineural hearing loss, bilateral: Secondary | ICD-10-CM | POA: Diagnosis not present

## 2023-03-25 DIAGNOSIS — R42 Dizziness and giddiness: Secondary | ICD-10-CM | POA: Diagnosis not present

## 2023-05-02 DIAGNOSIS — Z961 Presence of intraocular lens: Secondary | ICD-10-CM | POA: Diagnosis not present

## 2023-05-17 DIAGNOSIS — J301 Allergic rhinitis due to pollen: Secondary | ICD-10-CM | POA: Diagnosis not present

## 2023-05-17 DIAGNOSIS — J4520 Mild intermittent asthma, uncomplicated: Secondary | ICD-10-CM | POA: Diagnosis not present

## 2023-05-17 DIAGNOSIS — J479 Bronchiectasis, uncomplicated: Secondary | ICD-10-CM | POA: Diagnosis not present

## 2023-05-17 DIAGNOSIS — R918 Other nonspecific abnormal finding of lung field: Secondary | ICD-10-CM | POA: Diagnosis not present

## 2023-05-21 ENCOUNTER — Other Ambulatory Visit: Payer: Self-pay | Admitting: Physician Assistant

## 2023-06-02 DIAGNOSIS — Z23 Encounter for immunization: Secondary | ICD-10-CM | POA: Diagnosis not present

## 2023-06-11 DIAGNOSIS — M4692 Unspecified inflammatory spondylopathy, cervical region: Secondary | ICD-10-CM | POA: Diagnosis not present

## 2023-06-25 ENCOUNTER — Encounter: Payer: Self-pay | Admitting: Family Medicine

## 2023-06-25 ENCOUNTER — Ambulatory Visit (INDEPENDENT_AMBULATORY_CARE_PROVIDER_SITE_OTHER): Payer: Medicare Other | Admitting: Family Medicine

## 2023-06-25 VITALS — BP 132/56 | HR 78 | Temp 97.8°F | Ht <= 58 in | Wt 95.0 lb

## 2023-06-25 DIAGNOSIS — I129 Hypertensive chronic kidney disease with stage 1 through stage 4 chronic kidney disease, or unspecified chronic kidney disease: Secondary | ICD-10-CM | POA: Diagnosis not present

## 2023-06-25 DIAGNOSIS — K2100 Gastro-esophageal reflux disease with esophagitis, without bleeding: Secondary | ICD-10-CM | POA: Diagnosis not present

## 2023-06-25 DIAGNOSIS — M810 Age-related osteoporosis without current pathological fracture: Secondary | ICD-10-CM | POA: Diagnosis not present

## 2023-06-25 DIAGNOSIS — J410 Simple chronic bronchitis: Secondary | ICD-10-CM | POA: Diagnosis not present

## 2023-06-25 DIAGNOSIS — E7849 Other hyperlipidemia: Secondary | ICD-10-CM

## 2023-06-25 DIAGNOSIS — R946 Abnormal results of thyroid function studies: Secondary | ICD-10-CM | POA: Diagnosis not present

## 2023-06-25 DIAGNOSIS — E039 Hypothyroidism, unspecified: Secondary | ICD-10-CM

## 2023-06-25 NOTE — Assessment & Plan Note (Signed)
Well controlled.  No changes to medicines. Continue norvasc 5 mg daily.  Continue to work on eating a healthy diet and exercise.  Labs drawn today.

## 2023-06-25 NOTE — Progress Notes (Unsigned)
Subjective:  Patient ID: Valerie Kelley, female    DOB: 03/12/1934  Age: 89 y.o. MRN: [id number]  Chief Complaint  Patient presents with   Medical Management of Chronic Issues    HPI Familial hyperlipidemia: otc fish oil 2 capsules every other day alternating with flax seed qod.   Neck pain/cervical stenosis. : patient sees Dr. Lorenso Courier with [facility]. Surgery  (C1-3 POSTERIOR SPINAL FUSION) performed 6.2023.  Patient has a stimulating device on her neck, but she is unsure what it is called Similar to a TENS unit. Patient sees Dr. Lorenso Courier annually.   HTN: quit Norvasc 5 mg daily because bp was low. Stopped 3 months ago. BP at home 98/58; 73. 124/55; 79. 102/52; 85. 138/53; 75. 143/62; 68. GERD: Prilosec 40 mg daily  Asthma/COPD:  Symbicort 2 puffs twice daily as needed. Sees Dr. Blenda Nicely.   On cymbalta 20 mg once at night for hot flashes.  On trazodone 590 mg 3 at night.   Hypothyroidism: Saw endocrinologist. Stopped biotin one week ago so it will not adversely affect thyroid levels. .      06/25/2023    8:36 AM 12/20/2022    8:48 AM 11/21/2022   10:23 AM 10/18/2022   10:09 AM 07/23/2022   10:12 AM  Depression screen PHQ 2/9  Decreased Interest 0 0 0 0 0  Down, Depressed, Hopeless 0 0 0 0 0  PHQ - 2 Score 0 0 0 0 0        06/25/2023    8:36 AM  Fall Risk   Falls in the past year? 1  Number falls in past yr: 0  Injury with Fall? 0  Risk for fall due to : No Fall Risks  Follow up Falls evaluation completed    Patient Care Team: Blane Ohara, MD as PCP - General (Family Medicine) Beverly Gust, MD as Referring Physician (Gastroenterology) Aurelio Jew., OD (Optometry) Sheran Luz, MD as Consulting Physician (Physical Medicine and Rehabilitation) Marcellus Scott, MD as Referring Physician (Specialist) Lisette Abu, MD as Referring Physician (Nephrology) Zettie Pho, [facility] (Inactive) (Pharmacist) Powers, Patrick North, MD as Referring Physician (Neurosurgery)    Review of Systems  Constitutional:  Positive for fatigue. Negative for chills and fever.  HENT:  Negative for congestion, ear pain, rhinorrhea and sore throat.   Respiratory:  Negative for cough and shortness of breath.   Cardiovascular:  Negative for chest pain.  Gastrointestinal:  Negative for abdominal pain, constipation, diarrhea, nausea and vomiting.  Genitourinary:  Negative for dysuria and urgency.  Musculoskeletal:  Positive for arthralgias and back pain. Negative for myalgias.  Neurological:  Negative for dizziness, weakness, light-headedness and headaches.  Psychiatric/Behavioral:  Negative for dysphoric mood. The patient is not nervous/anxious.     Current Outpatient Medications on File Prior to Visit  Medication Sig Dispense Refill   acetaminophen (TYLENOL) 650 MG CR tablet Take 1 tablet (650 mg total) by mouth 3 (three) times daily as needed. 270 tablet 0   albuterol (PROVENTIL HFA;VENTOLIN HFA) 108 (90 BASE) MCG/ACT inhaler Inhale 2 puffs into the lungs every 6 (six) hours as needed for wheezing. For wheezing     b complex vitamins capsule Take 1 capsule by mouth daily.     Cholecalciferol (VITAMIN D3) 125 MCG (5000 UT) TBDP Take 5,000 Units by mouth daily.     co-enzyme Q-10 30 MG capsule Take 30 mg by mouth daily.     DULoxetine (CYMBALTA) 20 MG capsule TAKE 1 CAPSULE BY MOUTH TWICE  DAILY 180 capsule 0   fish oil-omega-3 fatty acids 1000 MG capsule Take 2 capsules by mouth every other day. Patient using wild alaskan salmon product with 2400 mg     Flaxseed Oil OIL 1 capsule by Does not apply route every other day.     fluticasone (FLONASE) 50 MCG/ACT nasal spray Place 1 spray into both nostrils daily.     Glucosamine Sulfate 1000 MG CAPS Take 1 capsule by mouth daily. 500 mg tablets     Multiple Vitamins-Minerals (ALIVE WOMENS 50+) TABS Take 1 tablet by mouth daily at 12 noon.     omeprazole (PRILOSEC) 40 MG capsule Take 40 mg by mouth daily.     polyethylene glycol  (MIRALAX / GLYCOLAX) packet Take 51 g by mouth daily. Takes 3 capfuls daily     SYMBICORT 80-4.5 MCG/ACT inhaler Inhale 2 puffs into the lungs in the morning and at bedtime.     traZODone (DESYREL) 50 MG tablet TAKE 3 TABLETS BY MOUTH AT  BEDTIME 270 tablet 3   vitamin E 400 UNIT capsule Take 400 Units by mouth daily.     No current facility-administered medications on file prior to visit.   Past Medical History:  Diagnosis Date   Acquired hypothyroidism 02/24/2020   Allergic rhinitis    Anemia due to stage 3b chronic kidney disease (HCC) 05/22/2021   Arthritis    "all over" (04/15/2013)   Asthma    Chronic idiopathic constipation 02/24/2020   COPD (chronic obstructive pulmonary disease) (HCC)    DDD (degenerative disc disease), cervical 04/21/2018   Dyspnea    Exertional shortness of breath    Familial hyperlipidemia 02/24/2020   Gastroparesis 02/24/2020   GERD (gastroesophageal reflux disease)    H/O hiatal hernia    Hypertensive chronic kidney disease w stg 1-4/unsp chr kdny 08/24/2020   Lumbar radiculopathy 02/17/2021   Metabolic bone disease 01/03/2023   Moderate persistent asthma without complication 08/24/2020   Myalgia due to statin    Osteoarthritis of multiple joints 05/24/2016   Osteoporosis, post-menopausal 08/24/2020   Other fatigue 12/07/2021   Other headache syndrome 12/07/2021   Pain in joints 05/24/2016   Palpitations 07/21/2020   Paresthesia 12/07/2021   Spinal stenosis in cervical region 06/23/2018   Stage 3b chronic kidney disease (HCC) 12/23/2022   Stage III chronic kidney disease (HCC)    Past Surgical History:  Procedure Laterality Date   ABDOMINAL HYSTERECTOMY     CARPAL TUNNEL RELEASE Left 11/06/2021   CT release.   CERVICAL FUSION  04/21/2022   REVERSE SHOULDER ARTHROPLASTY Left 04/15/2013   REVERSE SHOULDER ARTHROPLASTY Left 04/15/2013   Procedure: REVERSE SHOULDER ARTHROPLASTY LEFT;  Surgeon: Verlee Rossetti, MD;  Location: [facility] OR;  Service:  Orthopedics;  Laterality: Left;   SINUS EXPLORATION     TONSILLECTOMY      Family History  Problem Relation Age of Onset   Stroke Mother    Cancer Brother        brain   Cancer Brother        leukemia   Cancer Brother        lung   Social History   Socioeconomic History   Marital status: Widowed    Spouse name: Not on file   Number of children: 2   Years of education: Not on file   Highest education level: Not on file  Occupational History   Occupation: Retired  Tobacco Use   Smoking status: Never   Smokeless tobacco: Never  Vaping Use   Vaping status: Never Used  Substance and Sexual Activity   Alcohol use: No   Drug use: No   Sexual activity: Not Currently  Other Topics Concern   Not on file  Social History Narrative   Not on file   Social Determinants of Health   Financial Resource Strain: Low Risk  (11/21/2022)   Overall Financial Resource Strain (CARDIA)    Difficulty of Paying Living Expenses: Not hard at all  Food Insecurity: No Food Insecurity (07/23/2022)   Hunger Vital Sign    Worried About Running Out of Food in the Last Year: Never true    Ran Out of Food in the Last Year: Never true  Transportation Needs: No Transportation Needs (11/21/2022)   PRAPARE - Administrator, Civil Service (Medical): No    Lack of Transportation (Non-Medical): No  Physical Activity: Inactive (11/21/2022)   Exercise Vital Sign    Days of Exercise per Week: 0 days    Minutes of Exercise per Session: 0 min  Stress: No Stress Concern Present (11/21/2022)   [employer] of Occupational Health - Occupational Stress Questionnaire    Feeling of Stress : Not at all  Social Connections: Socially Isolated (11/21/2022)   Social Connection and Isolation Panel [NHANES]    Frequency of Communication with Friends and Family: More than three times a week    Frequency of Social Gatherings with Friends and Family: More than three times a week    Attends Religious Services:  Never    Database administrator or Organizations: No    Attends Banker Meetings: Never    Marital Status: Widowed    Objective:  BP (!) 132/56   Pulse 78   Temp 97.8 F (36.6 C)   Ht 4\' 9"  (1.448 m)   Wt 95 lb (43.1 kg)   SpO2 98%   BMI 20.56 kg/m      06/25/2023    8:31 AM 12/20/2022    9:23 AM 12/20/2022    9:19 AM  BP/Weight  Systolic BP 132 135 188  Diastolic BP 56 59 60  Wt. (Lbs) 95    BMI 20.56 kg/m2      Physical Exam Vitals reviewed.  Constitutional:      Appearance: Normal appearance. She is normal weight.  Neck:     Vascular: No carotid bruit.  Cardiovascular:     Rate and Rhythm: Normal rate and regular rhythm.     Heart sounds: Normal heart sounds.  Pulmonary:     Effort: Pulmonary effort is normal. No respiratory distress.     Breath sounds: Normal breath sounds.  Abdominal:     General: Abdomen is flat. Bowel sounds are normal.     Palpations: Abdomen is soft.     Tenderness: There is no abdominal tenderness.  Neurological:     Mental Status: She is alert and oriented to person, place, and time.  Psychiatric:        Mood and Affect: Mood normal.        Behavior: Behavior normal.     Diabetic Foot Exam - Simple   No data filed      Lab Results  Component Value Date   WBC 6.2 06/25/2023   HGB 12.2 06/25/2023   HCT 39.0 06/25/2023   PLT 265 06/25/2023   GLUCOSE 82 06/25/2023   CHOL 292 (H) 06/25/2023   TRIG 236 (H) 06/25/2023   HDL 51 06/25/2023   LDLCALC 195 (  H) 06/25/2023   ALT 15 06/25/2023   AST 22 06/25/2023   NA 139 06/25/2023   K 5.2 06/25/2023   CL 104 06/25/2023   CREATININE 1.23 (H) 06/25/2023   BUN 20 06/25/2023   CO2 21 06/25/2023   TSH 5.730 (H) 06/25/2023      Assessment & Plan:    Simple chronic bronchitis (HCC) Assessment & Plan: Stable   Gastroesophageal reflux disease with esophagitis without hemorrhage Assessment & Plan: The current medical regimen is effective;  continue present plan  and medications.  Prilosec 40 mg daily   Acquired hypothyroidism Assessment & Plan: Check labs  Orders: -     TSH  Hypertensive chronic kidney disease w stg 1-4/unsp chr kdny Assessment & Plan: Well controlled.  No changes to medicines. Continue norvasc 5 mg daily.  Continue to work on eating a healthy diet and exercise.  Labs drawn today.    Orders: -     CBC with Differential/Platelet -     Comprehensive metabolic panel  Familial hyperlipidemia Assessment & Plan: Recommend continue to work on eating healthy diet and exercise. Check labs.   Orders: -     Lipid panel  Age-related osteoporosis without current pathological fracture -     VITAMIN D 25 Hydroxy (Vit-D Deficiency, Fractures) -     DG Bone Density; Future     No orders of the defined types were placed in this encounter.   Orders Placed This Encounter  Procedures   DG Bone Density   CBC with Differential/Platelet   Comprehensive metabolic panel   Lipid panel   TSH   VITAMIN D 25 Hydroxy (Vit-D Deficiency, Fractures)     Follow-up: Return in about 6 months (around 12/24/2023) for chronic follow up.   I,Marla I Leal-Borjas,acting as a scribe for Blane Ohara, MD.,have documented all relevant documentation on the behalf of Blane Ohara, MD,as directed by  Blane Ohara, MD while in the presence of Blane Ohara, MD.   An After Visit Summary was printed and given to the patient.  I attest that I have reviewed this visit and agree with the plan scribed by my staff.   Blane Ohara, MD [facility] [phone]

## 2023-06-25 NOTE — Assessment & Plan Note (Signed)
Check labs 

## 2023-06-25 NOTE — Assessment & Plan Note (Signed)
Recommend continue to work on eating healthy diet and exercise. Check labs 

## 2023-06-25 NOTE — Assessment & Plan Note (Signed)
Stable

## 2023-06-25 NOTE — Assessment & Plan Note (Signed)
The current medical regimen is effective;  continue present plan and medications.  Prilosec 40 mg daily

## 2023-06-26 LAB — VITAMIN D 25 HYDROXY (VIT D DEFICIENCY, FRACTURES): Vit D, 25-Hydroxy: 66.3 ng/mL (ref 30.0–100.0)

## 2023-06-26 LAB — CBC WITH DIFFERENTIAL/PLATELET
Basophils Absolute: 0 10*3/uL (ref 0.0–0.2)
Basos: 1 %
EOS (ABSOLUTE): 0.2 10*3/uL (ref 0.0–0.4)
Eos: 3 %
Hematocrit: 39 % (ref 34.0–46.6)
Hemoglobin: 12.2 g/dL (ref 11.1–15.9)
Immature Grans (Abs): 0 10*3/uL (ref 0.0–0.1)
Immature Granulocytes: 0 %
Lymphocytes Absolute: 2.3 10*3/uL (ref 0.7–3.1)
Lymphs: 37 %
MCH: 29.7 pg (ref 26.6–33.0)
MCHC: 31.3 g/dL — ABNORMAL LOW (ref 31.5–35.7)
MCV: 95 fL (ref 79–97)
Monocytes Absolute: 0.9 10*3/uL (ref 0.1–0.9)
Monocytes: 14 %
Neutrophils Absolute: 2.9 10*3/uL (ref 1.4–7.0)
Neutrophils: 45 %
Platelets: 265 10*3/uL (ref 150–450)
RBC: 4.11 x10E6/uL (ref 3.77–5.28)
RDW: 13.7 % (ref 11.7–15.4)
WBC: 6.2 10*3/uL (ref 3.4–10.8)

## 2023-06-26 LAB — COMPREHENSIVE METABOLIC PANEL
ALT: 15 [IU]/L (ref 0–32)
AST: 22 [IU]/L (ref 0–40)
Albumin: 4.4 g/dL (ref 3.7–4.7)
Alkaline Phosphatase: 134 [IU]/L — ABNORMAL HIGH (ref 44–121)
BUN/Creatinine Ratio: 16 (ref 12–28)
BUN: 20 mg/dL (ref 8–27)
Bilirubin Total: 0.4 mg/dL (ref 0.0–1.2)
CO2: 21 mmol/L (ref 20–29)
Calcium: 9.5 mg/dL (ref 8.7–10.3)
Chloride: 104 mmol/L (ref 96–106)
Creatinine, Ser: 1.23 mg/dL — ABNORMAL HIGH (ref 0.57–1.00)
Globulin, Total: 2.6 g/dL (ref 1.5–4.5)
Glucose: 82 mg/dL (ref 70–99)
Potassium: 5.2 mmol/L (ref 3.5–5.2)
Sodium: 139 mmol/L (ref 134–144)
Total Protein: 7 g/dL (ref 6.0–8.5)
eGFR: 43 mL/min/{1.73_m2} — ABNORMAL LOW (ref >59)

## 2023-06-26 LAB — LIPID PANEL
Chol/HDL Ratio: 5.7 {ratio} — ABNORMAL HIGH (ref 0.0–4.4)
Cholesterol, Total: 292 mg/dL — ABNORMAL HIGH (ref 100–199)
HDL: 51 mg/dL (ref >39)
LDL Chol Calc (NIH): 195 mg/dL — ABNORMAL HIGH (ref 0–99)
Triglycerides: 236 mg/dL — ABNORMAL HIGH (ref 0–149)
VLDL Cholesterol Cal: 46 mg/dL — ABNORMAL HIGH (ref 5–40)

## 2023-06-26 LAB — TSH: TSH: 5.73 u[IU]/mL — ABNORMAL HIGH (ref 0.450–4.500)

## 2023-06-27 ENCOUNTER — Other Ambulatory Visit: Payer: Self-pay | Admitting: Family Medicine

## 2023-06-27 DIAGNOSIS — E7849 Other hyperlipidemia: Secondary | ICD-10-CM

## 2023-06-27 MED ORDER — STUDY - EVOLVE-MI - EVOLOCUMAB (REPATHA) 140 MG/ML SQ INJECTION (PI-STUCKEY)
140.0000 mg | INJECTION | SUBCUTANEOUS | Status: DC
Start: 2023-06-27 — End: 2023-12-11

## 2023-06-27 NOTE — Progress Notes (Signed)
Patient made aware of results, verbalized understanding. Medications sent to pharmacy.

## 2023-07-04 LAB — SPECIMEN STATUS REPORT

## 2023-07-04 LAB — T4, FREE: Free T4: 1.09 ng/dL (ref 0.82–1.77)

## 2023-07-30 ENCOUNTER — Other Ambulatory Visit: Payer: Self-pay | Admitting: Physician Assistant

## 2023-08-07 DIAGNOSIS — R918 Other nonspecific abnormal finding of lung field: Secondary | ICD-10-CM | POA: Diagnosis not present

## 2023-08-07 DIAGNOSIS — J301 Allergic rhinitis due to pollen: Secondary | ICD-10-CM | POA: Diagnosis not present

## 2023-08-07 DIAGNOSIS — J4520 Mild intermittent asthma, uncomplicated: Secondary | ICD-10-CM | POA: Diagnosis not present

## 2023-08-07 DIAGNOSIS — J479 Bronchiectasis, uncomplicated: Secondary | ICD-10-CM | POA: Diagnosis not present

## 2023-10-08 DIAGNOSIS — K5904 Chronic idiopathic constipation: Secondary | ICD-10-CM | POA: Diagnosis not present

## 2023-10-08 DIAGNOSIS — K219 Gastro-esophageal reflux disease without esophagitis: Secondary | ICD-10-CM | POA: Diagnosis not present

## 2023-12-09 NOTE — Progress Notes (Signed)
 Subjective:  Patient ID: Valerie Kelley, female    DOB: 1934-05-16  Age: 89 y.o. MRN: [id number]  Chief Complaint  Patient presents with   Medical Management of Chronic Issues    Discussed the use of AI scribe software for clinical note transcription with the patient, who gave verbal consent to proceed.  History of Present Illness   Valerie Kelley is an 89 year old female who presents with hair loss.  She experiences diffuse thinning of hair across the scalp. The hair loss began prior to starting biotin supplementation. She is concerned about her underactive thyroid contributing to the hair loss, but she is not on thyroid medication due to previous adverse effects, including increased muscle and bone pain. She has refuses levothyroxine or any thyroid medicines.   Her current medications include amlodipine for hypertension, omeprazole for gastroesophageal reflux disease, trazodone for insomnia, Singulair for asthma, and Cymbalta for mood stabilization and hot flashes. She alternates between fish oil and flaxseed oil daily and takes a tablespoon of lecithin in the evenings for cholesterol management. She avoids cholesterol medications due to past experiences of muscle pain and allergic reactions. Singulair effectively manages her asthma, and Cymbalta aids in mood stabilization, depression, and anxiety.  No fevers, chills, sweats, earache, sore throat, stuffy nose, chest pain, abdominal pain, bowel problems, bladder issues, nausea, vomiting, heartburn, burning with urination, frequent urination, dizziness, headaches, anxiety, or depression. She reports joint pain and constipation, for which she takes Miralax daily.      Acquired hypothyroidism:not on levothyroxine. She does not like how it makes her feel.   Familial hyperlipidemia: otc fish oil 2 capsules every other day alternating with flax seed qod. 1 tbsp of lecethin oil.   Neck pain/cervical stenosis. : patient sees Dr. Lorenso Courier with  [facility]. Surgery  (C1-3 POSTERIOR SPINAL FUSION) performed 6.2023.   Larey Seat 11/21/2022. Went to ED. Hit her head. Did numerous scans. No fractures. Unsure what caused her to fall.   HTN: Norvasc 5 mg daily. BP at home 122-135/ 58-68. Pulse 65-79s.   GERD: Prilosec 40 mg daily  Insomnia: Trazodone 50 mg 3 oral qhs.  Asthma/COPD: low dose symbicort 2 puffs as needed rather than scheduled. On singulair 10 mg daily. Sees Dr. Blenda Nicely.   On cymbalta 20 mg once at night for hot flashes.       12/14/2023   10:10 PM 06/25/2023    8:36 AM 12/20/2022    8:48 AM 11/21/2022   10:23 AM 10/18/2022   10:09 AM  Depression screen PHQ 2/9  Decreased Interest 0 0 0 0 0  Down, Depressed, Hopeless 0 0 0 0 0  PHQ - 2 Score 0 0 0 0 0        12/14/2023   10:11 PM  Fall Risk   Falls in the past year? 1  Number falls in past yr: 1  Injury with Fall? 1  Risk for fall due to : History of fall(s);Impaired balance/gait  Follow up Falls evaluation completed;Falls prevention discussed    Patient Care Team: Blane Ohara, MD as PCP - General (Family Medicine) Beverly Gust, MD as Referring Physician (Gastroenterology) Aurelio Jew., OD (Optometry) Sheran Luz, MD as Consulting Physician (Physical Medicine and Rehabilitation) Marcellus Scott, MD as Referring Physician (Specialist) Lisette Abu, MD as Referring Physician (Nephrology) Zettie Pho, [facility] (Inactive) (Pharmacist) Powers, Patrick North, MD as Referring Physician (Neurosurgery)   Review of Systems  Constitutional:  Negative for chills, fatigue and fever.  HENT:  Negative  for congestion, ear pain and sore throat.   Respiratory:  Negative for cough and shortness of breath.   Cardiovascular:  Negative for chest pain and palpitations.  Gastrointestinal:  Positive for constipation (miralax daily helps.). Negative for abdominal pain, diarrhea, nausea and vomiting.  Endocrine: Negative for polyuria.  Genitourinary:  Negative for difficulty  urinating and dysuria.  Musculoskeletal:  Positive for arthralgias. Negative for back pain and myalgias.  Skin:  Negative for rash.  Neurological:  Negative for dizziness and headaches.  Psychiatric/Behavioral:  Negative for dysphoric mood. The patient is not nervous/anxious.     Current Outpatient Medications on File Prior to Visit  Medication Sig Dispense Refill   acetaminophen (TYLENOL) 650 MG CR tablet Take 1 tablet (650 mg total) by mouth 3 (three) times daily as needed. 270 tablet 0   albuterol (PROVENTIL HFA;VENTOLIN HFA) 108 (90 BASE) MCG/ACT inhaler Inhale 2 puffs into the lungs every 6 (six) hours as needed for wheezing. For wheezing     amLODipine (NORVASC) 5 MG tablet Take 5 mg by mouth daily.     b complex vitamins capsule Take 1 capsule by mouth daily.     Cholecalciferol (VITAMIN D3) 125 MCG (5000 UT) TBDP Take 5,000 Units by mouth daily.     co-enzyme Q-10 30 MG capsule Take 30 mg by mouth daily.     DULoxetine (CYMBALTA) 20 MG capsule TAKE 1 CAPSULE BY MOUTH TWICE  DAILY 180 capsule 1   fish oil-omega-3 fatty acids 1000 MG capsule Take 2 capsules by mouth every other day. Patient using wild [place]an salmon product with 2400 mg     Flaxseed Oil OIL 1 capsule by Does not apply route every other day.     fluticasone (FLONASE) 50 MCG/ACT nasal spray Place 1 spray into both nostrils daily.     Glucosamine Sulfate 1000 MG CAPS Take 1 capsule by mouth daily. 500 mg tablets     montelukast (SINGULAIR) 10 MG tablet Take 10 mg by mouth daily.     Multiple Vitamins-Minerals (ALIVE WOMENS 50+) TABS Take 1 tablet by mouth daily at 12 noon.     omeprazole (PRILOSEC) 40 MG capsule Take 40 mg by mouth daily.     polyethylene glycol (MIRALAX / GLYCOLAX) packet Take 51 g by mouth daily. Takes 3 capfuls daily     SYMBICORT 80-4.5 MCG/ACT inhaler Inhale 2 puffs into the lungs in the morning and at bedtime.     traZODone (DESYREL) 50 MG tablet TAKE 3 TABLETS BY MOUTH AT  BEDTIME 270 tablet 3    vitamin E 400 UNIT capsule Take 400 Units by mouth daily.     No current facility-administered medications on file prior to visit.   Past Medical History:  Diagnosis Date   Acquired hypothyroidism 02/24/2020   Allergic rhinitis    Anemia due to stage 3b chronic kidney disease (HCC) 05/22/2021   Arthritis    "all over" (04/15/2013)   Asthma    Chronic idiopathic constipation 02/24/2020   COPD (chronic obstructive pulmonary disease) (HCC)    DDD (degenerative disc disease), cervical 04/21/2018   Dyspnea    Exertional shortness of breath    Familial hyperlipidemia 02/24/2020   Gastroparesis 02/24/2020   GERD (gastroesophageal reflux disease)    H/O hiatal hernia    Hypertensive chronic kidney disease w stg 1-4/unsp chr kdny 08/24/2020   Lumbar radiculopathy 02/17/2021   Metabolic bone disease 01/03/2023   Moderate persistent asthma without complication 08/24/2020   Myalgia due to statin  Osteoarthritis of multiple joints 05/24/2016   Osteoporosis, post-menopausal 08/24/2020   Other fatigue 12/07/2021   Other headache syndrome 12/07/2021   Pain in joints 05/24/2016   Palpitations 07/21/2020   Paresthesia 12/07/2021   Spinal stenosis in cervical region 06/23/2018   Stage 3b chronic kidney disease (HCC) 12/23/2022   Stage III chronic kidney disease [facility])    Past Surgical History:  Procedure Laterality Date   ABDOMINAL HYSTERECTOMY     CARPAL TUNNEL RELEASE Left 11/06/2021   CT release.   CERVICAL FUSION  04/21/2022   REVERSE SHOULDER ARTHROPLASTY Left 04/15/2013   REVERSE SHOULDER ARTHROPLASTY Left 04/15/2013   Procedure: REVERSE SHOULDER ARTHROPLASTY LEFT;  Surgeon: Verlee Rossetti, MD;  Location: [facility] OR;  Service: Orthopedics;  Laterality: Left;   SINUS EXPLORATION     TONSILLECTOMY      Family History  Problem Relation Age of Onset   Stroke Mother    Cancer Brother        brain   Cancer Brother        leukemia   Cancer Brother        lung   Social History    Socioeconomic History   Marital status: Widowed    Spouse name: Not on file   Number of children: 2   Years of education: Not on file   Highest education level: Not on file  Occupational History   Occupation: Retired  Tobacco Use   Smoking status: Never   Smokeless tobacco: Never  Vaping Use   Vaping status: Never Used  Substance and Sexual Activity   Alcohol use: No   Drug use: No   Sexual activity: Not Currently  Other Topics Concern   Not on file  Social History Narrative   Not on file   Social Drivers of Health   Financial Resource Strain: Low Risk  (11/21/2022)   Overall Financial Resource Strain (CARDIA)    Difficulty of Paying Living Expenses: Not hard at all  Food Insecurity: No Food Insecurity (07/23/2022)   Hunger Vital Sign    Worried About Running Out of Food in the Last Year: Never true    Ran Out of Food in the Last Year: Never true  Transportation Needs: No Transportation Needs (11/21/2022)   PRAPARE - Administrator, Civil Service (Medical): No    Lack of Transportation (Non-Medical): No  Physical Activity: Inactive (11/21/2022)   Exercise Vital Sign    Days of Exercise per Week: 0 days    Minutes of Exercise per Session: 0 min  Stress: No Stress Concern Present (11/21/2022)   [employer] of Occupational Health - Occupational Stress Questionnaire    Feeling of Stress : Not at all  Social Connections: Socially Isolated (11/21/2022)   Social Connection and Isolation Panel [NHANES]    Frequency of Communication with Friends and Family: More than three times a week    Frequency of Social Gatherings with Friends and Family: More than three times a week    Attends Religious Services: Never    Database administrator or Organizations: No    Attends Banker Meetings: Never    Marital Status: Widowed    Objective:  BP (!) 160/64   Pulse 62   Temp (!) 97.1 F (36.2 C)   Resp 14   Ht 4\' 9"  (1.448 m)   Wt 94 lb (42.6 kg)    SpO2 99%   BMI 20.34 kg/m      12/10/2023  9:04 AM 12/10/2023    8:28 AM 06/25/2023    8:31 AM  BP/Weight  Systolic BP 160 144 132  Diastolic BP 64 60 56  Wt. (Lbs)  94 95  BMI  20.34 kg/m2 20.56 kg/m2    Physical Exam Vitals reviewed.  Constitutional:      Appearance: Normal appearance. She is normal weight.  Neck:     Vascular: No carotid bruit.  Cardiovascular:     Rate and Rhythm: Normal rate and regular rhythm.     Heart sounds: Normal heart sounds.  Pulmonary:     Effort: Pulmonary effort is normal. No respiratory distress.     Breath sounds: Normal breath sounds.  Abdominal:     General: Abdomen is flat. Bowel sounds are normal.     Palpations: Abdomen is soft.     Tenderness: There is no abdominal tenderness.  Skin:    Comments: Thinning hair.   Neurological:     Mental Status: She is alert and oriented to person, place, and time.  Psychiatric:        Mood and Affect: Mood normal.        Behavior: Behavior normal.     Diabetic Foot Exam - Simple   No data filed      Lab Results  Component Value Date   WBC 6.3 12/10/2023   HGB 12.5 12/10/2023   HCT 37.1 12/10/2023   PLT 246 12/10/2023   GLUCOSE 85 12/10/2023   CHOL 244 (H) 12/10/2023   TRIG 148 12/10/2023   HDL 63 12/10/2023   LDLCALC 155 (H) 12/10/2023   ALT 20 12/10/2023   AST 23 12/10/2023   NA 139 12/10/2023   K 4.4 12/10/2023   CL 103 12/10/2023   CREATININE 1.24 (H) 12/10/2023   BUN 23 12/10/2023   CO2 19 (L) 12/10/2023   TSH 4.400 12/10/2023      Assessment & Plan:  Assessment and Plan    Hair loss Experiencing diffuse thinning of hair, likely related to hypothyroidism. Not currently on thyroid medication due to previous adverse effects, including muscle and bone pain. Discussed finasteride as a potential treatment, noting it takes several months to see results. She is willing to try finasteride. - Order blood work to assess thyroid function and other potential causes of hair  loss. - Prescribe finasteride for hair loss and send prescription to [facility].  Hypertension Blood pressure was elevated in the office, often high on the first reading. On amlodipine for management and reports home readings are generally well-controlled. Plan to recheck blood pressure to confirm control. - Recheck blood pressure in the office. - Schedule a follow-up blood pressure check with home blood pressure cuff in approximately one week.  Asthma Currently using Singulair (montelukast) as a maintenance medication. Uses an inhaler as needed and reports the regimen is effective.  Gastroesophageal reflux disease (GERD) On omeprazole for management. No current complaints of heartburn or related symptoms.  Insomnia On trazodone for sleep for several years, prescribed by a previous physician. The medication is effective for sleep management.       Gastroesophageal reflux disease with esophagitis without hemorrhage Assessment & Plan: On omeprazole for management. No current complaints of heartburn or related symptoms.   Chronic idiopathic constipation Assessment & Plan: Experiences constipation and manages it with daily Miralax, which is effective.   Acquired hypothyroidism Assessment & Plan: Previously well controlled Continue Synthroid at current dose  Recheck TSH and adjust Synthroid as indicated    Orders: -  TSH  Hypertensive chronic kidney disease w stg 1-4/unsp chr kdny Assessment & Plan: Blood pressure was elevated in the office, often high on the first reading. On amlodipine for management and reports home readings are generally well-controlled.  BP has been compared on her cuff with bp at her specialists. It is accurate. BP 120/80s.  Orders: -     CBC with Differential/Platelet -     Comprehensive metabolic panel  Familial hyperlipidemia Assessment & Plan: Recommend continue to work on eating healthy diet and exercise. Check labs.   Orders: -     Lipid  panel  Primary insomnia Assessment & Plan: On trazodone for sleep for several years, prescribed by a previous physician. The medication is effective for sleep management.   Telogen hair loss Assessment & Plan: Experiencing diffuse thinning of hair, likely related to hypothyroidism. Not currently on thyroid medication due to previous adverse effects, including muscle and bone pain. Discussed finasteride as a potential treatment, noting it takes several months to see results. She is willing to try finasteride. - Order blood work to assess thyroid function and other potential causes of hair loss. - Prescribe finasteride for hair loss and send prescription to [facility].  Orders: -     Finasteride; Take 1 tablet (5 mg total) by mouth daily.  Dispense: 90 tablet; Refill: 1  Moderate persistent asthma without complication Assessment & Plan: Currently using Singulair (montelukast) as a maintenance medication. Uses an inhaler as needed and reports the regimen is effective.   CKD stage 3b, GFR 30-44 ml/min (HCC) Assessment & Plan: Stable.      Meds ordered this encounter  Medications   finasteride (PROSCAR) 5 MG tablet    Sig: Take 1 tablet (5 mg total) by mouth daily.    Dispense:  90 tablet    Refill:  1    Orders Placed This Encounter  Procedures   CBC with Differential/Platelet   Comprehensive metabolic panel   Lipid panel   TSH     Follow-up: Return for awv in March with Kim on the phone. chr follow up in 3 months fasting.Clayborn Bigness I Leal-Borjas,acting as a scribe for Blane Ohara, MD.,have documented all relevant documentation on the behalf of Blane Ohara, MD,as directed by  Blane Ohara, MD while in the presence of Blane Ohara, MD.   An After Visit Summary was printed and given to the patient.  I attest that I have reviewed this visit and agree with the plan scribed by my staff.   Blane Ohara, MD [facility] [phone]

## 2023-12-10 ENCOUNTER — Encounter: Payer: Self-pay | Admitting: Family Medicine

## 2023-12-10 ENCOUNTER — Other Ambulatory Visit: Payer: Self-pay | Admitting: Family Medicine

## 2023-12-10 ENCOUNTER — Ambulatory Visit (INDEPENDENT_AMBULATORY_CARE_PROVIDER_SITE_OTHER): Payer: Medicare Other | Admitting: Family Medicine

## 2023-12-10 VITALS — BP 160/64 | HR 62 | Temp 97.1°F | Resp 14 | Ht <= 58 in | Wt 94.0 lb

## 2023-12-10 DIAGNOSIS — K2100 Gastro-esophageal reflux disease with esophagitis, without bleeding: Secondary | ICD-10-CM

## 2023-12-10 DIAGNOSIS — K5904 Chronic idiopathic constipation: Secondary | ICD-10-CM

## 2023-12-10 DIAGNOSIS — E7849 Other hyperlipidemia: Secondary | ICD-10-CM | POA: Diagnosis not present

## 2023-12-10 DIAGNOSIS — I10 Essential (primary) hypertension: Secondary | ICD-10-CM

## 2023-12-10 DIAGNOSIS — F5101 Primary insomnia: Secondary | ICD-10-CM | POA: Diagnosis not present

## 2023-12-10 DIAGNOSIS — E039 Hypothyroidism, unspecified: Secondary | ICD-10-CM

## 2023-12-10 DIAGNOSIS — J4540 Moderate persistent asthma, uncomplicated: Secondary | ICD-10-CM | POA: Diagnosis not present

## 2023-12-10 DIAGNOSIS — N1832 Chronic kidney disease, stage 3b: Secondary | ICD-10-CM

## 2023-12-10 DIAGNOSIS — I129 Hypertensive chronic kidney disease with stage 1 through stage 4 chronic kidney disease, or unspecified chronic kidney disease: Secondary | ICD-10-CM

## 2023-12-10 DIAGNOSIS — L650 Telogen effluvium: Secondary | ICD-10-CM

## 2023-12-10 MED ORDER — FINASTERIDE 5 MG PO TABS: 5.0000 mg | ORAL_TABLET | Freq: Every day | ORAL | 1 refills | Status: DC

## 2023-12-10 NOTE — Patient Instructions (Signed)
 Start on finasteride 5 mg daily for hair loss.   Consider repatha shots every 2 weeks.

## 2023-12-11 ENCOUNTER — Telehealth: Payer: Self-pay

## 2023-12-11 ENCOUNTER — Other Ambulatory Visit: Payer: Self-pay

## 2023-12-11 LAB — CBC WITH DIFFERENTIAL/PLATELET
Basophils Absolute: 0 10*3/uL (ref 0.0–0.2)
Basos: 0 %
EOS (ABSOLUTE): 0.1 10*3/uL (ref 0.0–0.4)
Eos: 1 %
Hematocrit: 37.1 % (ref 34.0–46.6)
Hemoglobin: 12.5 g/dL (ref 11.1–15.9)
Immature Grans (Abs): 0 10*3/uL (ref 0.0–0.1)
Immature Granulocytes: 0 %
Lymphocytes Absolute: 2.4 10*3/uL (ref 0.7–3.1)
Lymphs: 38 %
MCH: 30.7 pg (ref 26.6–33.0)
MCHC: 33.7 g/dL (ref 31.5–35.7)
MCV: 91 fL (ref 79–97)
Monocytes Absolute: 0.9 10*3/uL (ref 0.1–0.9)
Monocytes: 14 %
Neutrophils Absolute: 2.9 10*3/uL (ref 1.4–7.0)
Neutrophils: 47 %
Platelets: 246 10*3/uL (ref 150–450)
RBC: 4.07 x10E6/uL (ref 3.77–5.28)
RDW: 12.8 % (ref 11.7–15.4)
WBC: 6.3 10*3/uL (ref 3.4–10.8)

## 2023-12-11 LAB — COMPREHENSIVE METABOLIC PANEL
ALT: 20 IU/L (ref 0–32)
AST: 23 IU/L (ref 0–40)
Albumin: 4.5 g/dL (ref 3.7–4.7)
Alkaline Phosphatase: 144 IU/L — ABNORMAL HIGH (ref 44–121)
BUN/Creatinine Ratio: 19 (ref 12–28)
BUN: 23 mg/dL (ref 8–27)
Bilirubin Total: 0.4 mg/dL (ref 0.0–1.2)
CO2: 19 mmol/L — ABNORMAL LOW (ref 20–29)
Calcium: 9.5 mg/dL (ref 8.7–10.3)
Chloride: 103 mmol/L (ref 96–106)
Creatinine, Ser: 1.24 mg/dL — ABNORMAL HIGH (ref 0.57–1.00)
Globulin, Total: 2.7 g/dL (ref 1.5–4.5)
Glucose: 85 mg/dL (ref 70–99)
Potassium: 4.4 mmol/L (ref 3.5–5.2)
Sodium: 139 mmol/L (ref 134–144)
Total Protein: 7.2 g/dL (ref 6.0–8.5)
eGFR: 42 mL/min/{1.73_m2} — ABNORMAL LOW (ref >59)

## 2023-12-11 LAB — LIPID PANEL
Chol/HDL Ratio: 3.9 ratio (ref 0.0–4.4)
Cholesterol, Total: 244 mg/dL — ABNORMAL HIGH (ref 100–199)
HDL: 63 mg/dL (ref >39)
LDL Chol Calc (NIH): 155 mg/dL — ABNORMAL HIGH (ref 0–99)
Triglycerides: 148 mg/dL (ref 0–149)
VLDL Cholesterol Cal: 26 mg/dL (ref 5–40)

## 2023-12-11 LAB — TSH: TSH: 4.4 u[IU]/mL (ref 0.450–4.500)

## 2023-12-11 MED ORDER — REPATHA SURECLICK 140 MG/ML ~~LOC~~ SOAJ: 140.0000 mg | SUBCUTANEOUS | 2 refills | Status: DC

## 2023-12-11 NOTE — Telephone Encounter (Signed)
 Patient agreed to take repatha, ok to send to pharmacy per Dr. Sedalia Muta, Rx sent.  Copied from CRM [id number]. Topic: Clinical - Prescription Issue >> Dec 10, 2023 11:57 AM Fuller Mandril wrote: Reason for CRM: Patient called states she was seen today and provider wanted to start her on Repatha. Patient states she has an answer for provider but has a few questions first. Would like for Dr Sedalia Muta or Nurse to giver her a call back if possible. Thank You

## 2023-12-14 DIAGNOSIS — L650 Telogen effluvium: Secondary | ICD-10-CM | POA: Insufficient documentation

## 2023-12-14 DIAGNOSIS — F5101 Primary insomnia: Secondary | ICD-10-CM | POA: Insufficient documentation

## 2023-12-14 NOTE — Assessment & Plan Note (Signed)
 On trazodone for sleep for several years, prescribed by a previous physician. The medication is effective for sleep management.

## 2023-12-14 NOTE — Assessment & Plan Note (Signed)
>>  ASSESSMENT AND PLAN FOR CKD STAGE 3B, GFR 30-44 ML/MIN (HCC) WRITTEN ON 12/14/2023 10:16 PM BY COX, KIRSTEN, MD  Stable.

## 2023-12-14 NOTE — Assessment & Plan Note (Signed)
 Experiences constipation and manages it with daily Miralax, which is effective.

## 2023-12-14 NOTE — Assessment & Plan Note (Signed)
Recommend continue to work on eating healthy diet and exercise. Check labs 

## 2023-12-14 NOTE — Assessment & Plan Note (Addendum)
 Blood pressure was elevated in the office, often high on the first reading. On amlodipine for management and reports home readings are generally well-controlled.  BP has been compared on her cuff with bp at her specialists. It is accurate. BP 120/80s.

## 2023-12-14 NOTE — Assessment & Plan Note (Signed)
 Experiencing diffuse thinning of hair, likely related to hypothyroidism. Not currently on thyroid medication due to previous adverse effects, including muscle and bone pain. Discussed finasteride as a potential treatment, noting it takes several months to see results. She is willing to try finasteride. - Order blood work to assess thyroid function and other potential causes of hair loss. - Prescribe finasteride for hair loss and send prescription to [facility].

## 2023-12-14 NOTE — Assessment & Plan Note (Signed)
 Currently using Singulair (montelukast) as a maintenance medication. Uses an inhaler as needed and reports the regimen is effective.

## 2023-12-14 NOTE — Assessment & Plan Note (Signed)
 Stable

## 2023-12-14 NOTE — Assessment & Plan Note (Signed)
 On omeprazole for management. No current complaints of heartburn or related symptoms.

## 2023-12-14 NOTE — Assessment & Plan Note (Addendum)
 Previously well controlled Continue Synthroid at current dose  Recheck TSH and adjust Synthroid as indicated

## 2023-12-17 ENCOUNTER — Ambulatory Visit

## 2023-12-17 VITALS — BP 124/63 | HR 69 | Ht <= 58 in | Wt 94.0 lb

## 2023-12-17 DIAGNOSIS — Z0000 Encounter for general adult medical examination without abnormal findings: Secondary | ICD-10-CM

## 2023-12-17 NOTE — Patient Instructions (Signed)
 Valerie Kelley , Thank you for taking time to come for your Medicare Wellness Visit. I appreciate your ongoing commitment to your health goals. Please review the following plan we discussed and let me know if I can assist you in the future.    This is a list of the screening recommended for you and due dates:  Health Maintenance  Topic Date Due   COVID-19 Vaccine (7 - Pfizer risk 2024-25 season) 12/07/2023   Medicare Annual Wellness Visit  12/16/2024   DTaP/Tdap/Td vaccine (5 - Td or Tdap) 06/13/2033   Pneumonia Vaccine  Completed   Flu Shot  Completed   DEXA scan (bone density measurement)  Completed   Zoster (Shingles) Vaccine  Completed   HPV Vaccine  Aged Out    Preventive Care 50 Years and Older, Female Preventive care refers to lifestyle choices and visits with your health care provider that can promote health and wellness. What does preventive care include? A yearly physical exam. This is also called an annual well check. Dental exams once or twice a year. Routine eye exams. Ask your health care provider how often you should have your eyes checked. Personal lifestyle choices, including: Daily care of your teeth and gums. Regular physical activity. Eating a healthy diet. Avoiding tobacco and drug use. Limiting alcohol use. Practicing safe sex. Taking low-dose aspirin every day. Taking vitamin and mineral supplements as recommended by your health care provider. What happens during an annual well check? The services and screenings done by your health care provider during your annual well check will depend on your age, overall health, lifestyle risk factors, and family history of disease. Counseling  Your health care provider may ask you questions about your: Alcohol use. Tobacco use. Drug use. Emotional well-being. Home and relationship well-being. Sexual activity. Eating habits. History of falls. Memory and ability to understand (cognition). Work and work  Astronomer. Reproductive health. Screening  You may have the following tests or measurements: Height, weight, and BMI. Blood pressure. Lipid and cholesterol levels. These may be checked every 5 years, or more frequently if you are over 68 years old. Skin check. Lung cancer screening. You may have this screening every year starting at age 7 if you have a 30-pack-year history of smoking and currently smoke or have quit within the past 15 years. Fecal occult blood test (FOBT) of the stool. You may have this test every year starting at age 48. Flexible sigmoidoscopy or colonoscopy. You may have a sigmoidoscopy every 5 years or a colonoscopy every 10 years starting at age 52. Hepatitis C blood test. Hepatitis B blood test. Sexually transmitted disease (STD) testing. Diabetes screening. This is done by checking your blood sugar (glucose) after you have not eaten for a while (fasting). You may have this done every 1-3 years. Bone density scan. This is done to screen for osteoporosis. You may have this done starting at age 23. Mammogram. This may be done every 1-2 years. Talk to your health care provider about how often you should have regular mammograms. Talk with your health care provider about your test results, treatment options, and if necessary, the need for more tests. Vaccines  Your health care provider may recommend certain vaccines, such as: Influenza vaccine. This is recommended every year. Tetanus, diphtheria, and acellular pertussis (Tdap, Td) vaccine. You may need a Td booster every 10 years. Zoster vaccine. You may need this after age 75. Pneumococcal 13-valent conjugate (PCV13) vaccine. One dose is recommended after age 21. Pneumococcal polysaccharide (  PPSV23) vaccine. One dose is recommended after age 31. Talk to your health care provider about which screenings and vaccines you need and how often you need them. This information is not intended to replace advice given to you by  your health care provider. Make sure you discuss any questions you have with your health care provider. Document Released: 10/07/2015 Document Revised: 05/30/2016 Document Reviewed: 07/12/2015 Elsevier Interactive Patient Education  2017 [employer].  Fall Prevention in the Home Falls can cause injuries. They can happen to people of all ages. There are many things you can do to make your home safe and to help prevent falls. What can I do on the outside of my home? Regularly fix the edges of walkways and driveways and fix any cracks. Remove anything that might make you trip as you walk through a door, such as a raised step or threshold. Trim any bushes or trees on the path to your home. Use bright outdoor lighting. Clear any walking paths of anything that might make someone trip, such as rocks or tools. Regularly check to see if handrails are loose or broken. Make sure that both sides of any steps have handrails. Any raised decks and porches should have guardrails on the edges. Have any leaves, snow, or ice cleared regularly. Use sand or salt on walking paths during winter. Clean up any spills in your garage right away. This includes oil or grease spills. What can I do in the bathroom? Use night lights. Install grab bars by the toilet and in the tub and shower. Do not use towel bars as grab bars. Use non-skid mats or decals in the tub or shower. If you need to sit down in the shower, use a plastic, non-slip stool. Keep the floor dry. Clean up any water that spills on the floor as soon as it happens. Remove soap buildup in the tub or shower regularly. Attach bath mats securely with double-sided non-slip rug tape. Do not have throw rugs and other things on the floor that can make you trip. What can I do in the bedroom? Use night lights. Make sure that you have a light by your bed that is easy to reach. Do not use any sheets or blankets that are too big for your bed. They should not hang  down onto the floor. Have a firm chair that has side arms. You can use this for support while you get dressed. Do not have throw rugs and other things on the floor that can make you trip. What can I do in the kitchen? Clean up any spills right away. Avoid walking on wet floors. Keep items that you use a lot in easy-to-reach places. If you need to reach something above you, use a strong step stool that has a grab bar. Keep electrical cords out of the way. Do not use floor polish or wax that makes floors slippery. If you must use wax, use non-skid floor wax. Do not have throw rugs and other things on the floor that can make you trip. What can I do with my stairs? Do not leave any items on the stairs. Make sure that there are handrails on both sides of the stairs and use them. Fix handrails that are broken or loose. Make sure that handrails are as long as the stairways. Check any carpeting to make sure that it is firmly attached to the stairs. Fix any carpet that is loose or worn. Avoid having throw rugs at the top or  bottom of the stairs. If you do have throw rugs, attach them to the floor with carpet tape. Make sure that you have a light switch at the top of the stairs and the bottom of the stairs. If you do not have them, ask someone to add them for you. What else can I do to help prevent falls? Wear shoes that: Do not have high heels. Have rubber bottoms. Are comfortable and fit you well. Are closed at the toe. Do not wear sandals. If you use a stepladder: Make sure that it is fully opened. Do not climb a closed stepladder. Make sure that both sides of the stepladder are locked into place. Ask someone to hold it for you, if possible. Clearly mark and make sure that you can see: Any grab bars or handrails. First and last steps. Where the edge of each step is. Use tools that help you move around (mobility aids) if they are needed. These  include: Canes. Walkers. Scooters. Crutches. Turn on the lights when you go into a dark area. Replace any light bulbs as soon as they burn out. Set up your furniture so you have a clear path. Avoid moving your furniture around. If any of your floors are uneven, fix them. If there are any pets around you, be aware of where they are. Review your medicines with your doctor. Some medicines can make you feel dizzy. This can increase your chance of falling. Ask your doctor what other things that you can do to help prevent falls. This information is not intended to replace advice given to you by your health care provider. Make sure you discuss any questions you have with your health care provider. Document Released: 07/07/2009 Document Revised: 02/16/2016 Document Reviewed: 10/15/2014 Elsevier Interactive Patient Education  2017 [employer].

## 2023-12-17 NOTE — Progress Notes (Signed)
 Subjective:   Valerie Kelley is a 89 y.o. female who presents for Medicare Annual (Subsequent) preventive examination.  This wellness visit is conducted by a nurse.  The patient's medications were reviewed and reconciled since the patient's last visit.  History details were provided by the patient.  The history appears to be reliable.    Medical History: Patient history and Family history was reviewed  Medications, Allergies, and preventative health maintenance was reviewed and updated.   Visit Complete: Virtual I connected with  Gaetano Net on 12/17/23 by a audio enabled telemedicine application and verified that I am speaking with the correct person using two identifiers.  Patient Location: Home  Provider Location: Office/Clinic  I discussed the limitations of evaluation and management by telemedicine. The patient expressed understanding and agreed to proceed.  Vital Signs: Because this visit was a virtual/telehealth visit, some criteria may be missing or patient reported. Any vitals not documented were not able to be obtained and vitals that have been documented are patient reported.  Cardiac Risk Factors include: advanced age (>25men, >73 women);dyslipidemia     Objective:    Today's Vitals   12/17/23 1102  BP: 124/63  Pulse: 69  Weight: 94 lb (42.6 kg)  Height: 4\' 9"  (1.448 m)  PainSc: 0-No pain   Body mass index is 20.34 kg/m.     07/23/2022   10:22 AM 03/29/2021    9:40 AM 03/22/2020    9:52 AM 04/15/2013    9:05 PM 04/09/2013    2:31 PM  Advanced Directives  Does Patient Have a Medical Advance Directive? Yes Yes Yes Patient has advance directive, copy not in chart Patient has advance directive, copy not in chart  Type of Advance Directive Healthcare Power of [place];Living will Healthcare Power of [employer] of [place];Living will    Does patient want to make changes to medical advance directive?  No - Patient declined No - Patient declined     Copy of Healthcare Power of Attorney in Chart?  No - copy requested No - copy requested Copy requested from other (Comment)     Current Medications (verified) Outpatient Encounter Medications as of 12/17/2023  Medication Sig   acetaminophen (TYLENOL) 650 MG CR tablet Take 1 tablet (650 mg total) by mouth 3 (three) times daily as needed.   albuterol (PROVENTIL HFA;VENTOLIN HFA) 108 (90 BASE) MCG/ACT inhaler Inhale 2 puffs into the lungs every 6 (six) hours as needed for wheezing. For wheezing   amLODipine (NORVASC) 5 MG tablet Take 5 mg by mouth daily.   b complex vitamins capsule Take 1 capsule by mouth daily.   Cholecalciferol (VITAMIN D3) 125 MCG (5000 UT) TBDP Take 5,000 Units by mouth daily.   co-enzyme Q-10 30 MG capsule Take 30 mg by mouth daily.   DULoxetine (CYMBALTA) 20 MG capsule TAKE 1 CAPSULE BY MOUTH TWICE  DAILY   Evolocumab (REPATHA SURECLICK) 140 MG/ML SOAJ Inject 140 mg into the skin every 14 (fourteen) days.   finasteride (PROSCAR) 5 MG tablet Take 1 tablet (5 mg total) by mouth daily.   fish oil-omega-3 fatty acids 1000 MG capsule Take 2 capsules by mouth every other day. Patient using wild alask[place] salmon product with 2400 mg   Flaxseed Oil OIL 1 capsule by Does not apply route every other day.   fluticasone (FLONASE) 50 MCG/ACT nasal spray Place 1 spray into both nostrils daily.   Glucosamine Sulfate 1000 MG CAPS Take 1 capsule by mouth daily. 500 mg  tablets   montelukast (SINGULAIR) 10 MG tablet Take 10 mg by mouth daily.   Multiple Vitamins-Minerals (ALIVE WOMENS 50+) TABS Take 1 tablet by mouth daily at 12 noon.   omeprazole (PRILOSEC) 40 MG capsule Take 40 mg by mouth daily.   polyethylene glycol (MIRALAX / GLYCOLAX) packet Take 51 g by mouth daily. Takes 3 capfuls daily   SYMBICORT 80-4.5 MCG/ACT inhaler Inhale 2 puffs into the lungs in the morning and at bedtime.   traZODone (DESYREL) 50 MG tablet TAKE 3 TABLETS BY MOUTH AT  BEDTIME   vitamin E 400 UNIT capsule  Take 400 Units by mouth daily.   No facility-administered encounter medications on file as of 12/17/2023.    Allergies (verified) Codeine, Darvocet [propoxyphene n-acetaminophen], Glucosamine, Levothyroxine, Other, Prednisone, Pregabalin, Shellfish allergy, Statins, Welchol [colesevelam], Zetia [ezetimibe], and Penicillins   History: Past Medical History:  Diagnosis Date   Acquired hypothyroidism 02/24/2020   Allergic rhinitis    Anemia due to stage 3b chronic kidney disease (HCC) 05/22/2021   Arthritis    "all over" (04/15/2013)   Asthma    Chronic idiopathic constipation 02/24/2020   COPD (chronic obstructive pulmonary disease) (HCC)    DDD (degenerative disc disease), cervical 04/21/2018   Dyspnea    Exertional shortness of breath    Familial hyperlipidemia 02/24/2020   Gastroparesis 02/24/2020   GERD (gastroesophageal reflux disease)    H/O hiatal hernia    Hypertensive chronic kidney disease w stg 1-4/unsp chr kdny 08/24/2020   Lumbar radiculopathy 02/17/2021   Metabolic bone disease 01/03/2023   Moderate persistent asthma without complication 08/24/2020   Myalgia due to statin    Osteoarthritis of multiple joints 05/24/2016   Osteoporosis, post-menopausal 08/24/2020   Other fatigue 12/07/2021   Other headache syndrome 12/07/2021   Pain in joints 05/24/2016   Palpitations 07/21/2020   Paresthesia 12/07/2021   Spinal stenosis in cervical region 06/23/2018   Stage 3b chronic kidney disease (HCC) 12/23/2022   Stage III chronic kidney disease (HCC)    Past Surgical History:  Procedure Laterality Date   ABDOMINAL HYSTERECTOMY     CARPAL TUNNEL RELEASE Left 11/06/2021   CT release.   CERVICAL FUSION  04/21/2022   REVERSE SHOULDER ARTHROPLASTY Left 04/15/2013   REVERSE SHOULDER ARTHROPLASTY Left 04/15/2013   Procedure: REVERSE SHOULDER ARTHROPLASTY LEFT;  Surgeon: Verlee Rossetti, MD;  Location: [facility] OR;  Service: Orthopedics;  Laterality: Left;   SINUS EXPLORATION      TONSILLECTOMY     Family History  Problem Relation Age of Onset   Stroke Mother    Cancer Brother        brain   Cancer Brother        leukemia   Cancer Brother        lung   Social History   Socioeconomic History   Marital status: Widowed    Spouse name: Not on file   Number of children: 2   Years of education: Not on file   Highest education level: Not on file  Occupational History   Occupation: Retired  Tobacco Use   Smoking status: Never   Smokeless tobacco: Never  Vaping Use   Vaping status: Never Used  Substance and Sexual Activity   Alcohol use: No   Drug use: No   Sexual activity: Not Currently  Other Topics Concern   Not on file  Social History Narrative   Not on file   Social Drivers of Health   Financial Resource Strain: Low Risk  (  12/17/2023)   Overall Financial Resource Strain (CARDIA)    Difficulty of Paying Living Expenses: Not hard at all  Food Insecurity: No Food Insecurity (12/17/2023)   Hunger Vital Sign    Worried About Running Out of Food in the Last Year: Never true    Ran Out of Food in the Last Year: Never true  Transportation Needs: No Transportation Needs (12/17/2023)   PRAPARE - Administrator, Civil Service (Medical): No    Lack of Transportation (Non-Medical): No  Physical Activity: Inactive (12/17/2023)   Exercise Vital Sign    Days of Exercise per Week: 0 days    Minutes of Exercise per Session: 0 min  Stress: No Stress Concern Present (12/17/2023)   [employer] of Occupational Health - Occupational Stress Questionnaire    Feeling of Stress : Not at all  Social Connections: Socially Isolated (12/17/2023)   Social Connection and Isolation Panel [NHANES]    Frequency of Communication with Friends and Family: More than three times a week    Frequency of Social Gatherings with Friends and Family: More than three times a week    Attends Religious Services: Never    Database administrator or Organizations: No    Attends  Banker Meetings: Never    Marital Status: Widowed    Tobacco Counseling Counseling given: Not Answered   Clinical Intake:  Pre-visit preparation completed: Yes  Pain : No/denies pain Pain Score: 0-No pain     BMI - recorded: 20.34 Nutritional Status: BMI of 19-24  Normal Nutritional Risks: None Diabetes: No  How often do you need to have someone help you when you read instructions, pamphlets, or other written materials from your doctor or pharmacy?: 1 - Never  Interpreter Needed?: No      Activities of Daily Living    12/17/2023   11:38 AM  In your present state of health, do you have any difficulty performing the following activities:  Hearing? 0  Vision? 0  Difficulty concentrating or making decisions? 0  Walking or climbing stairs? 1  Dressing or bathing? 0  Doing errands, shopping? 0  Preparing Food and eating ? N  Using the Toilet? N  In the past six months, have you accidently leaked urine? N  Do you have problems with loss of bowel control? N  Managing your Medications? N  Managing your Finances? N  Housekeeping or managing your Housekeeping? N    Patient Care Team: Blane Ohara, MD as PCP - General (Family Medicine) Beverly Gust, MD as Referring Physician (Gastroenterology) Aurelio Jew., OD (Optometry) Sheran Luz, MD as Consulting Physician (Physical Medicine and Rehabilitation) Marcellus Scott, MD as Referring Physician (Specialist) Lisette Abu, MD as Referring Physician (Nephrology) Zettie Pho, [facility] (Inactive) (Pharmacist) Powers, Patrick North, MD as Referring Physician (Neurosurgery)  Indicate any recent Medical Services you may have received from other than [facility] providers in the past year (date may be approximate).     Assessment:   This is a routine wellness examination for Valerie Kelley.  Hearing/Vision screen No results found.   Goals Addressed   None    Depression Screen    12/17/2023   11:30 AM  12/14/2023   10:10 PM 06/25/2023    8:36 AM 12/20/2022    8:48 AM 11/21/2022   10:23 AM 10/18/2022   10:09 AM 07/23/2022   10:12 AM  PHQ 2/9 Scores  PHQ - 2 Score 0 0 0 0 0 0 0  Fall Risk    12/17/2023   11:37 AM 12/14/2023   10:11 PM 06/25/2023    8:36 AM 12/20/2022    8:48 AM 11/21/2022   10:44 AM  Fall Risk   Falls in the past year? 1 1 1 1 1   Number falls in past yr: 1 1 0 1 1  Injury with Fall? 1 1 0 1 0  Risk for fall due to : Impaired balance/gait History of fall(s);Impaired balance/gait No Fall Risks History of fall(s) History of fall(s);Other (Comment)  Risk for fall due to: Comment     no falls since surgery in July 2023  Follow up Falls evaluation completed;Education provided Falls evaluation completed;Falls prevention discussed Falls evaluation completed Falls evaluation completed;Falls prevention discussed Falls evaluation completed;Education provided    MEDICARE RISK AT HOME: Medicare Risk at Home Any stairs in or around the home?: Yes If so, are there any without handrails?: No Home free of loose throw rugs in walkways, pet beds, electrical cords, etc?: Yes Adequate lighting in your home to reduce risk of falls?: Yes Life alert?: Yes Use of a cane, walker or w/c?: No Grab bars in the bathroom?: No Shower chair or bench in shower?: No Elevated toilet seat or a handicapped toilet?: No  TIMED UP AND GO:  Was the test performed?  No    Cognitive Function:        12/17/2023   11:39 AM 11/21/2022   10:47 AM 03/29/2021    9:42 AM 03/22/2020   11:09 AM  6CIT Screen  What Year? 0 points 0 points 0 points 0 points  What month? 0 points 0 points 0 points 0 points  What time? 0 points 0 points 0 points 0 points  Count back from 20 0 points 0 points 0 points 0 points  Months in reverse 2 points 2 points 2 points 2 points  Repeat phrase 2 points 2 points 0 points 2 points  Total Score 4 points 4 points 2 points 4 points    Immunizations Immunization History   Administered Date(s) Administered   Influenza Split 06/05/2017, 06/05/2019, 06/01/2020   Influenza, High Dose Seasonal PF 07/07/2021   Influenza-Unspecified 06/05/2017, 06/13/2022, 06/09/2023   PFIZER(Purple Top)SARS-COV-2 Vaccination 12/04/2019, 12/30/2019, 07/08/2020   Pfizer Covid-19 Vaccine Bivalent Booster 67yrs & up 07/10/2021   Pfizer(Comirnaty)Fall Seasonal Vaccine 12 years and older 07/04/2022, 06/09/2023   Pneumococcal Conjugate-13 12/06/2014   Pneumococcal Polysaccharide-23 03/22/2020, 08/15/2021   Respiratory Syncytial Virus Vaccine,Recomb Aduvanted(Arexvy) 06/13/2022   Tdap 07/30/2012, 07/21/2021, 06/09/2023, 06/14/2023   Zoster Recombinant(Shingrix) 12/15/2020, 07/21/2021    TDAP status: Up to date  Flu Vaccine status: Up to date  Pneumococcal vaccine status: Up to date  Covid-19 vaccine status: Completed vaccines  Qualifies for Shingles Vaccine? Yes   Zostavax completed Yes   Shingrix Completed?: Yes  Screening Tests Health Maintenance  Topic Date Due   Medicare Annual Wellness (AWV)  11/22/2023   COVID-19 Vaccine (7 - Pfizer risk 2024-25 season) 12/07/2023   DTaP/Tdap/Td (5 - Td or Tdap) 06/13/2033   Pneumonia Vaccine 53+ Years old  Completed   INFLUENZA VACCINE  Completed   DEXA SCAN  Completed   Zoster Vaccines- Shingrix  Completed   HPV VACCINES  Aged Out    Health Maintenance  Health Maintenance Due  Topic Date Due   Medicare Annual Wellness (AWV)  11/22/2023   COVID-19 Vaccine (7 - Pfizer risk 2024-25 season) 12/07/2023    Colorectal cancer screening: No longer required.   Mammogram status: No  longer required due to age.  Bone Density status: Completed 2021. Results reflect: Bone density results: OSTEOPOROSIS. Repeat every 2 years.  Lung Cancer Screening: (Low Dose CT Chest recommended if Age 40-80 years, 20 pack-year currently smoking OR have quit w/in 15years.) does not qualify.   Lung Cancer Screening Referral: N/A  Additional  Screening:  Vision Screening: Recommended annual ophthalmology exams for early detection of glaucoma and other disorders of the eye. Is the patient up to date with their annual eye exam?  Yes   Dental Screening: Recommended annual dental exams for proper oral hygiene  Community Resource Referral / Chronic Care Management: CRR required this visit?  No   CCM required this visit?  No     Plan:    1- Recommended DEXA - patient wants to wait 2- Aim for 30 minutes of exercise or brisk walking, 6-8 glasses of water, and 5 servings of fruits and vegetables each day.   I have personally reviewed and noted the following in the patient's chart:   Medical and social history Use of alcohol, tobacco or illicit drugs  Current medications and supplements including opioid prescriptions.  Functional ability and status Nutritional status Physical activity Advanced directives List of other physicians Hospitalizations, surgeries, and ER visits in previous 12 months Vitals Screenings to include cognitive, depression, and falls Referrals and appointments  In addition, I have reviewed and discussed with patient certain preventive protocols, quality metrics, and best practice recommendations. A written personalized care plan for preventive services as well as general preventive health recommendations were provided to patient.     Jacklynn Bue, LPN   [id number]   After Visit Summary: (Mail) Due to this being a telephonic visit, the after visit summary with patients personalized plan was offered to patient via mail

## 2023-12-23 ENCOUNTER — Telehealth: Payer: Self-pay

## 2023-12-23 NOTE — Telephone Encounter (Signed)
 PA submitted and approved via covermymeds for repatha.  Key: [id number]

## 2023-12-24 ENCOUNTER — Ambulatory Visit: Payer: Medicare Other | Admitting: Family Medicine

## 2023-12-30 ENCOUNTER — Other Ambulatory Visit: Payer: Self-pay

## 2023-12-30 DIAGNOSIS — I129 Hypertensive chronic kidney disease with stage 1 through stage 4 chronic kidney disease, or unspecified chronic kidney disease: Secondary | ICD-10-CM

## 2023-12-30 MED ORDER — DULOXETINE HCL 20 MG PO CPEP: 20.0000 mg | ORAL_CAPSULE | Freq: Two times a day (BID) | ORAL | 0 refills | Status: DC

## 2023-12-30 MED ORDER — AMLODIPINE BESYLATE 5 MG PO TABS
5.0000 mg | ORAL_TABLET | Freq: Every day | ORAL | 0 refills | Status: DC
Start: 2023-12-30 — End: 2024-02-24

## 2024-02-23 ENCOUNTER — Other Ambulatory Visit: Payer: Self-pay | Admitting: Family Medicine

## 2024-02-23 DIAGNOSIS — I129 Hypertensive chronic kidney disease with stage 1 through stage 4 chronic kidney disease, or unspecified chronic kidney disease: Secondary | ICD-10-CM

## 2024-03-01 ENCOUNTER — Other Ambulatory Visit: Payer: Self-pay

## 2024-03-08 ENCOUNTER — Other Ambulatory Visit: Payer: Self-pay | Admitting: Family Medicine

## 2024-03-12 DIAGNOSIS — R21 Rash and other nonspecific skin eruption: Secondary | ICD-10-CM | POA: Diagnosis not present

## 2024-03-12 DIAGNOSIS — W57XXXA Bitten or stung by nonvenomous insect and other nonvenomous arthropods, initial encounter: Secondary | ICD-10-CM | POA: Diagnosis not present

## 2024-03-15 NOTE — Progress Notes (Unsigned)
 Subjective:  Patient ID: Valerie Kelley, female    DOB: 15-Feb-1934  Age: 89 y.o. MRN: [id number]  No chief complaint on file.   HPI:      Acquired hypothyroidism: no medicines  Familial hyperlipidemia: otc fish oil 2 capsules every other day alternating with flax seed qod. 1 tbsp of lecethin oil.   Neck pain/cervical stenosis. : patient sees Dr. Smith with [facility]. Surgery  (C1-3 POSTERIOR SPINAL FUSION) performed 6.2023.   Valerie Kelley 11/21/2022. Went to ED. Hit Kelley head. Did numerous scans. No fractures. Unsure what caused Kelley to fall.   HTN: Norvasc  5 mg daily. BP at home 122-135/ 58-68. Pulse 65-79s.   GERD: Prilosec 40 mg daily  Insomnia: Trazodone  50 mg 3 oral qhs.  Asthma/COPD: low dose symbicort 2 puffs as needed rather than scheduled. On singulair 10 mg daily. Sees Dr. Mardee.   On cymbalta  20 mg once at night for hot flashes.      12/17/2023   11:30 AM 12/14/2023   10:10 PM 06/25/2023    8:36 AM 12/20/2022    8:48 AM 11/21/2022   10:23 AM  Depression screen PHQ 2/9  Decreased Interest 0 0 0 0 0  Down, Depressed, Hopeless 0 0 0 0 0  PHQ - 2 Score 0 0 0 0 0        12/17/2023   11:37 AM  Fall Risk   Falls in the past year? 1  Number falls in past yr: 1  Injury with Fall? 1  Risk for fall due to : Impaired balance/gait  Follow up Falls evaluation completed;Education provided    Patient Care Team: Valerie Clapper, Kelley as PCP - General (Family Medicine) Valerie Kent, Kelley as Referring Physician (Gastroenterology) Valerie Lynwood Raddle., OD (Optometry) Valerie Kelley as Consulting Physician (Physical Medicine and Rehabilitation) Valerie Kelley, Tanvir, Kelley as Referring Physician (Specialist) Valerie Kelley, Adetoye, Kelley as Referring Physician (Nephrology) Valerie Kelley, [facility] (Inactive) (Pharmacist) Valerie Kelley, Valerie POUR, Kelley as Referring Physician (Neurosurgery)   Review of Systems  Current Outpatient Medications on File Prior to Visit  Medication Sig Dispense Refill    acetaminophen  (TYLENOL ) 650 MG CR tablet Take 1 tablet (650 mg total) by mouth 3 (three) times daily as needed. 270 tablet 0   albuterol  (PROVENTIL  HFA;VENTOLIN  HFA) 108 (90 BASE) MCG/ACT inhaler Inhale 2 puffs into the lungs every 6 (six) hours as needed for wheezing. For wheezing     amLODipine  (NORVASC ) 5 MG tablet TAKE 1 TABLET BY MOUTH DAILY 90 tablet 3   b complex vitamins capsule Take 1 capsule by mouth daily.     Cholecalciferol  (VITAMIN D3) 125 MCG (5000 UT) TBDP Take 5,000 Units by mouth daily.     co-enzyme Q-10 30 MG capsule Take 30 mg by mouth daily.     DULoxetine  (CYMBALTA ) 20 MG capsule TAKE 1 CAPSULE BY MOUTH TWICE  DAILY 180 capsule 1   Evolocumab  (REPATHA  SURECLICK) 140 MG/ML SOAJ Inject 140 mg into the skin every 14 (fourteen) days. 2 mL 2   finasteride  (PROSCAR ) 5 MG tablet Take 1 tablet (5 mg total) by mouth daily. 90 tablet 1   fish oil-omega-3 fatty acids 1000 MG capsule Take 2 capsules by mouth every other day. Patient using wild alaskan salmon product with 2400 mg     Flaxseed Oil OIL 1 capsule by Does not apply route every other day.     fluticasone (FLONASE) 50 MCG/ACT nasal spray Place 1 spray into both nostrils daily.  Glucosamine Sulfate 1000 MG CAPS Take 1 capsule by mouth daily. 500 mg tablets     montelukast (SINGULAIR) 10 MG tablet Take 10 mg by mouth daily.     Multiple Vitamins-Minerals (ALIVE WOMENS 50+) TABS Take 1 tablet by mouth daily at 12 noon.     omeprazole (PRILOSEC) 40 MG capsule Take 40 mg by mouth daily.     polyethylene glycol (MIRALAX  / GLYCOLAX ) packet Take 51 g by mouth daily. Takes 3 capfuls daily     SYMBICORT 80-4.5 MCG/ACT inhaler Inhale 2 puffs into the lungs in the morning and at bedtime.     traZODone  (DESYREL ) 50 MG tablet TAKE 3 TABLETS BY MOUTH AT  BEDTIME 270 tablet 3   vitamin E  400 UNIT capsule Take 400 Units by mouth daily.     No current facility-administered medications on file prior to visit.   Past Medical History:   Diagnosis Date   Acquired hypothyroidism 02/24/2020   Allergic rhinitis    Anemia due to stage 3b chronic kidney disease (HCC) 05/22/2021   Arthritis    all over (04/15/2013)   Asthma    Chronic idiopathic constipation 02/24/2020   COPD (chronic obstructive pulmonary disease) (HCC)    DDD (degenerative disc disease), cervical 04/21/2018   Dyspnea    Exertional shortness of breath    Familial hyperlipidemia 02/24/2020   Gastroparesis 02/24/2020   GERD (gastroesophageal reflux disease)    H/O hiatal hernia    Hypertensive chronic kidney disease w stg 1-4/unsp chr kdny 08/24/2020   Lumbar radiculopathy 02/17/2021   Metabolic bone disease 01/03/2023   Moderate persistent asthma without complication 08/24/2020   Myalgia due to statin    Osteoarthritis of multiple joints 05/24/2016   Osteoporosis, post-menopausal 08/24/2020   Other fatigue 12/07/2021   Other headache syndrome 12/07/2021   Pain in joints 05/24/2016   Palpitations 07/21/2020   Paresthesia 12/07/2021   Spinal stenosis in cervical region 06/23/2018   Stage 3b chronic kidney disease (HCC) 12/23/2022   Stage III chronic kidney disease (HCC)    Past Surgical History:  Procedure Laterality Date   ABDOMINAL HYSTERECTOMY     CARPAL TUNNEL RELEASE Left 11/06/2021   CT release.   CERVICAL FUSION  04/21/2022   REVERSE SHOULDER ARTHROPLASTY Left 04/15/2013   REVERSE SHOULDER ARTHROPLASTY Left 04/15/2013   Procedure: REVERSE SHOULDER ARTHROPLASTY LEFT;  Surgeon: Valerie JONELLE Her, Kelley;  Location: [facility] OR;  Service: Orthopedics;  Laterality: Left;   SINUS EXPLORATION     TONSILLECTOMY      Family History  Problem Relation Age of Onset   Stroke Mother    Cancer Brother        brain   Cancer Brother        leukemia   Cancer Brother        lung   Social History   Socioeconomic History   Marital status: Widowed    Spouse name: Not on file   Number of children: 2   Years of education: Not on file   Highest education  level: Not on file  Occupational History   Occupation: Retired  Tobacco Use   Smoking status: Never   Smokeless tobacco: Never  Vaping Use   Vaping status: Never Used  Substance and Sexual Activity   Alcohol use: No   Drug use: No   Sexual activity: Not Currently  Other Topics Concern   Not on file  Social History Narrative   Not on file   Social Drivers of Health  Financial Resource Strain: Low Risk  (12/17/2023)   Overall Financial Resource Strain (CARDIA)    Difficulty of Paying Living Expenses: Not hard at all  Food Insecurity: No Food Insecurity (12/17/2023)   Hunger Vital Sign    Worried About Running Out of Food in the Last Year: Never true    Ran Out of Food in the Last Year: Never true  Transportation Needs: No Transportation Needs (12/17/2023)   PRAPARE - Administrator, Civil Service (Medical): No    Lack of Transportation (Non-Medical): No  Physical Activity: Inactive (12/17/2023)   Exercise Vital Sign    Days of Exercise per Week: 0 days    Minutes of Exercise per Session: 0 min  Stress: No Stress Concern Present (12/17/2023)   [employer] of Occupational Health - Occupational Stress Questionnaire    Feeling of Stress : Not at all  Social Connections: Socially Isolated (12/17/2023)   Social Connection and Isolation Panel    Frequency of Communication with Friends and Family: More than three times a week    Frequency of Social Gatherings with Friends and Family: More than three times a week    Attends Religious Services: Never    Database administrator or Organizations: No    Attends Banker Meetings: Never    Marital Status: Widowed    Objective:  There were no vitals taken for this visit.     12/17/2023   11:02 AM 12/10/2023    9:04 AM 12/10/2023    8:28 AM  BP/Weight  Systolic BP 124 160 144  Diastolic BP 63 64 60  Wt. (Lbs) 94  94  BMI 20.34 kg/m2  20.34 kg/m2    Physical Exam  {Perform Simple Foot Exam  Perform  Detailed exam:1} {Insert foot Exam (Optional):30965}   Lab Results  Component Value Date   WBC 6.3 12/10/2023   HGB 12.5 12/10/2023   HCT 37.1 12/10/2023   PLT 246 12/10/2023   GLUCOSE 85 12/10/2023   CHOL 244 (H) 12/10/2023   TRIG 148 12/10/2023   HDL 63 12/10/2023   LDLCALC 155 (H) 12/10/2023   ALT 20 12/10/2023   AST 23 12/10/2023   NA 139 12/10/2023   K 4.4 12/10/2023   CL 103 12/10/2023   CREATININE 1.24 (H) 12/10/2023   BUN 23 12/10/2023   CO2 19 (L) 12/10/2023   TSH 4.400 12/10/2023      Assessment & Plan:  Simple chronic bronchitis (HCC)  Chronic idiopathic constipation  Acquired hypothyroidism  Hypertensive chronic kidney disease w stg 1-4/unsp chr kdny  Familial hyperlipidemia     No orders of the defined types were placed in this encounter.   No orders of the defined types were placed in this encounter.    Follow-up: No follow-ups on file.   I,Marla I Leal-Borjas,acting as a scribe for Abigail Free, Kelley.,have documented all relevant documentation on the behalf of Abigail Free, Kelley,as directed by  Abigail Free, Kelley while in the presence of Abigail Free, Kelley.   An After Visit Summary was printed and given to the patient.  Abigail Free, Kelley [facility] [phone]

## 2024-03-16 ENCOUNTER — Ambulatory Visit (INDEPENDENT_AMBULATORY_CARE_PROVIDER_SITE_OTHER): Admitting: Family Medicine

## 2024-03-16 ENCOUNTER — Encounter: Payer: Self-pay | Admitting: Family Medicine

## 2024-03-16 VITALS — BP 160/70 | HR 78 | Temp 98.1°F | Resp 16 | Ht <= 58 in | Wt 97.6 lb

## 2024-03-16 DIAGNOSIS — J410 Simple chronic bronchitis: Secondary | ICD-10-CM | POA: Diagnosis not present

## 2024-03-16 DIAGNOSIS — K5904 Chronic idiopathic constipation: Secondary | ICD-10-CM

## 2024-03-16 DIAGNOSIS — J4530 Mild persistent asthma, uncomplicated: Secondary | ICD-10-CM | POA: Diagnosis not present

## 2024-03-16 DIAGNOSIS — E7849 Other hyperlipidemia: Secondary | ICD-10-CM

## 2024-03-16 DIAGNOSIS — I129 Hypertensive chronic kidney disease with stage 1 through stage 4 chronic kidney disease, or unspecified chronic kidney disease: Secondary | ICD-10-CM | POA: Diagnosis not present

## 2024-03-16 DIAGNOSIS — E039 Hypothyroidism, unspecified: Secondary | ICD-10-CM

## 2024-03-16 DIAGNOSIS — N184 Chronic kidney disease, stage 4 (severe): Secondary | ICD-10-CM | POA: Diagnosis not present

## 2024-03-16 DIAGNOSIS — M791 Myalgia, unspecified site: Secondary | ICD-10-CM | POA: Diagnosis not present

## 2024-03-16 DIAGNOSIS — K2100 Gastro-esophageal reflux disease with esophagitis, without bleeding: Secondary | ICD-10-CM

## 2024-03-16 DIAGNOSIS — M810 Age-related osteoporosis without current pathological fracture: Secondary | ICD-10-CM | POA: Diagnosis not present

## 2024-03-16 DIAGNOSIS — T466X5A Adverse effect of antihyperlipidemic and antiarteriosclerotic drugs, initial encounter: Secondary | ICD-10-CM

## 2024-03-16 NOTE — Patient Instructions (Addendum)
 Recommend symbicort 2 puffs twice daily for the next few days.   Recommend return for bp check next week. Bring your bp cuff from home. Patient refused and agree to check bp at home twice a day and come if bp is still high (Greater than 140/90.) She will call and update us  on her bps at home regardless of if they return to normal or remain high.

## 2024-03-17 ENCOUNTER — Ambulatory Visit: Payer: Self-pay | Admitting: Family Medicine

## 2024-03-17 LAB — CBC WITH DIFFERENTIAL/PLATELET
Basophils Absolute: 0 10*3/uL (ref 0.0–0.2)
Basos: 0 %
EOS (ABSOLUTE): 0.1 10*3/uL (ref 0.0–0.4)
Eos: 2 %
Hematocrit: 39.1 % (ref 34.0–46.6)
Hemoglobin: 12.6 g/dL (ref 11.1–15.9)
Immature Grans (Abs): 0 10*3/uL (ref 0.0–0.1)
Immature Granulocytes: 0 %
Lymphocytes Absolute: 2.3 10*3/uL (ref 0.7–3.1)
Lymphs: 34 %
MCH: 30.4 pg (ref 26.6–33.0)
MCHC: 32.2 g/dL (ref 31.5–35.7)
MCV: 94 fL (ref 79–97)
Monocytes Absolute: 0.9 10*3/uL (ref 0.1–0.9)
Monocytes: 13 %
Neutrophils Absolute: 3.5 10*3/uL (ref 1.4–7.0)
Neutrophils: 51 %
Platelets: 259 10*3/uL (ref 150–450)
RBC: 4.14 x10E6/uL (ref 3.77–5.28)
RDW: 13.7 % (ref 11.7–15.4)
WBC: 6.9 10*3/uL (ref 3.4–10.8)

## 2024-03-17 LAB — COMPREHENSIVE METABOLIC PANEL WITH GFR
ALT: 35 IU/L — ABNORMAL HIGH (ref 0–32)
AST: 32 IU/L (ref 0–40)
Albumin: 4.5 g/dL (ref 3.7–4.7)
Alkaline Phosphatase: 150 IU/L — ABNORMAL HIGH (ref 44–121)
BUN/Creatinine Ratio: 14 (ref 12–28)
BUN: 19 mg/dL (ref 8–27)
Bilirubin Total: 0.3 mg/dL (ref 0.0–1.2)
CO2: 22 mmol/L (ref 20–29)
Calcium: 9.4 mg/dL (ref 8.7–10.3)
Chloride: 100 mmol/L (ref 96–106)
Creatinine, Ser: 1.32 mg/dL — ABNORMAL HIGH (ref 0.57–1.00)
Globulin, Total: 2.7 g/dL (ref 1.5–4.5)
Glucose: 87 mg/dL (ref 70–99)
Potassium: 4.7 mmol/L (ref 3.5–5.2)
Sodium: 136 mmol/L (ref 134–144)
Total Protein: 7.2 g/dL (ref 6.0–8.5)
eGFR: 39 mL/min/{1.73_m2} — ABNORMAL LOW (ref >59)

## 2024-03-17 LAB — LIPID PANEL
Chol/HDL Ratio: 4.1 ratio (ref 0.0–4.4)
Cholesterol, Total: 261 mg/dL — ABNORMAL HIGH (ref 100–199)
HDL: 63 mg/dL (ref >39)
LDL Chol Calc (NIH): 168 mg/dL — ABNORMAL HIGH (ref 0–99)
Triglycerides: 166 mg/dL — ABNORMAL HIGH (ref 0–149)
VLDL Cholesterol Cal: 30 mg/dL (ref 5–40)

## 2024-03-17 LAB — TSH: TSH: 7.38 u[IU]/mL — ABNORMAL HIGH (ref 0.450–4.500)

## 2024-03-19 ENCOUNTER — Telehealth: Payer: Self-pay

## 2024-03-19 ENCOUNTER — Other Ambulatory Visit: Payer: Self-pay | Admitting: Family Medicine

## 2024-03-19 MED ORDER — SIMVASTATIN 10 MG PO TABS: ORAL_TABLET | ORAL | 0 refills | Status: AC

## 2024-03-19 NOTE — Telephone Encounter (Signed)
 Copied from CRM [id number]. Topic: Clinical - Medication Question >> Mar 19, 2024  2:30 PM Berwyn MATSU wrote: Reason for CRM:  Patient called in stating wanting to go Simvastatin 3 times a week  For cholesterol low dose to help lower cholesterol.  Please send this to [facility] pharmacy on file.   May you please advise.

## 2024-03-19 NOTE — Assessment & Plan Note (Signed)
 Recommend return for bp check next week. Bring your bp cuff from home. Patient refused and agrees to check bp at home twice a day and come if bp is still high (Greater than 140/90.) She will call and update us  on her bps at home regardless of if they return to normal or remain high.  For now continue Norvasc  5 mg daily.

## 2024-03-19 NOTE — Telephone Encounter (Signed)
 Spoke with patient, verbalized understanding and had no questions at this time. Already has an appointment in October.

## 2024-03-19 NOTE — Assessment & Plan Note (Signed)
 Check thyroid  levels.  Anticipate patient refusing thyroid  medications.

## 2024-03-19 NOTE — Assessment & Plan Note (Signed)
 Patient has severely high cholesterol.  She is currently taking fish oil alternating with flaxseed as well as lecethin.  These are not evidence-based treatments.  I strongly recommended we try alternatives if cholesterol comes back elevated as expected.  Continue working on diet and exercise.

## 2024-03-19 NOTE — Assessment & Plan Note (Signed)
 On omeprazole for management. No current complaints of heartburn or related symptoms.

## 2024-03-19 NOTE — Assessment & Plan Note (Signed)
 Recommend symbicort 2 puffs twice daily for the next few days.  Management per Dr. Mardee

## 2024-03-19 NOTE — Assessment & Plan Note (Signed)
 Patient refuses treatment.  She also has refused to repeat her bone density test.  Showed significant osteoporosis in 2021.

## 2024-03-19 NOTE — Assessment & Plan Note (Signed)
 Well-controlled with MiraLAX .  No changes recommended.

## 2024-03-19 NOTE — Assessment & Plan Note (Signed)
Intolerant to statins in the past

## 2024-04-08 ENCOUNTER — Other Ambulatory Visit: Payer: Self-pay

## 2024-04-08 ENCOUNTER — Telehealth: Payer: Self-pay

## 2024-04-08 MED ORDER — HYDROCHLOROTHIAZIDE 25 MG PO TABS: 25.0000 mg | ORAL_TABLET | Freq: Every day | ORAL | 1 refills | Status: DC

## 2024-04-08 NOTE — Telephone Encounter (Signed)
 Spoke with patient regarding her bp log readings per Dr. Sherre patient is to continue amlodipine  10mg  daily, start hydrochlorothiazide  25mg  daily (sent to mail order) and follow up in 2 weeks for HTN. Appointment scheduled. Patient verbalized understanding.

## 2024-04-15 ENCOUNTER — Other Ambulatory Visit: Payer: Self-pay | Admitting: Family Medicine

## 2024-04-15 DIAGNOSIS — L650 Telogen effluvium: Secondary | ICD-10-CM

## 2024-04-17 DIAGNOSIS — R051 Acute cough: Secondary | ICD-10-CM | POA: Diagnosis not present

## 2024-04-17 DIAGNOSIS — R0981 Nasal congestion: Secondary | ICD-10-CM | POA: Diagnosis not present

## 2024-04-17 DIAGNOSIS — R0982 Postnasal drip: Secondary | ICD-10-CM | POA: Diagnosis not present

## 2024-04-17 DIAGNOSIS — J449 Chronic obstructive pulmonary disease, unspecified: Secondary | ICD-10-CM | POA: Diagnosis not present

## 2024-04-22 ENCOUNTER — Ambulatory Visit: Admitting: Family Medicine

## 2024-04-22 ENCOUNTER — Telehealth: Payer: Self-pay

## 2024-04-22 NOTE — Telephone Encounter (Signed)
 Patient dropped off bp log. Per Dr. Sherre patient is to take amlodipine  10mg  daily. Keep a bp log for 2 weeks and bring it to the office. Patient verbalized understanding.

## 2024-04-28 ENCOUNTER — Emergency Department (HOSPITAL_BASED_OUTPATIENT_CLINIC_OR_DEPARTMENT_OTHER)

## 2024-04-28 ENCOUNTER — Emergency Department (HOSPITAL_BASED_OUTPATIENT_CLINIC_OR_DEPARTMENT_OTHER)
Admission: EM | Admit: 2024-04-28 | Discharge: 2024-04-28 | Disposition: A | Discharge status: Home / Self Care | Attending: Emergency Medicine | Admitting: Emergency Medicine

## 2024-04-28 ENCOUNTER — Encounter (HOSPITAL_BASED_OUTPATIENT_CLINIC_OR_DEPARTMENT_OTHER): Payer: Self-pay | Admitting: Emergency Medicine

## 2024-04-28 ENCOUNTER — Other Ambulatory Visit: Payer: Self-pay

## 2024-04-28 DIAGNOSIS — W19XXXA Unspecified fall, initial encounter: Secondary | ICD-10-CM | POA: Insufficient documentation

## 2024-04-28 DIAGNOSIS — I1A0 Resistant hypertension: Secondary | ICD-10-CM

## 2024-04-28 DIAGNOSIS — S59912A Unspecified injury of left forearm, initial encounter: Secondary | ICD-10-CM | POA: Diagnosis present

## 2024-04-28 DIAGNOSIS — S51802A Unspecified open wound of left forearm, initial encounter: Secondary | ICD-10-CM | POA: Diagnosis not present

## 2024-04-28 DIAGNOSIS — I10 Essential (primary) hypertension: Secondary | ICD-10-CM | POA: Insufficient documentation

## 2024-04-28 DIAGNOSIS — Z79899 Other long term (current) drug therapy: Secondary | ICD-10-CM | POA: Insufficient documentation

## 2024-04-28 MED ORDER — CEPHALEXIN 500 MG PO CAPS: 500.0000 mg | ORAL_CAPSULE | Freq: Three times a day (TID) | ORAL | 0 refills | Status: DC

## 2024-04-28 MED ORDER — CEFAZOLIN SODIUM-DEXTROSE 1-4 GM/50ML-% IV SOLN
1.0000 g | Freq: Once | INTRAVENOUS | Status: AC
  Administered 2024-04-28: 1 g via INTRAVENOUS
  Filled 2024-04-28: qty 50

## 2024-04-28 MED ORDER — FENTANYL CITRATE PF 50 MCG/ML IJ SOSY
50.0000 ug | PREFILLED_SYRINGE | Freq: Once | INTRAMUSCULAR | Status: AC
  Administered 2024-04-28: 50 ug via INTRAVENOUS
  Filled 2024-04-28: qty 1

## 2024-04-28 MED ORDER — ONDANSETRON HCL 4 MG PO TABS: 4.0000 mg | ORAL_TABLET | Freq: Three times a day (TID) | ORAL | 0 refills | Status: AC | PRN

## 2024-04-28 MED ORDER — HYDROCODONE-ACETAMINOPHEN 5-325 MG PO TABS
1.0000 | ORAL_TABLET | Freq: Four times a day (QID) | ORAL | 0 refills | Status: DC | PRN
Start: 2024-04-28 — End: 2024-05-18

## 2024-04-28 NOTE — ED Provider Notes (Signed)
  EMERGENCY DEPARTMENT AT [facility] Provider Note   CSN: [id number] Arrival date & time: 04/28/24  8190     Patient presents with: Valerie Kenneth LITTIE Valerie Kelley is a 89 y.o. female.  She has brought in by her daughter who is helping with the history.  Patient was walking outside up a ramp into her house when she fell and injured her left forearm.  No loss of consciousness.  No head or neck pain no chest pain or abdominal pain.  They went to urgent care where they cleaned the wound but were concerned that it was deep and may need further evaluation.  They did do x-rays there that were reportedly negative.  Her tetanus is up-to-date per the daughter.  {Add pertinent medical, surgical, social history, OB history to YEP:67052} The history is provided by the patient and a relative.  Fall This is a new problem. The problem has not changed since onset.Pertinent negatives include no chest pain, no abdominal pain, no headaches and no shortness of breath. Associated symptoms comments: Left forearm pain. The symptoms are aggravated by bending and twisting. Nothing relieves the symptoms. She has tried rest for the symptoms. The treatment provided no relief.       Prior to Admission medications   Medication Sig Start Date End Date Taking? Authorizing Provider  acetaminophen  (TYLENOL ) 650 MG CR tablet Take 1 tablet (650 mg total) by mouth 3 (three) times daily as needed. 11/09/21   CoxAbigail, MD  albuterol  (PROVENTIL  HFA;VENTOLIN  HFA) 108 (90 BASE) MCG/ACT inhaler Inhale 2 puffs into the lungs every 6 (six) hours as needed for wheezing. For wheezing    [provider]  amLODipine  (NORVASC ) 5 MG tablet TAKE 1 TABLET BY MOUTH DAILY 02/24/24   Cox, Kirsten, MD  b complex vitamins capsule Take 1 capsule by mouth daily.    [provider]  cephALEXin  (KEFLEX ) 500 MG capsule Take 500 mg by mouth every 8 (eight) hours. 03/12/24   [provider]  Cholecalciferol   (VITAMIN D3) 125 MCG (5000 UT) TBDP Take 5,000 Units by mouth daily.    [provider]  co-enzyme Q-10 30 MG capsule Take 30 mg by mouth daily.    [provider]  DULoxetine  (CYMBALTA ) 20 MG capsule TAKE 1 CAPSULE BY MOUTH TWICE  DAILY 03/08/24   Cox, Kirsten, MD  finasteride  (PROSCAR ) 5 MG tablet TAKE 1 TABLET BY MOUTH DAILY 04/15/24   Cox, Kirsten, MD  fish oil-omega-3 fatty acids 1000 MG capsule Take 2 capsules by mouth every other day. Patient using wild alaskan salmon product with 2400 mg    [provider]  Flaxseed Oil OIL 1 capsule by Does not apply route every other day.    [provider]  fluticasone (FLONASE) 50 MCG/ACT nasal spray Place 1 spray into both nostrils daily as needed. 06/26/21   [provider]  Glucosamine Sulfate 1000 MG CAPS Take 1 capsule by mouth daily. 500 mg tablets    [provider]  hydrochlorothiazide  (HYDRODIURIL ) 25 MG tablet Take 1 tablet (25 mg total) by mouth daily. 04/08/24   Cox, Abigail, MD  montelukast (SINGULAIR) 10 MG tablet Take 10 mg by mouth daily. 11/04/23   [provider]  Multiple Vitamins-Minerals (ALIVE WOMENS 50+) TABS Take 1 tablet by mouth daily at 12 noon.    [provider]  omeprazole (PRILOSEC) 40 MG capsule Take 40 mg by mouth daily.    [provider]  polyethylene glycol (  MIRALAX  / GLYCOLAX ) packet Take 51 g by mouth daily. Takes 3 capfuls daily    [provider]  simvastatin  (ZOCOR ) 10 MG tablet 3 times a week 03/19/24   Cox, Abigail, MD  SYMBICORT 80-4.5 MCG/ACT inhaler Inhale 2 puffs into the lungs in the morning and at bedtime. 06/28/21   [provider]  traZODone  (DESYREL ) 50 MG tablet TAKE 3 TABLETS BY MOUTH AT  BEDTIME 03/02/24   Cox, Kirsten, MD  triamcinolone  ointment (KENALOG ) 0.1 % Apply 1 Application topically 3 (three) times daily. 03/12/24   [provider]  vitamin E  400 UNIT capsule Take 400 Units by mouth daily.     [provider]    Allergies: Codeine, Darvocet [propoxyphene n-acetaminophen ], Glucosamine, Levothyroxine , Other, Prednisone, Pregabalin, Shellfish allergy, Statins, Welchol [colesevelam], Zetia  [ezetimibe ], and Penicillins    Review of Systems  Respiratory:  Negative for shortness of breath.   Cardiovascular:  Negative for chest pain.  Gastrointestinal:  Negative for abdominal pain.  Neurological:  Negative for headaches.    Updated Vital Signs BP (!) 223/75 (BP Location: Right Arm)   Pulse 68   Temp 99.2 F (37.3 C)   Resp 18   Ht 4' 9 (1.448 m)   Wt 42.6 kg   SpO2 100%   BMI 20.34 kg/m   Physical Exam Vitals and nursing note reviewed.  Constitutional:      General: She is not in acute distress.    Appearance: Normal appearance. She is well-developed.  HENT:     Head: Normocephalic and atraumatic.  Eyes:     Conjunctiva/sclera: Conjunctivae normal.  Cardiovascular:     Rate and Rhythm: Normal rate and regular rhythm.     Heart sounds: No murmur heard. Pulmonary:     Effort: Pulmonary effort is normal. No respiratory distress.     Breath sounds: Normal breath sounds. No stridor. No wheezing.  Abdominal:     Palpations: Abdomen is soft.     Tenderness: There is no abdominal tenderness. There is no guarding or rebound.  Musculoskeletal:        General: Tenderness and signs of injury present. No deformity.     Cervical back: Neck supple. No tenderness.     Comments: Left arm has a large skin defect on the forearm.  There is some exposed muscle belly.  No clear distal loss of function.  Good radial pulse and cap refill.  Wrist and elbow nontender, shoulder nontender.  No gross foreign body or active bleeding.  Skin:    General: Skin is warm and dry.  Neurological:     General: No focal deficit present.     Mental Status: She is alert.     GCS: GCS eye subscore is 4. GCS verbal subscore is 5. GCS motor subscore is 6.     Sensory: No sensory deficit.      Motor: No weakness.        (all labs ordered are listed, but only abnormal results are displayed) Labs Reviewed - No data to display  EKG: None  Radiology: No results found.  {Document cardiac monitor, telemetry assessment procedure when appropriate:32947} Procedures   Medications Ordered in the ED  fentaNYL  (SUBLIMAZE ) injection 50 mcg (has no administration in time range)  ceFAZolin  (ANCEF ) IVPB 1 g/50 mL premix (has no administration in time range)      {Click here for ABCD2, HEART and other calculators REFRESH Note before signing:1}  Medical Decision Making Amount and/or Complexity of Data Reviewed Radiology: ordered.  Risk Prescription drug management.   ***  {Document critical care time when appropriate  Document review of labs and clinical decision tools ie CHADS2VASC2, etc  Document your independent review of radiology images and any outside records  Document your discussion with family members, caretakers and with consultants  Document social determinants of health affecting pt's care  Document your decision making why or why not admission, treatments were needed:32947:::1}   Final diagnoses:  None    ED Discharge Orders     None

## 2024-04-28 NOTE — ED Triage Notes (Signed)
 Pt with daughter- pt reports mechanical fall today appx 1530 and fell into wooden railing.  Pt reports laceration to lower forearm. Dressing in place at time of triage. Pt denies head injury, LOC, blood thinner.

## 2024-04-28 NOTE — Discharge Instructions (Addendum)
 Soap and water daily.  Finish antibiotics.  Tylenol  for pain.  Hydrocodone  for significant pain.  Side effects include dizziness nausea constipation.  Follow-up with your primary care doctor.  I have also placed a consult into wound clinic and hopefully they will reach out to you for follow-up.  Return if any worsening or concerning symptoms.  Your blood pressure was also high here, please take your medication regularly and follow-up with your doctor regarding this

## 2024-05-01 ENCOUNTER — Encounter (HOSPITAL_BASED_OUTPATIENT_CLINIC_OR_DEPARTMENT_OTHER): Attending: General Surgery | Admitting: General Surgery

## 2024-05-01 DIAGNOSIS — L98495 Non-pressure chronic ulcer of skin of other sites with muscle involvement without evidence of necrosis: Secondary | ICD-10-CM | POA: Insufficient documentation

## 2024-05-01 DIAGNOSIS — S51812A Laceration without foreign body of left forearm, initial encounter: Secondary | ICD-10-CM | POA: Diagnosis not present

## 2024-05-11 ENCOUNTER — Encounter (HOSPITAL_BASED_OUTPATIENT_CLINIC_OR_DEPARTMENT_OTHER): Admitting: General Surgery

## 2024-05-11 DIAGNOSIS — S51812A Laceration without foreign body of left forearm, initial encounter: Secondary | ICD-10-CM | POA: Diagnosis not present

## 2024-05-11 DIAGNOSIS — L98495 Non-pressure chronic ulcer of skin of other sites with muscle involvement without evidence of necrosis: Secondary | ICD-10-CM | POA: Diagnosis not present

## 2024-05-18 ENCOUNTER — Ambulatory Visit: Payer: Self-pay

## 2024-05-18 ENCOUNTER — Ambulatory Visit (INDEPENDENT_AMBULATORY_CARE_PROVIDER_SITE_OTHER)

## 2024-05-18 ENCOUNTER — Encounter (HOSPITAL_BASED_OUTPATIENT_CLINIC_OR_DEPARTMENT_OTHER): Admitting: General Surgery

## 2024-05-18 VITALS — BP 166/64 | HR 83 | Temp 97.9°F | Ht <= 58 in | Wt 98.0 lb

## 2024-05-18 DIAGNOSIS — I10 Essential (primary) hypertension: Secondary | ICD-10-CM | POA: Diagnosis not present

## 2024-05-18 DIAGNOSIS — N1832 Chronic kidney disease, stage 3b: Secondary | ICD-10-CM | POA: Diagnosis not present

## 2024-05-18 DIAGNOSIS — L98495 Non-pressure chronic ulcer of skin of other sites with muscle involvement without evidence of necrosis: Secondary | ICD-10-CM | POA: Diagnosis not present

## 2024-05-18 DIAGNOSIS — M4692 Unspecified inflammatory spondylopathy, cervical region: Secondary | ICD-10-CM | POA: Insufficient documentation

## 2024-05-18 MED ORDER — HYDROCHLOROTHIAZIDE 25 MG PO TABS: 25.0000 mg | ORAL_TABLET | Freq: Every day | ORAL | 1 refills | Status: DC

## 2024-05-18 NOTE — Telephone Encounter (Signed)
 Patient declined to go to ED per Triage recommendation. C/o burning eyes, BL edema and headache, Bp this morning at 5 a.m. 171/71 p80, yesterday 169/70 p84. Does not take amlodipine . Only taking hydrochlorothiazide .

## 2024-05-18 NOTE — Assessment & Plan Note (Signed)
 Hypertension with lower extremity edema in the setting of chronic kidney disease Hypertension is not well-controlled with current medication regimen. Blood pressure readings at home have been consistently elevated, with readings as high as 171/71 mmHg. Lower extremity edema was noted with amlodipine  10 mg, leading to discontinuation.   Current regimen includes hydrochlorothiazide  25 mg, which helps with edema but not with blood pressure control. Chronic kidney disease limits the use of certain medications.   Discussed the potential reintroduction of amlodipine  5 mg, which previously did not cause edema at this dose.   Discussed the safety and efficacy of amlodipine , highlighting its minimal side effects and safety for kidney function. Emphasized the importance of not overly lowering blood pressure in elderly patients to avoid dizziness and falls. Target blood pressure is less than 150/90 mmHg.  PLAN: - Reintroduce amlodipine  5 mg daily, to be taken at night. - Continue hydrochlorothiazide  25 mg daily, to be taken in the morning. - Monitor blood pressure daily, preferably in the evening after resting for 10 minutes. - Maintain a log of blood pressure readings for the next week. - Send blood pressure log to the provider in two weeks. - Send prescription for hydrochlorothiazide  to [facility] for home delivery.   IF SHE DOES NOT HAVE GOOD BP CONTROL WITH COMBINATION OF hydrochlorothiazide  25 MG AND AMLODIPINE  5 MG, then could consider adding LOSARTAN 25 MG AND CUTTING THE DOSE OF hydrochlorothiazide  TO 12.5 MG in view of her CKD3.

## 2024-05-18 NOTE — Patient Instructions (Signed)
  VISIT SUMMARY: Today we discussed your uncontrolled blood pressure and leg swelling. We reviewed your current medications and made adjustments to better manage your blood pressure while minimizing side effects.  YOUR PLAN: HYPERTENSION: Your blood pressure has been consistently high despite medication adjustments. You have experienced side effects from some medications, including leg swelling and bloating. -Reintroduce amlodipine  5 mg daily, to be taken at night. -Continue hydrochlorothiazide  25 mg daily, to be taken in the morning. -Monitor your blood pressure daily, preferably in the evening after resting for 10 minutes. -Maintain a log of your blood pressure readings for the next week. -Send your blood pressure log to the provider in two weeks. -We will send a prescription for hydrochlorothiazide  to [facility] for home delivery.  PERIPHERAL EDEMA: You experienced significant leg and foot swelling with a higher dose of amlodipine , which improved after discontinuing the medication and starting hydrochlorothiazide . -Continue taking hydrochlorothiazide  25 mg daily to help manage the swelling.  FALL AND TRAUMATIC WOUND: You sustained a fall that resulted in a wound on your arm. The wound is currently being treated and is improving. -Continue receiving wound care at the wound care center.    Contains text generated by [employer]bridge.   Contains text generated by [employer]bridge.

## 2024-05-18 NOTE — Telephone Encounter (Signed)
 FYI Only or Action Required?: FYI only for provider.  Patient was last seen in primary care on 03/16/2024 by Sherre Clapper, MD.  Called Nurse Triage reporting Hypertension.  Symptoms began several days ago.  Interventions attempted: Prescription medications: Amlodipine , Hydrochlorothiazide .  Symptoms are: gradually worsening.  Triage Disposition: Go to ED Now (Notify PCP)  Patient/caregiver understands and will follow disposition?:               Copied from CRM [id number]. Topic: Clinical - Red Word Triage >> May 18, 2024  9:36 AM Berneda FALCON wrote: Red Word that prompted transfer to Nurse Triage: Patient had a bad accident 8/6  where she fell up a ramp and since then has had high bp 160/70 and this morning 170/71, needs to get med change, the legs and feet are swelling-hydrochlorothiazide  helped the swelling a little but not the higher blood pressure. Has a headache as well.  This has been ongoing since the accident Reason for Disposition  [1] Systolic BP >= 160 OR Diastolic >= 100 AND [2] cardiac (e.g., breathing difficulty, chest pain) or neurologic symptoms (e.g., new-onset blurred or double vision, unsteady gait)  Answer Assessment - Initial Assessment Questions 1. BLOOD PRESSURE: What is your blood pressure? Did you take at least two measurements 5 minutes apart?     0500-171/71 2. ONSET: When did you take your blood pressure?     Ongoing 3. HOW: How did you take your blood pressure? (e.g., automatic home BP monitor, visiting nurse)     At doctor visit this AM 4. HISTORY: Do you have a history of high blood pressure?     Yes 5. MEDICINES: Are you taking any medicines for blood pressure? Have you missed any doses recently?     Pt reports swelling occurs with Amlodipine  6. OTHER SYMPTOMS: Do you have any symptoms? (e.g., blurred vision, chest pain, difficulty breathing, headache, weakness)     Feet, legs swelling, headache 8/10, vision worse  Protocols  used: Blood Pressure - High-A-AH

## 2024-05-18 NOTE — Progress Notes (Signed)
 Acute Office Visit  Subjective:    Patient ID: Valerie Kelley, female    DOB: April 26, 1934, 89 y.o.   MRN: [id number]  Chief Complaint  Patient presents with   Hypertension    HPI: Discussed the use of AI scribe software for clinical note transcription with the patient, who gave verbal consent to proceed.  History of Present Illness   Valerie Kelley is an 89 year old female with hypertension who presents with uncontrolled blood pressure and leg swelling. She is accompanied by her daughter.  Hypertension and antihypertensive medication intolerance - Uncontrolled blood pressure despite medication adjustments - Home blood pressure readings consistently elevated (e.g., 171/71 mmHg, 169/84 mmHg) - Previously on amlodipine ; dose increased from 5 mg to 10 mg due to inadequate control - Amlodipine  at higher dose caused severe leg and foot swelling and bloating - Discontinued amlodipine  due to adverse effects - Started hydrochlorothiazide , which reduced swelling but did not adequately control blood pressure - Currently taking hydrochlorothiazide  25 mg - History of chronic kidney disease; cautious about excessive diuretic use - Monitors and logs blood pressure regularly - Experiences headaches when blood pressure is high - Feels tired and lightheaded with current medications  Peripheral edema - Significant leg and foot swelling developed with amlodipine , worsened at higher dose - Swelling improved after discontinuing amlodipine  and starting hydrochlorothiazide   Fall and traumatic wound - Sustained a fall while walking in her house - Resulted in a wound on her arm with exposed tendons and nerves - Currently receiving wound care at a wound care center - Wound is improving and was recently treated with a procedure to promote healing - No loss of consciousness during the fall       Past Medical History:  Diagnosis Date   Acquired hypothyroidism 02/24/2020   Allergic rhinitis     Anemia due to stage 3b chronic kidney disease (HCC) 05/22/2021   Arthritis    all over (04/15/2013)   Asthma    Chronic idiopathic constipation 02/24/2020   COPD (chronic obstructive pulmonary disease) (HCC)    DDD (degenerative disc disease), cervical 04/21/2018   Dyspnea    Exertional shortness of breath    Familial hyperlipidemia 02/24/2020   Gastroparesis 02/24/2020   GERD (gastroesophageal reflux disease)    H/O hiatal hernia    Hypertensive chronic kidney disease w stg 1-4/unsp chr kdny 08/24/2020   Lumbar radiculopathy 02/17/2021   Metabolic bone disease 01/03/2023   Moderate persistent asthma without complication 08/24/2020   Myalgia due to statin    Osteoarthritis of multiple joints 05/24/2016   Osteoporosis, post-menopausal 08/24/2020   Other fatigue 12/07/2021   Other headache syndrome 12/07/2021   Pain in joints 05/24/2016   Palpitations 07/21/2020   Paresthesia 12/07/2021   Spinal stenosis in cervical region 06/23/2018   Stage 3b chronic kidney disease (HCC) 12/23/2022   Stage III chronic kidney disease (HCC)     Past Surgical History:  Procedure Laterality Date   ABDOMINAL HYSTERECTOMY     CARPAL TUNNEL RELEASE Left 11/06/2021   CT release.   CERVICAL FUSION  04/21/2022   REVERSE SHOULDER ARTHROPLASTY Left 04/15/2013   REVERSE SHOULDER ARTHROPLASTY Left 04/15/2013   Procedure: REVERSE SHOULDER ARTHROPLASTY LEFT;  Surgeon: Elspeth JONELLE Her, MD;  Location: [facility] OR;  Service: Orthopedics;  Laterality: Left;   SINUS EXPLORATION     TONSILLECTOMY      Family History  Problem Relation Age of Onset   Stroke Mother    Cancer Brother  brain   Cancer Brother        leukemia   Cancer Brother        lung    Social History   Socioeconomic History   Marital status: Widowed    Spouse name: Not on file   Number of children: 2   Years of education: Not on file   Highest education level: Not on file  Occupational History   Occupation: Retired  Tobacco  Use   Smoking status: Never   Smokeless tobacco: Never  Vaping Use   Vaping status: Never Used  Substance and Sexual Activity   Alcohol use: No   Drug use: No   Sexual activity: Not Currently  Other Topics Concern   Not on file  Social History Narrative   Not on file   Social Drivers of Health   Financial Resource Strain: Low Risk  (12/17/2023)   Overall Financial Resource Strain (CARDIA)    Difficulty of Paying Living Expenses: Not hard at all  Food Insecurity: No Food Insecurity (12/17/2023)   Hunger Vital Sign    Worried About Running Out of Food in the Last Year: Never true    Ran Out of Food in the Last Year: Never true  Transportation Needs: No Transportation Needs (12/17/2023)   PRAPARE - Administrator, Civil Service (Medical): No    Lack of Transportation (Non-Medical): No  Physical Activity: Inactive (12/17/2023)   Exercise Vital Sign    Days of Exercise per Week: 0 days    Minutes of Exercise per Session: 0 min  Stress: No Stress Concern Present (12/17/2023)   [employer] of Occupational Health - Occupational Stress Questionnaire    Feeling of Stress : Not at all  Social Connections: Socially Isolated (12/17/2023)   Social Connection and Isolation Panel    Frequency of Communication with Friends and Family: More than three times a week    Frequency of Social Gatherings with Friends and Family: More than three times a week    Attends Religious Services: Never    Database administrator or Organizations: No    Attends Banker Meetings: Never    Marital Status: Widowed  Intimate Partner Violence: Not At Risk (12/17/2023)   Humiliation, Afraid, Rape, and Kick questionnaire    Fear of Current or Ex-Partner: No    Emotionally Abused: No    Physically Abused: No    Sexually Abused: No    Outpatient Medications Prior to Visit  Medication Sig Dispense Refill   acetaminophen  (TYLENOL ) 650 MG CR tablet Take 1 tablet (650 mg total) by mouth 3  (three) times daily as needed. 270 tablet 0   albuterol  (PROVENTIL  HFA;VENTOLIN  HFA) 108 (90 BASE) MCG/ACT inhaler Inhale 2 puffs into the lungs every 6 (six) hours as needed for wheezing. For wheezing     amLODipine  (NORVASC ) 5 MG tablet TAKE 1 TABLET BY MOUTH DAILY 90 tablet 3   b complex vitamins capsule Take 1 capsule by mouth daily.     Cholecalciferol  (VITAMIN D3) 125 MCG (5000 UT) TBDP Take 5,000 Units by mouth daily.     co-enzyme Q-10 30 MG capsule Take 30 mg by mouth daily.     DULoxetine  (CYMBALTA ) 20 MG capsule TAKE 1 CAPSULE BY MOUTH TWICE  DAILY 180 capsule 1   finasteride  (PROSCAR ) 5 MG tablet TAKE 1 TABLET BY MOUTH DAILY 90 tablet 1   fish oil-omega-3 fatty acids 1000 MG capsule Take 2 capsules by mouth every other  day. Patient using wild [place] salmon product with 2400 mg     Flaxseed Oil OIL 1 capsule by Does not apply route every other day.     fluticasone (FLONASE) 50 MCG/ACT nasal spray Place 1 spray into both nostrils daily as needed.     Glucosamine Sulfate 1000 MG CAPS Take 1 capsule by mouth daily. 500 mg tablets     montelukast (SINGULAIR) 10 MG tablet Take 10 mg by mouth daily.     Multiple Vitamins-Minerals (ALIVE WOMENS 50+) TABS Take 1 tablet by mouth daily at 12 noon.     omeprazole (PRILOSEC) 40 MG capsule Take 40 mg by mouth daily.     ondansetron  (ZOFRAN ) 4 MG tablet Take 1 tablet (4 mg total) by mouth every 8 (eight) hours as needed for nausea or vomiting. 15 tablet 0   polyethylene glycol (MIRALAX  / GLYCOLAX ) packet Take 51 g by mouth daily. Takes 3 capfuls daily     simvastatin  (ZOCOR ) 10 MG tablet 3 times a week 36 tablet 0   SYMBICORT 80-4.5 MCG/ACT inhaler Inhale 2 puffs into the lungs in the morning and at bedtime.     traZODone  (DESYREL ) 50 MG tablet TAKE 3 TABLETS BY MOUTH AT  BEDTIME 270 tablet 3   triamcinolone  ointment (KENALOG ) 0.1 % Apply 1 Application topically 3 (three) times daily.     vitamin E  400 UNIT capsule Take 400 Units by mouth daily.      cephALEXin  (KEFLEX ) 500 MG capsule Take 1 capsule (500 mg total) by mouth 3 (three) times daily. 21 capsule 0   hydrochlorothiazide  (HYDRODIURIL ) 25 MG tablet Take 1 tablet (25 mg total) by mouth daily. 90 tablet 1   HYDROcodone -acetaminophen  (NORCO/VICODIN) 5-325 MG tablet Take 1 tablet by mouth every 6 (six) hours as needed. 10 tablet 0   No facility-administered medications prior to visit.    Allergies  Allergen Reactions   Codeine Nausea And Vomiting and Nausea Only   Darvocet [Propoxyphene N-Acetaminophen ] Nausea And Vomiting   Glucosamine    Levothyroxine      Bone pain   Other Hives and Nausea And Vomiting   Prednisone Swelling and Other (See Comments)    Face swells and constant headache And headache   Pregabalin Other (See Comments)    Makes her feel drunk.   Shellfish Allergy Hives   Statins     Muscle pain.     Welchol [Colesevelam]     Muscle pain   Zetia  [Ezetimibe ]    Penicillins Rash and Other (See Comments)    Review of Systems  Constitutional:  Negative for chills, fatigue and fever.  HENT:  Negative for congestion, ear pain, sinus pressure and sore throat.   Eyes:  Positive for pain.  Respiratory:  Negative for cough and shortness of breath.   Cardiovascular:  Positive for leg swelling. Negative for chest pain.  Gastrointestinal:  Negative for abdominal pain, constipation, diarrhea, nausea and vomiting.  Genitourinary:  Negative for dysuria and frequency.  Musculoskeletal:  Negative for arthralgias, back pain and myalgias.  Neurological:  Positive for headaches. Negative for dizziness.  Psychiatric/Behavioral:  Negative for dysphoric mood. The patient is not nervous/anxious.        Objective:        05/18/2024   11:45 AM 05/18/2024   11:08 AM 04/28/2024    7:20 PM  Vitals with BMI  Height  4' 9   Weight  98 lbs   BMI  21.2   Systolic 166 156 788  Diastolic  64 68 63  Pulse  83     No data found.   Physical Exam Vitals and nursing note  reviewed.  Constitutional:      Appearance: Normal appearance.  HENT:     Head: Normocephalic and atraumatic.  Cardiovascular:     Rate and Rhythm: Normal rate and regular rhythm.  Pulmonary:     Effort: Pulmonary effort is normal.     Breath sounds: Normal breath sounds.  Musculoskeletal:        General: No swelling.  Skin:    Comments: Left forearm dressing noted  Neurological:     General: No focal deficit present.     Mental Status: She is alert and oriented to person, place, and time.  Psychiatric:        Mood and Affect: Mood normal.     There are no preventive care reminders to display for this patient.   There are no preventive care reminders to display for this patient.   Lab Results  Component Value Date   TSH 7.380 (H) 03/16/2024   Lab Results  Component Value Date   WBC 6.9 03/16/2024   HGB 12.6 03/16/2024   HCT 39.1 03/16/2024   MCV 94 03/16/2024   PLT 259 03/16/2024   Lab Results  Component Value Date   NA 136 03/16/2024   K 4.7 03/16/2024   CO2 22 03/16/2024   GLUCOSE 87 03/16/2024   BUN 19 03/16/2024   CREATININE 1.32 (H) 03/16/2024   BILITOT 0.3 03/16/2024   ALKPHOS 150 (H) 03/16/2024   AST 32 03/16/2024   ALT 35 (H) 03/16/2024   PROT 7.2 03/16/2024   ALBUMIN 4.5 03/16/2024   CALCIUM 9.4 03/16/2024   EGFR 39 (L) 03/16/2024   Lab Results  Component Value Date   CHOL 261 (H) 03/16/2024   Lab Results  Component Value Date   HDL 63 03/16/2024   Lab Results  Component Value Date   LDLCALC 168 (H) 03/16/2024   Lab Results  Component Value Date   TRIG 166 (H) 03/16/2024   Lab Results  Component Value Date   CHOLHDL 4.1 03/16/2024   No results found for: HGBA1C     Assessment & Plan:  Stage 1 hypertension Assessment & Plan: Hypertension with lower extremity edema in the setting of chronic kidney disease Hypertension is not well-controlled with current medication regimen. Blood pressure readings at home have been  consistently elevated, with readings as high as 171/71 mmHg. Lower extremity edema was noted with amlodipine  10 mg, leading to discontinuation.   Current regimen includes hydrochlorothiazide  25 mg, which helps with edema but not with blood pressure control. Chronic kidney disease limits the use of certain medications.   Discussed the potential reintroduction of amlodipine  5 mg, which previously did not cause edema at this dose.   Discussed the safety and efficacy of amlodipine , highlighting its minimal side effects and safety for kidney function. Emphasized the importance of not overly lowering blood pressure in elderly patients to avoid dizziness and falls. Target blood pressure is less than 150/90 mmHg.  PLAN: - Reintroduce amlodipine  5 mg daily, to be taken at night. - Continue hydrochlorothiazide  25 mg daily, to be taken in the morning. - Monitor blood pressure daily, preferably in the evening after resting for 10 minutes. - Maintain a log of blood pressure readings for the next week. - Send blood pressure log to the provider in two weeks. - Send prescription for hydrochlorothiazide  to [facility] for home delivery.  IF SHE DOES NOT HAVE GOOD BP CONTROL WITH COMBINATION OF hydrochlorothiazide  25 MG AND AMLODIPINE  5 MG, then could consider adding LOSARTAN 25 MG AND CUTTING THE DOSE OF hydrochlorothiazide  TO 12.5 MG in view of her CKD3.     CKD stage 3b, GFR 30-44 ml/min (HCC) STABLE.   Other orders -     hydroCHLOROthiazide ; Take 1 tablet (25 mg total) by mouth daily.  Dispense: 90 tablet; Refill: 1   Assessment and Plan            Meds ordered this encounter  Medications   hydrochlorothiazide  (HYDRODIURIL ) 25 MG tablet    Sig: Take 1 tablet (25 mg total) by mouth daily.    Dispense:  90 tablet    Refill:  1    No orders of the defined types were placed in this encounter.    Follow-up: No follow-ups on file.  An After Visit Summary was printed and given to the  patient.  Jaxston Chohan, MD [facility] [phone]

## 2024-05-26 ENCOUNTER — Encounter (HOSPITAL_BASED_OUTPATIENT_CLINIC_OR_DEPARTMENT_OTHER): Attending: General Surgery | Admitting: General Surgery

## 2024-05-26 DIAGNOSIS — L98495 Non-pressure chronic ulcer of skin of other sites with muscle involvement without evidence of necrosis: Secondary | ICD-10-CM | POA: Insufficient documentation

## 2024-06-01 ENCOUNTER — Ambulatory Visit (HOSPITAL_BASED_OUTPATIENT_CLINIC_OR_DEPARTMENT_OTHER): Admitting: General Surgery

## 2024-06-08 ENCOUNTER — Encounter (HOSPITAL_BASED_OUTPATIENT_CLINIC_OR_DEPARTMENT_OTHER): Admitting: General Surgery

## 2024-06-08 DIAGNOSIS — L98495 Non-pressure chronic ulcer of skin of other sites with muscle involvement without evidence of necrosis: Secondary | ICD-10-CM | POA: Diagnosis not present

## 2024-06-09 DIAGNOSIS — Z981 Arthrodesis status: Secondary | ICD-10-CM | POA: Diagnosis not present

## 2024-06-09 DIAGNOSIS — B0222 Postherpetic trigeminal neuralgia: Secondary | ICD-10-CM | POA: Diagnosis not present

## 2024-06-11 ENCOUNTER — Telehealth: Payer: Self-pay

## 2024-06-11 MED ORDER — LOSARTAN POTASSIUM 25 MG PO TABS: 25.0000 mg | ORAL_TABLET | Freq: Every day | ORAL | 0 refills | Status: DC

## 2024-06-11 NOTE — Telephone Encounter (Addendum)
 Left message informing patient per Dr. Sherre to continue amlodipine  5mg  and hydrochlorothiazide  25mg  daily and to start on losartan  25mg  daily.

## 2024-06-11 NOTE — Telephone Encounter (Signed)
 Returned patients call. Left message to call our office with her questions.

## 2024-06-11 NOTE — Telephone Encounter (Signed)
 Pt called back and has a few questions, please call when available.

## 2024-06-22 ENCOUNTER — Ambulatory Visit (HOSPITAL_BASED_OUTPATIENT_CLINIC_OR_DEPARTMENT_OTHER): Admitting: General Surgery

## 2024-06-28 NOTE — Assessment & Plan Note (Signed)
 SABRA

## 2024-06-28 NOTE — Progress Notes (Signed)
 "  Subjective:  Patient ID: Valerie Kelley, female    DOB: 06/10/1934  Age: 89 y.o. MRN: [id number]  Chief Complaint  Patient presents with   Medical Management of Chronic Issues    HPI: Discussed the use of AI scribe software for clinical note transcription with the patient, who gave verbal consent to proceed.  History of Present Illness Valerie Kelley is an 89 year old female with hypertension who presents for blood pressure management.  Blood pressure fluctuations and antihypertensive therapy - Blood pressure readings range from 128/64 to 161/68, with most values in the 130s to 160s systolic range. - Currently taking amlodipine  5 mg once daily and hydrochlorothiazide  25 mg once daily. - Losartan  25 mg once daily was prescribed but not started due to lack of pharmacy notification per patient report. - Prefers evening dosing of antihypertensive medications due to morning doses causing sluggishness. - No chest pain or shortness of breath.  Peripheral neuropathy following fall - Sustained a fall on September 6th resulting in nerve damage to the left arm. - Ongoing 'burning and stinging' sensations in the left arm. - Applies vitamin E  oil and rubs area lightly to avoid stimulating nerves. - Persistent nerve pain.  Hyperlipidemia and statin intolerance - LDL cholesterol 198 mg/dL, triglycerides 846 mg/dL, HDL 69 mg/dL, total cholesterol 701 mg/dL. - History of trialing several cholesterol-lowering medications with concerns about cost and side effects. - Not currently taking any cholesterol-lowering medication.  Chronic pulmonary symptoms and medication intolerance - History of lung issues managed with Singulair . - Unable to tolerate inhalers due to prolonged elevation of blood pressure after use.  Edema of the hands - Swelling in hands occurs after prolonged crafting activity. - Edema resolves after resting on the couch for approximately one hour.       12/17/2023   11:30 AM  12/14/2023   10:10 PM 06/25/2023    8:36 AM 12/20/2022    8:48 AM 11/21/2022   10:23 AM  Depression screen PHQ 2/9  Decreased Interest 0 0 0 0 0  Down, Depressed, Hopeless 0 0 0 0 0  PHQ - 2 Score 0 0 0 0 0        03/16/2024    8:11 AM  Fall Risk   Falls in the past year? 0  Number falls in past yr: 0  Injury with Fall? 0  Risk for fall due to : No Fall Risks  Follow up Falls evaluation completed    Patient Care Team: Sherre Clapper, MD as PCP - General (Family Medicine) Gregory Kent, MD as Referring Physician (Gastroenterology) Maryl Lynwood Raddle., OD (Optometry) Bonner Ade, MD as Consulting Physician (Physical Medicine and Rehabilitation) Chodri, Tanvir, MD as Referring Physician (Specialist) Lufadeju, Adetoye, MD as Referring Physician (Nephrology) Nyle Rankin POUR, [facility] (Inactive) (Pharmacist) Powers, Marsa POUR, MD as Referring Physician (Neurosurgery)   Review of Systems  Constitutional:  Negative for chills, diaphoresis, fatigue and fever.  HENT:  Negative for congestion, ear pain and sinus pain.   Eyes: Negative.   Respiratory:  Negative for cough and shortness of breath.   Cardiovascular:  Negative for chest pain.  Gastrointestinal:  Negative for abdominal pain, constipation, diarrhea, nausea and vomiting.  Endocrine: Negative.   Genitourinary:  Negative for dysuria, frequency and urgency.  Musculoskeletal:  Negative for arthralgias.  Skin: Negative.   Allergic/Immunologic: Negative.   Neurological:  Negative for dizziness, weakness, light-headedness and headaches.  Hematological: Negative.   Psychiatric/Behavioral:  Negative for dysphoric mood. The patient is  not nervous/anxious.     Current Outpatient Medications on File Prior to Visit  Medication Sig Dispense Refill   acetaminophen  (TYLENOL ) 650 MG CR tablet Take 1 tablet (650 mg total) by mouth 3 (three) times daily as needed. 270 tablet 0   b complex vitamins capsule Take 1 capsule by mouth daily.      Cholecalciferol  (VITAMIN D3) 125 MCG (5000 UT) TBDP Take 5,000 Units by mouth daily.     co-enzyme Q-10 30 MG capsule Take 30 mg by mouth daily.     DULoxetine  (CYMBALTA ) 20 MG capsule TAKE 1 CAPSULE BY MOUTH TWICE  DAILY 180 capsule 1   finasteride  (PROSCAR ) 5 MG tablet TAKE 1 TABLET BY MOUTH DAILY 90 tablet 1   fish oil-omega-3 fatty acids 1000 MG capsule Take 2 capsules by mouth every other day. Patient using wild alaskan salmon product with 2400 mg     Flaxseed Oil OIL 1 capsule by Does not apply route every other day.     fluticasone (FLONASE) 50 MCG/ACT nasal spray Place 1 spray into both nostrils daily as needed.     Glucosamine Sulfate 1000 MG CAPS Take 1 capsule by mouth daily. 500 mg tablets     Multiple Vitamins-Minerals (ALIVE WOMENS 50+) TABS Take 1 tablet by mouth daily at 12 noon.     omeprazole (PRILOSEC) 40 MG capsule Take 40 mg by mouth daily.     ondansetron  (ZOFRAN ) 4 MG tablet Take 1 tablet (4 mg total) by mouth every 8 (eight) hours as needed for nausea or vomiting. 15 tablet 0   polyethylene glycol (MIRALAX  / GLYCOLAX ) packet Take 51 g by mouth daily. Takes 3 capfuls daily     simvastatin  (ZOCOR ) 10 MG tablet 3 times a week 36 tablet 0   traZODone  (DESYREL ) 50 MG tablet TAKE 3 TABLETS BY MOUTH AT  BEDTIME 270 tablet 3   triamcinolone  ointment (KENALOG ) 0.1 % Apply 1 Application topically 3 (three) times daily.     vitamin E  400 UNIT capsule Take 400 Units by mouth daily.     No current facility-administered medications on file prior to visit.   Past Medical History:  Diagnosis Date   Acquired hypothyroidism 02/24/2020   Allergic rhinitis    Anemia due to stage 3b chronic kidney disease (HCC) 05/22/2021   Arthritis    all over (04/15/2013)   Asthma    Chronic idiopathic constipation 02/24/2020   COPD (chronic obstructive pulmonary disease) (HCC)    DDD (degenerative disc disease), cervical 04/21/2018   Dyspnea    Exertional shortness of breath    Familial  hyperlipidemia 02/24/2020   Gastroparesis 02/24/2020   GERD (gastroesophageal reflux disease)    H/O hiatal hernia    Hypertensive chronic kidney disease w stg 1-4/unsp chr kdny 08/24/2020   Lumbar radiculopathy 02/17/2021   Metabolic bone disease 01/03/2023   Moderate persistent asthma without complication 08/24/2020   Myalgia due to statin    Osteoarthritis of multiple joints 05/24/2016   Osteoporosis, post-menopausal 08/24/2020   Other fatigue 12/07/2021   Other headache syndrome 12/07/2021   Pain in joints 05/24/2016   Palpitations 07/21/2020   Paresthesia 12/07/2021   Spinal stenosis in cervical region 06/23/2018   Stage 3b chronic kidney disease (HCC) 12/23/2022   Stage III chronic kidney disease (HCC)    Past Surgical History:  Procedure Laterality Date   ABDOMINAL HYSTERECTOMY     CARPAL TUNNEL RELEASE Left 11/06/2021   CT release.   CERVICAL FUSION  04/21/2022  REVERSE SHOULDER ARTHROPLASTY Left 04/15/2013   REVERSE SHOULDER ARTHROPLASTY Left 04/15/2013   Procedure: REVERSE SHOULDER ARTHROPLASTY LEFT;  Surgeon: Elspeth JONELLE Her, MD;  Location: [facility] OR;  Service: Orthopedics;  Laterality: Left;   SINUS EXPLORATION     TONSILLECTOMY      Family History  Problem Relation Age of Onset   Stroke Mother    Cancer Brother        brain   Cancer Brother        leukemia   Cancer Brother        lung   Social History   Socioeconomic History   Marital status: Widowed    Spouse name: Not on file   Number of children: 2   Years of education: Not on file   Highest education level: Not on file  Occupational History   Occupation: Retired  Tobacco Use   Smoking status: Never   Smokeless tobacco: Never  Vaping Use   Vaping status: Never Used  Substance and Sexual Activity   Alcohol use: No   Drug use: No   Sexual activity: Not Currently  Other Topics Concern   Not on file  Social History Narrative   Not on file   Social Drivers of Health   Financial Resource  Strain: Low Risk  (12/17/2023)   Overall Financial Resource Strain (CARDIA)    Difficulty of Paying Living Expenses: Not hard at all  Food Insecurity: No Food Insecurity (12/17/2023)   Hunger Vital Sign    Worried About Running Out of Food in the Last Year: Never true    Ran Out of Food in the Last Year: Never true  Transportation Needs: No Transportation Needs (12/17/2023)   PRAPARE - Administrator, Civil Service (Medical): No    Lack of Transportation (Non-Medical): No  Physical Activity: Inactive (12/17/2023)   Exercise Vital Sign    Days of Exercise per Week: 0 days    Minutes of Exercise per Session: 0 min  Stress: No Stress Concern Present (12/17/2023)   [employer] of Occupational Health - Occupational Stress Questionnaire    Feeling of Stress : Not at all  Social Connections: Socially Isolated (12/17/2023)   Social Connection and Isolation Panel    Frequency of Communication with Friends and Family: More than three times a week    Frequency of Social Gatherings with Friends and Family: More than three times a week    Attends Religious Services: Never    Database Administrator or Organizations: No    Attends Banker Meetings: Never    Marital Status: Widowed    Objective:  BP (!) 152/78   Pulse 73   Temp 97.8 F (36.6 C) (Temporal)   Resp 16   Ht 4' 9 (1.448 m)   Wt 97 lb (44 kg)   SpO2 99%   BMI 20.99 kg/m      06/29/2024    8:20 AM 05/18/2024   11:45 AM 05/18/2024   11:08 AM  BP/Weight  Systolic BP 152 166 156  Diastolic BP 78 64 68  Wt. (Lbs) 97  98  BMI 20.99 kg/m2  21.21 kg/m2    Physical Exam Vitals reviewed.  Constitutional:      Appearance: Normal appearance. She is normal weight.  Neck:     Vascular: No carotid bruit.  Cardiovascular:     Rate and Rhythm: Normal rate and regular rhythm.     Heart sounds: Normal heart sounds.  Pulmonary:  Effort: Pulmonary effort is normal. No respiratory distress.     Breath  sounds: Normal breath sounds.  Abdominal:     General: Abdomen is flat. Bowel sounds are normal.     Palpations: Abdomen is soft.     Tenderness: There is no abdominal tenderness.  Neurological:     Mental Status: She is alert and oriented to person, place, and time.  Psychiatric:        Mood and Affect: Mood normal.        Behavior: Behavior normal.         Lab Results  Component Value Date   WBC 6.2 06/29/2024   HGB 12.8 06/29/2024   HCT 39.8 06/29/2024   PLT 273 06/29/2024   GLUCOSE 89 06/29/2024   CHOL 261 (H) 03/16/2024   TRIG 166 (H) 03/16/2024   HDL 63 03/16/2024   LDLCALC 168 (H) 03/16/2024   ALT 19 06/29/2024   AST 23 06/29/2024   NA 136 06/29/2024   K 3.7 06/29/2024   CL 97 06/29/2024   CREATININE 1.38 (H) 06/29/2024   BUN 20 06/29/2024   CO2 24 06/29/2024   TSH 3.790 06/29/2024    Results for orders placed or performed in visit on 06/29/24  POCT Lipid Panel   Collection Time: 06/29/24  8:47 AM  Result Value Ref Range   TC 298    HDL 69    TRG 153    LDL 198    Non-HDL 229    TC/HDL 2.9   VITAMIN D  25 Hydroxy (Vit-D Deficiency, Fractures)   Collection Time: 06/29/24  9:23 AM  Result Value Ref Range   Vit D, 25-Hydroxy 37.7 30.0 - 100.0 ng/mL  CBC with Differential/Platelet   Collection Time: 06/29/24  9:24 AM  Result Value Ref Range   WBC 6.2 3.4 - 10.8 x10E3/uL   RBC 4.23 3.77 - 5.28 x10E6/uL   Hemoglobin 12.8 11.1 - 15.9 g/dL   Hematocrit 60.1 65.9 - 46.6 %   MCV 94 79 - 97 fL   MCH 30.3 26.6 - 33.0 pg   MCHC 32.2 31.5 - 35.7 g/dL   RDW 86.5 88.2 - 84.5 %   Platelets 273 150 - 450 x10E3/uL   Neutrophils 54 Not Estab. %   Lymphs 32 Not Estab. %   Monocytes 13 Not Estab. %   Eos 1 Not Estab. %   Basos 0 Not Estab. %   Neutrophils Absolute 3.3 1.4 - 7.0 x10E3/uL   Lymphocytes Absolute 2.0 0.7 - 3.1 x10E3/uL   Monocytes Absolute 0.8 0.1 - 0.9 x10E3/uL   EOS (ABSOLUTE) 0.1 0.0 - 0.4 x10E3/uL   Basophils Absolute 0.0 0.0 - 0.2 x10E3/uL    Immature Granulocytes 0 Not Estab. %   Immature Grans (Abs) 0.0 0.0 - 0.1 x10E3/uL  Comprehensive metabolic panel with GFR   Collection Time: 06/29/24  9:24 AM  Result Value Ref Range   Glucose 89 70 - 99 mg/dL   BUN 20 8 - 27 mg/dL   Creatinine, Ser 8.61 (H) 0.57 - 1.00 mg/dL   eGFR 37 (L) >40 fO/fpw/8.26   BUN/Creatinine Ratio 14 12 - 28   Sodium 136 134 - 144 mmol/L   Potassium 3.7 3.5 - 5.2 mmol/L   Chloride 97 96 - 106 mmol/L   CO2 24 20 - 29 mmol/L   Calcium 9.8 8.7 - 10.3 mg/dL   Total Protein 7.5 6.0 - 8.5 g/dL   Albumin 4.8 (H) 3.7 - 4.7 g/dL   Globulin,  Total 2.7 1.5 - 4.5 g/dL   Bilirubin Total 0.5 0.0 - 1.2 mg/dL   Alkaline Phosphatase 129 48 - 129 IU/L   AST 23 0 - 40 IU/L   ALT 19 0 - 32 IU/L  TSH   Collection Time: 06/29/24  9:24 AM  Result Value Ref Range   TSH 3.790 0.450 - 4.500 uIU/mL  .  Assessment & Plan:   Assessment & Plan Stage 1 hypertension Blood pressure mostly in 130s to 160s.  - Prescribe combination pill with amlodipine  5 mg, hydrochlorothiazide  12.5 mg, and olmesartan  20 mg daily. - Discontinue individual medications amlodipine  and hydrochlorothiazide . Losartan  not started due to pharmacy issues. - Send prescription to [facility]. - Monitor blood pressure regularly and report readings. Orders:   CBC with Differential/Platelet   Comprehensive metabolic panel with GFR   Olmesartan -amLODIPine -HCTZ 20-5-12.5 MG TABS; Take 1 tablet by mouth daily.  Simple chronic bronchitis (HCC) Managed with Singulair  due to inhaler intolerance. - Prescribe Singulair  for one year and send to [facility] pharmacy. Orders:   montelukast  (SINGULAIR ) 10 MG tablet; Take 1 tablet (10 mg total) by mouth daily.  Acquired hypothyroidism Check thyroid  levels.  Anticipate patient refusing thyroid  medications. Orders:   TSH   Familial hyperlipidemia LDL elevated at 198 mg/dL. Triglycerides improved. Total cholesterol high. Discussed non-statin options due to cost and statin  intolerance. - Offer Nexletol samples for trial.  Refused at this time.  Education given. - Discuss patient assistance programs for medication affordability. - Consider pharmacist referral for medication cost assistance if Nexletol tolerated. Orders:   POCT Lipid Panel  Mild vitamin D  deficiency  Orders:   VITAMIN D  25 Hydroxy (Vit-D Deficiency, Fractures)  Encounter for immunization  Orders:   Flu vaccine HIGH DOSE PF(Fluzone Trivalent)  Encounter for immunization  Orders:   Pfizer Comirnaty Covid-19 Vaccine 79yrs & older    Body mass index is 20.99 kg/m.    Meds ordered this encounter  Medications   Olmesartan -amLODIPine -HCTZ 20-5-12.5 MG TABS    Sig: Take 1 tablet by mouth daily.    Dispense:  30 tablet    Refill:  1   montelukast  (SINGULAIR ) 10 MG tablet    Sig: Take 1 tablet (10 mg total) by mouth daily.    Dispense:  90 tablet    Refill:  3    Orders Placed This Encounter  Procedures   Flu vaccine HIGH DOSE PF(Fluzone Trivalent)   Pfizer Comirnaty Covid-19 Vaccine 27yrs & older   CBC with Differential/Platelet   Comprehensive metabolic panel with GFR   TSH   VITAMIN D  25 Hydroxy (Vit-D Deficiency, Fractures)   POCT Lipid Panel     I,Marla I Leal-Borjas,acting as a scribe for Abigail Free, MD.,have documented all relevant documentation on the behalf of Abigail Free, MD,as directed by  Abigail Free, MD while in the presence of Abigail Free, MD.    Follow-up: Return in about 3 months (around 09/29/2024).  An After Visit Summary was printed and given to the patient.  I attest that I have reviewed this visit and agree with the plan scribed by my staff.   Abigail Free, MD [facility] [phone]    "

## 2024-06-28 NOTE — Assessment & Plan Note (Signed)
  Orders:   POCT Lipid Panel

## 2024-06-29 ENCOUNTER — Encounter: Payer: Self-pay | Admitting: Family Medicine

## 2024-06-29 ENCOUNTER — Ambulatory Visit (INDEPENDENT_AMBULATORY_CARE_PROVIDER_SITE_OTHER): Admitting: Family Medicine

## 2024-06-29 VITALS — BP 152/78 | HR 73 | Temp 97.8°F | Resp 16 | Ht <= 58 in | Wt 97.0 lb

## 2024-06-29 DIAGNOSIS — E7849 Other hyperlipidemia: Secondary | ICD-10-CM | POA: Diagnosis not present

## 2024-06-29 DIAGNOSIS — E559 Vitamin D deficiency, unspecified: Secondary | ICD-10-CM

## 2024-06-29 DIAGNOSIS — I10 Essential (primary) hypertension: Secondary | ICD-10-CM

## 2024-06-29 DIAGNOSIS — Z23 Encounter for immunization: Secondary | ICD-10-CM | POA: Diagnosis not present

## 2024-06-29 DIAGNOSIS — E039 Hypothyroidism, unspecified: Secondary | ICD-10-CM

## 2024-06-29 DIAGNOSIS — J410 Simple chronic bronchitis: Secondary | ICD-10-CM

## 2024-06-29 LAB — POCT LIPID PANEL
HDL: 69
LDL: 198
Non-HDL: 229
TC/HDL: 2.9
TC: 298
TRG: 153

## 2024-06-29 MED ORDER — MONTELUKAST SODIUM 10 MG PO TABS
10.0000 mg | ORAL_TABLET | Freq: Every day | ORAL | 3 refills | Status: AC
Start: 2024-06-29 — End: ?

## 2024-06-29 MED ORDER — OLMESARTAN-AMLODIPINE-HCTZ 20-5-12.5 MG PO TABS: 1.0000 | ORAL_TABLET | Freq: Every day | ORAL | 1 refills | Status: DC

## 2024-06-29 NOTE — Patient Instructions (Addendum)
  VISIT SUMMARY: Today, we addressed your blood pressure management, nerve pain in your left arm, high cholesterol, chronic lung issues, and hand swelling. We also discussed routine vaccinations and necessary blood tests.  YOUR PLAN: BLOOD PRESSURE MANAGEMENT: Your blood pressure readings have been fluctuating, and we need to adjust your medications. -I am going to give you a combined pill which contains amlodipine  to 5 m, hydrochlorothiazide  to 12.5 mg daily and olmesartan 20 mg daily.  - STOP THE INDIVIDUAL PRESCRIPTIONS OF AMLODIPINE  AND HYDROCHLOROTHIAZIDE .  -The prescription will be sent to [facility]. -Please monitor your blood pressure regularly and report your readings in 3 weeks  NERVE PAIN IN LEFT ARM: You have ongoing nerve pain in your left arm following a fall. -Continue applying vitamin E  oil and rubbing the area lightly.  HIGH CHOLESTEROL: Your LDL cholesterol is high, and you have had issues with statin medications. -I have provided you information concerning Nexletol to review. Please let us  know if you would like to try itt.  -Discussed patient assistance programs to help with medication costs. -Consider a referral to a pharmacist for medication cost assistance if Nexletol is tolerated.  CHRONIC LUNG ISSUES: You have chronic lung issues managed with Singulair. -We will prescribe Singulair for one year and send the prescription to [facility] pharmacy.   GENERAL HEALTH MAINTENANCE: You are due for routine vaccinations and blood work. -Administered flu vaccine. -Administered COVID booster vaccine. -Order blood tests for kidney function, thyroid  function, CBC, liver function, and vitamin D  levels.                      Contains text generated by Abridge.                                 Contains text generated by Abridge.

## 2024-06-30 ENCOUNTER — Ambulatory Visit: Payer: Self-pay | Admitting: Family Medicine

## 2024-06-30 LAB — TSH: TSH: 3.79 u[IU]/mL (ref 0.450–4.500)

## 2024-06-30 LAB — CBC WITH DIFFERENTIAL/PLATELET
Basophils Absolute: 0 x10E3/uL (ref 0.0–0.2)
Basos: 0 %
EOS (ABSOLUTE): 0.1 x10E3/uL (ref 0.0–0.4)
Eos: 1 %
Hematocrit: 39.8 % (ref 34.0–46.6)
Hemoglobin: 12.8 g/dL (ref 11.1–15.9)
Immature Grans (Abs): 0 x10E3/uL (ref 0.0–0.1)
Immature Granulocytes: 0 %
Lymphocytes Absolute: 2 x10E3/uL (ref 0.7–3.1)
Lymphs: 32 %
MCH: 30.3 pg (ref 26.6–33.0)
MCHC: 32.2 g/dL (ref 31.5–35.7)
MCV: 94 fL (ref 79–97)
Monocytes Absolute: 0.8 x10E3/uL (ref 0.1–0.9)
Monocytes: 13 %
Neutrophils Absolute: 3.3 x10E3/uL (ref 1.4–7.0)
Neutrophils: 54 %
Platelets: 273 x10E3/uL (ref 150–450)
RBC: 4.23 x10E6/uL (ref 3.77–5.28)
RDW: 13.4 % (ref 11.7–15.4)
WBC: 6.2 x10E3/uL (ref 3.4–10.8)

## 2024-06-30 LAB — COMPREHENSIVE METABOLIC PANEL WITH GFR
ALT: 19 IU/L (ref 0–32)
AST: 23 IU/L (ref 0–40)
Albumin: 4.8 g/dL — ABNORMAL HIGH (ref 3.7–4.7)
Alkaline Phosphatase: 129 IU/L (ref 48–129)
BUN/Creatinine Ratio: 14 (ref 12–28)
BUN: 20 mg/dL (ref 8–27)
Bilirubin Total: 0.5 mg/dL (ref 0.0–1.2)
CO2: 24 mmol/L (ref 20–29)
Calcium: 9.8 mg/dL (ref 8.7–10.3)
Chloride: 97 mmol/L (ref 96–106)
Creatinine, Ser: 1.38 mg/dL — ABNORMAL HIGH (ref 0.57–1.00)
Globulin, Total: 2.7 g/dL (ref 1.5–4.5)
Glucose: 89 mg/dL (ref 70–99)
Potassium: 3.7 mmol/L (ref 3.5–5.2)
Sodium: 136 mmol/L (ref 134–144)
Total Protein: 7.5 g/dL (ref 6.0–8.5)
eGFR: 37 mL/min/1.73 — ABNORMAL LOW (ref >59)

## 2024-06-30 LAB — VITAMIN D 25 HYDROXY (VIT D DEFICIENCY, FRACTURES): Vit D, 25-Hydroxy: 37.7 ng/mL (ref 30.0–100.0)

## 2024-07-01 DIAGNOSIS — E559 Vitamin D deficiency, unspecified: Secondary | ICD-10-CM | POA: Insufficient documentation

## 2024-07-01 NOTE — Assessment & Plan Note (Signed)
  Orders:   VITAMIN D  25 Hydroxy (Vit-D Deficiency, Fractures)

## 2024-07-26 ENCOUNTER — Telehealth: Payer: Self-pay | Admitting: Family Medicine

## 2024-07-26 NOTE — Telephone Encounter (Signed)
 Patient brought in bp log. See photo below.  On 06/29/2024 I changed her amlodipine  and losartan  to olmesartan-amlodipine -hydrochlorothiazide  to 20/5/12.5 mg once daily.  It looks like she felt it was too strong from 10/7-10/10. She experienced insomnia and nausea.  She then went back on amlodipine  5 mg daily and bp increased from 10/11-13. She then went on olmesartan (unsure what dose and 1/2 pill daily.) also unsure if she kept taking amlodipine . I have not prescribed her olmesartan alone, so maybe she split the combo pill.  BP appars to be well controlled on this current combination.  Clarify with the patient and please send requested [facility].  Thank you,Dr. Sherre

## 2024-07-27 ENCOUNTER — Other Ambulatory Visit: Payer: Self-pay

## 2024-07-27 DIAGNOSIS — I10 Essential (primary) hypertension: Secondary | ICD-10-CM

## 2024-07-27 MED ORDER — OLMESARTAN-AMLODIPINE-HCTZ 20-5-12.5 MG PO TABS: 0.5000 | ORAL_TABLET | Freq: Every day | ORAL | 1 refills | Status: AC

## 2024-07-27 NOTE — Telephone Encounter (Signed)
 GOOD. DR. SHERRE

## 2024-07-27 NOTE — Telephone Encounter (Signed)
 Called patient she stated she is taking 0.5 of the olmesartan-amlodipine -hydrochlorothiazide  daily, sent rx to requested pharmacy.

## 2024-08-17 ENCOUNTER — Other Ambulatory Visit: Payer: Self-pay | Admitting: Family Medicine

## 2024-09-07 ENCOUNTER — Other Ambulatory Visit: Payer: Self-pay | Admitting: Family Medicine

## 2024-09-07 DIAGNOSIS — L650 Telogen effluvium: Secondary | ICD-10-CM

## 2024-09-30 ENCOUNTER — Ambulatory Visit: Admitting: Family Medicine
